# Patient Record
Sex: Female | Born: 1950 | Race: White | Hispanic: No | Marital: Married | Smoking: Never smoker
Health system: Southern US, Community
[De-identification: ages and names within clinical notes are randomized; demographics above are authoritative.]

## PROBLEM LIST (undated history)

## (undated) DIAGNOSIS — B009 Herpesviral infection, unspecified: Secondary | ICD-10-CM

## (undated) DIAGNOSIS — K219 Gastro-esophageal reflux disease without esophagitis: Secondary | ICD-10-CM

## (undated) DIAGNOSIS — M1990 Unspecified osteoarthritis, unspecified site: Secondary | ICD-10-CM

## (undated) DIAGNOSIS — E785 Hyperlipidemia, unspecified: Secondary | ICD-10-CM

## (undated) DIAGNOSIS — H8109 Meniere's disease, unspecified ear: Secondary | ICD-10-CM

## (undated) HISTORY — DX: Gastro-esophageal reflux disease without esophagitis: K21.9

## (undated) HISTORY — DX: Herpesviral infection, unspecified: B00.9

## (undated) HISTORY — DX: Hyperlipidemia, unspecified: E78.5

## (undated) HISTORY — PX: TONSILLECTOMY: SUR1361

---

## 2013-08-01 ENCOUNTER — Ambulatory Visit (INDEPENDENT_AMBULATORY_CARE_PROVIDER_SITE_OTHER): Payer: BC Managed Care – PPO | Admitting: Internal Medicine

## 2013-08-01 ENCOUNTER — Encounter: Payer: Self-pay | Admitting: Internal Medicine

## 2013-08-01 VITALS — BP 114/60 | HR 83 | Temp 98.3°F | Ht 58.5 in | Wt 148.8 lb

## 2013-08-01 DIAGNOSIS — F411 Generalized anxiety disorder: Secondary | ICD-10-CM | POA: Insufficient documentation

## 2013-08-01 DIAGNOSIS — H8109 Meniere's disease, unspecified ear: Secondary | ICD-10-CM

## 2013-08-01 DIAGNOSIS — I10 Essential (primary) hypertension: Secondary | ICD-10-CM | POA: Insufficient documentation

## 2013-08-01 DIAGNOSIS — E785 Hyperlipidemia, unspecified: Secondary | ICD-10-CM

## 2013-08-01 DIAGNOSIS — E669 Obesity, unspecified: Secondary | ICD-10-CM

## 2013-08-01 DIAGNOSIS — K219 Gastro-esophageal reflux disease without esophagitis: Secondary | ICD-10-CM | POA: Insufficient documentation

## 2013-08-01 MED ORDER — ROSUVASTATIN CALCIUM 20 MG PO TABS: 20.0000 mg | ORAL_TABLET | Freq: Every day | ORAL | Status: DC

## 2013-08-01 MED ORDER — HYDROCHLOROTHIAZIDE 25 MG PO TABS
25.0000 mg | ORAL_TABLET | Freq: Every day | ORAL | Status: DC
Start: 2013-08-01 — End: 2014-02-02

## 2013-08-01 NOTE — Assessment & Plan Note (Signed)
Encouraged her to work on diet and exercise 

## 2013-08-01 NOTE — Assessment & Plan Note (Signed)
Controlled on HCTZ and valium She request refill of HCTZ today

## 2013-08-01 NOTE — Assessment & Plan Note (Signed)
No issues on crestor She request refill today

## 2013-08-01 NOTE — Patient Instructions (Addendum)
Fat and Cholesterol Control Diet  Fat and cholesterol levels in your blood and organs are influenced by your diet. High levels of fat and cholesterol may lead to diseases of the heart, small and large blood vessels, gallbladder, liver, and pancreas.  CONTROLLING FAT AND CHOLESTEROL WITH DIET  Although exercise and lifestyle factors are important, your diet is key. That is because certain foods are known to raise cholesterol and others to lower it. The goal is to balance foods for their effect on cholesterol and more importantly, to replace saturated and trans fat with other types of fat, such as monounsaturated fat, polyunsaturated fat, and omega-3 fatty acids.  On average, a person should consume no more than 15 to 17 g of saturated fat daily. Saturated and trans fats are considered "bad" fats, and they will raise LDL cholesterol. Saturated fats are primarily found in animal products such as meats, butter, and cream. However, that does not mean you need to give up all your favorite foods. Today, there are good tasting, low-fat, low-cholesterol substitutes for most of the things you like to eat. Choose low-fat or nonfat alternatives. Choose round or loin cuts of red meat. These types of cuts are lowest in fat and cholesterol. Chicken (without the skin), fish, veal, and ground turkey breast are great choices. Eliminate fatty meats, such as hot dogs and salami. Even shellfish have little or no saturated fat. Have a 3 oz (85 g) portion when you eat lean meat, poultry, or fish.  Trans fats are also called "partially hydrogenated oils." They are oils that have been scientifically manipulated so that they are solid at room temperature resulting in a longer shelf life and improved taste and texture of foods in which they are added. Trans fats are found in stick margarine, some tub margarines, cookies, crackers, and baked goods.   When baking and cooking, oils are a great substitute for butter. The monounsaturated oils are  especially beneficial since it is believed they lower LDL and raise HDL. The oils you should avoid entirely are saturated tropical oils, such as coconut and palm.   Remember to eat a lot from food groups that are naturally free of saturated and trans fat, including fish, fruit, vegetables, beans, grains (barley, rice, couscous, bulgur wheat), and pasta (without cream sauces).   IDENTIFYING FOODS THAT LOWER FAT AND CHOLESTEROL   Soluble fiber may lower your cholesterol. This type of fiber is found in fruits such as apples, vegetables such as broccoli, potatoes, and carrots, legumes such as beans, peas, and lentils, and grains such as barley. Foods fortified with plant sterols (phytosterol) may also lower cholesterol. You should eat at least 2 g per day of these foods for a cholesterol lowering effect.   Read package labels to identify low-saturated fats, trans fat free, and low-fat foods at the supermarket. Select cheeses that have only 2 to 3 g saturated fat per ounce. Use a heart-healthy tub margarine that is free of trans fats or partially hydrogenated oil. When buying baked goods (cookies, crackers), avoid partially hydrogenated oils. Breads and muffins should be made from whole grains (whole-wheat or whole oat flour, instead of "flour" or "enriched flour"). Buy non-creamy canned soups with reduced salt and no added fats.   FOOD PREPARATION TECHNIQUES   Never deep-fry. If you must fry, either stir-fry, which uses very little fat, or use non-stick cooking sprays. When possible, broil, bake, or roast meats, and steam vegetables. Instead of putting butter or margarine on vegetables, use lemon   and herbs, applesauce, and cinnamon (for squash and sweet potatoes). Use nonfat yogurt, salsa, and low-fat dressings for salads.   LOW-SATURATED FAT / LOW-FAT FOOD SUBSTITUTES  Meats / Saturated Fat (g)  · Avoid: Steak, marbled (3 oz/85 g) / 11 g  · Choose: Steak, lean (3 oz/85 g) / 4 g  · Avoid: Hamburger (3 oz/85 g) / 7  g  · Choose: Hamburger, lean (3 oz/85 g) / 5 g  · Avoid: Ham (3 oz/85 g) / 6 g  · Choose: Ham, lean cut (3 oz/85 g) / 2.4 g  · Avoid: Chicken, with skin, dark meat (3 oz/85 g) / 4 g  · Choose: Chicken, skin removed, dark meat (3 oz/85 g) / 2 g  · Avoid: Chicken, with skin, light meat (3 oz/85 g) / 2.5 g  · Choose: Chicken, skin removed, light meat (3 oz/85 g) / 1 g  Dairy / Saturated Fat (g)  · Avoid: Whole milk (1 cup) / 5 g  · Choose: Low-fat milk, 2% (1 cup) / 3 g  · Choose: Low-fat milk, 1% (1 cup) / 1.5 g  · Choose: Skim milk (1 cup) / 0.3 g  · Avoid: Hard cheese (1 oz/28 g) / 6 g  · Choose: Skim milk cheese (1 oz/28 g) / 2 to 3 g  · Avoid: Cottage cheese, 4% fat (1 cup) / 6.5 g  · Choose: Low-fat cottage cheese, 1% fat (1 cup) / 1.5 g  · Avoid: Ice cream (1 cup) / 9 g  · Choose: Sherbet (1 cup) / 2.5 g  · Choose: Nonfat frozen yogurt (1 cup) / 0.3 g  · Choose: Frozen fruit bar / trace  · Avoid: Whipped cream (1 tbs) / 3.5 g  · Choose: Nondairy whipped topping (1 tbs) / 1 g  Condiments / Saturated Fat (g)  · Avoid: Mayonnaise (1 tbs) / 2 g  · Choose: Low-fat mayonnaise (1 tbs) / 1 g  · Avoid: Butter (1 tbs) / 7 g  · Choose: Extra light margarine (1 tbs) / 1 g  · Avoid: Coconut oil (1 tbs) / 11.8 g  · Choose: Olive oil (1 tbs) / 1.8 g  · Choose: Corn oil (1 tbs) / 1.7 g  · Choose: Safflower oil (1 tbs) / 1.2 g  · Choose: Sunflower oil (1 tbs) / 1.4 g  · Choose: Soybean oil (1 tbs) / 2.4 g  · Choose: Canola oil (1 tbs) / 1 g  Document Released: 02/20/2005 Document Revised: 06/17/2012 Document Reviewed: 08/11/2010  ExitCare® Patient Information ©2014 [employer].

## 2013-08-01 NOTE — Assessment & Plan Note (Signed)
Well controlled on prilosec Continue for now

## 2013-08-01 NOTE — Progress Notes (Signed)
HPI  Pt presents to the clinic today to establish care. She recently moved from [place].  Flu: 12/2012 Tetanus: more than 10 years ago Pap Smear: 12/2012-normal Mammogram: 01/2013 Colon Screening: 2011 Eye Doctor: yearly Dentist: biannually  Meniere's disease: Tends to get dizzy after long car rides. On HCTZ. Takes supplemental valium if needed.  HLD: No issues on crestor and zetia   GERD: Well controlled on prilosec. She did stop taking it for a few days and her reflux did get worse. It has been better since she restarted it.    Past Medical History  Diagnosis Date  . GERD (gastroesophageal reflux disease)   . Hyperlipidemia   . Menetrier disease     Current Outpatient Prescriptions  Medication Sig Dispense Refill  . Biotin 5000 MCG CAPS Take 1 capsule by mouth daily.      . Cholecalciferol (VITAMIN D) 2000 UNITS CAPS Take 1 capsule by mouth daily.      . diazepam (VALIUM) 2 MG tablet Take 2 mg by mouth as needed for anxiety.      Marland Kitchen ezetimibe (ZETIA) 10 MG tablet Take 10 mg by mouth daily.      . hydrochlorothiazide (HYDRODIURIL) 25 MG tablet Take 25 mg by mouth daily.      Marland Kitchen omeprazole (PRILOSEC) 20 MG capsule Take 20 mg by mouth daily.      . rosuvastatin (CRESTOR) 20 MG tablet Take 20 mg by mouth daily. 1/2 tablet daily       No current facility-administered medications for this visit.    No Known Allergies  Family History  Problem Relation Age of Onset  . Arthritis Mother   . Hyperlipidemia Mother   . Hyperlipidemia Father     History   Social History  . Marital Status: Married    Spouse Name: N/A    Number of Children: N/A  . Years of Education: N/A   Occupational History  . Not on file.   Social History Main Topics  . Smoking status: Never Smoker   . Smokeless tobacco: Not on file  . Alcohol Use: Yes     Comment: moderate  . Drug Use: Not on file  . Sexual Activity: Not on file   Other Topics Concern  . Not on file   Social History  Narrative  . No narrative on file    ROS:  Constitutional: Denies fever, malaise, fatigue, headache or abrupt weight changes.  HEENT: Denies eye pain, eye redness, ear pain, ringing in the ears, wax buildup, runny nose, nasal congestion, bloody nose, or sore throat. Respiratory: Denies difficulty breathing, shortness of breath, cough or sputum production.   Cardiovascular: Denies chest pain, chest tightness, palpitations or swelling in the hands or feet.  Gastrointestinal: Denies abdominal pain, bloating, constipation, diarrhea or blood in the stool.  Neurological: Denies dizziness, difficulty with memory, difficulty with speech or problems with balance and coordination.   No other specific complaints in a complete review of systems (except as listed in HPI above).  PE:  BP 114/60  Pulse 83  Temp(Src) 98.3 F (36.8 C) (Oral)  Ht 4' 10.5" (1.486 m)  Wt 148 lb 12 oz (67.473 kg)  BMI 30.56 kg/m2  SpO2 98% Wt Readings from Last 3 Encounters:  08/01/13 148 lb 12 oz (67.473 kg)    General: Appears her stated age, well developed, well nourished in NAD. Cardiovascular: Normal rate and rhythm. S1,S2 noted.  No murmur, rubs or gallops noted. No JVD or BLE edema. No  carotid bruits noted. Pulmonary/Chest: Normal effort and positive vesicular breath sounds. No respiratory distress. No wheezes, rales or ronchi noted.  Abdomen: Soft and nontender. Normal bowel sounds, no bruits noted. No distention or masses noted. Liver, spleen and kidneys non palpable.  Assessment and Plan:

## 2013-08-01 NOTE — Progress Notes (Signed)
Pre visit review using our clinic review tool, if applicable. No additional management support is needed unless otherwise documented below in the visit note. 

## 2013-11-06 ENCOUNTER — Other Ambulatory Visit: Payer: Self-pay

## 2013-11-06 DIAGNOSIS — E785 Hyperlipidemia, unspecified: Secondary | ICD-10-CM

## 2013-11-06 DIAGNOSIS — Z79899 Other long term (current) drug therapy: Secondary | ICD-10-CM

## 2013-11-06 NOTE — Telephone Encounter (Signed)
i do not see where you have filled this before--but pt was was on Crestor has not had a refill in 2 months--please advise

## 2013-11-07 NOTE — Telephone Encounter (Signed)
Left message on voicemail.

## 2013-11-07 NOTE — Telephone Encounter (Signed)
Call pt and ask her if she is taking her crestor as well. If so, refill x 30 days then she will need to come back in 4-6 weeks for lipid panel. Ok to order future lipid and CMEt

## 2013-11-12 MED ORDER — EZETIMIBE 10 MG PO TABS: 10.0000 mg | ORAL_TABLET | Freq: Every day | ORAL | Status: DC

## 2013-11-14 NOTE — Telephone Encounter (Signed)
Spoke to pt and she stated she is in fact still taking crestor and will call to schedule a lab appt at her convenience--will future order labs

## 2013-11-14 NOTE — Addendum Note (Signed)
Addended by: Lurlean Nanny on: 11/14/2013 02:01 PM   Modules accepted: Orders

## 2014-01-02 ENCOUNTER — Other Ambulatory Visit (INDEPENDENT_AMBULATORY_CARE_PROVIDER_SITE_OTHER): Payer: BC Managed Care – PPO

## 2014-01-02 ENCOUNTER — Institutional Professional Consult (permissible substitution) (INDEPENDENT_AMBULATORY_CARE_PROVIDER_SITE_OTHER): Payer: BC Managed Care – PPO | Admitting: Internal Medicine

## 2014-01-02 DIAGNOSIS — E785 Hyperlipidemia, unspecified: Secondary | ICD-10-CM

## 2014-01-02 DIAGNOSIS — Z79899 Other long term (current) drug therapy: Secondary | ICD-10-CM

## 2014-01-02 LAB — COMPREHENSIVE METABOLIC PANEL
ALT: 112 U/L — ABNORMAL HIGH (ref 0–35)
AST: 76 U/L — AB (ref 0–37)
Albumin: 3.8 g/dL (ref 3.5–5.2)
Alkaline Phosphatase: 43 U/L (ref 39–117)
BUN: 23 mg/dL (ref 6–23)
CALCIUM: 9.6 mg/dL (ref 8.4–10.5)
CHLORIDE: 103 meq/L (ref 96–112)
CO2: 25 mEq/L (ref 19–32)
Creatinine, Ser: 0.9 mg/dL (ref 0.4–1.2)
GFR: 70.82 mL/min (ref >60.00)
Glucose, Bld: 96 mg/dL (ref 70–99)
Potassium: 3.5 mEq/L (ref 3.5–5.1)
Sodium: 136 mEq/L (ref 135–145)
TOTAL PROTEIN: 7.4 g/dL (ref 6.0–8.3)
Total Bilirubin: 0.7 mg/dL (ref 0.2–1.2)

## 2014-01-02 LAB — LIPID PANEL
CHOLESTEROL: 155 mg/dL (ref 0–200)
HDL: 65.7 mg/dL (ref >39.00)
NonHDL: 89.3
Total CHOL/HDL Ratio: 2
Triglycerides: 251 mg/dL — ABNORMAL HIGH (ref 0.0–149.0)
VLDL: 50.2 mg/dL — ABNORMAL HIGH (ref 0.0–40.0)

## 2014-01-02 LAB — LDL CHOLESTEROL, DIRECT: Direct LDL: 65 mg/dL

## 2014-01-06 ENCOUNTER — Other Ambulatory Visit: Payer: Self-pay | Admitting: Internal Medicine

## 2014-01-06 ENCOUNTER — Telehealth: Payer: Self-pay

## 2014-01-06 DIAGNOSIS — Z23 Encounter for immunization: Secondary | ICD-10-CM

## 2014-01-06 MED ORDER — ZOSTER VACCINE LIVE 19400 UNT/0.65ML ~~LOC~~ SOLR: 0.6500 mL | Freq: Once | SUBCUTANEOUS | Status: DC

## 2014-01-06 NOTE — Telephone Encounter (Signed)
Ok to [employer] for me to sign RB

## 2014-01-06 NOTE — Addendum Note (Signed)
Addended by: Lurlean Nanny on: 01/06/2014 03:48 PM   Modules accepted: Orders, Medications

## 2014-01-06 NOTE — Telephone Encounter (Signed)
Pt request prescription for shingles vaccine sent to [facility];pt has checked with her ins co about coverage for shingles vaccine.

## 2014-01-07 NOTE — Telephone Encounter (Signed)
[facility]

## 2014-02-02 ENCOUNTER — Ambulatory Visit (INDEPENDENT_AMBULATORY_CARE_PROVIDER_SITE_OTHER): Payer: BC Managed Care – PPO | Admitting: Internal Medicine

## 2014-02-02 ENCOUNTER — Encounter: Payer: Self-pay | Admitting: Internal Medicine

## 2014-02-02 VITALS — BP 138/78 | HR 87 | Temp 98.2°F | Wt 150.0 lb

## 2014-02-02 DIAGNOSIS — H8109 Meniere's disease, unspecified ear: Secondary | ICD-10-CM

## 2014-02-02 DIAGNOSIS — J309 Allergic rhinitis, unspecified: Secondary | ICD-10-CM

## 2014-02-02 MED ORDER — HYDROCHLOROTHIAZIDE 25 MG PO TABS: 25.0000 mg | ORAL_TABLET | Freq: Every day | ORAL | Status: DC

## 2014-02-02 MED ORDER — FLUTICASONE-SALMETEROL 100-50 MCG/DOSE IN AEPB: 1.0000 | INHALATION_SPRAY | Freq: Two times a day (BID) | RESPIRATORY_TRACT | Status: DC

## 2014-02-02 MED ORDER — MOMETASONE FUROATE 50 MCG/ACT NA SUSP: 2.0000 | Freq: Every day | NASAL | Status: DC

## 2014-02-02 NOTE — Patient Instructions (Signed)

## 2014-02-02 NOTE — Progress Notes (Signed)
Pre visit review using our clinic review tool, if applicable. No additional management support is needed unless otherwise documented below in the visit note. 

## 2014-02-02 NOTE — Progress Notes (Signed)
HPI  Pt presents to to the clinic today with c/o cough, nasal congestion and post nasal drainage. She reports this started 2 weeks ago. The cough is productive of clear mucous. The cough is worse in the morning. She denies fever, chills or body aches. She has tried Benadryl, Neti Pot and Advair inhaler.  Additionally, she needs a refill of her HCTZ. She takes this for Menieres disease. She has not had any recent episodes of dizziness.  Review of Systems    Past Medical History  Diagnosis Date  . GERD (gastroesophageal reflux disease)   . Hyperlipidemia   . Menetrier disease     Family History  Problem Relation Age of Onset  . Arthritis Mother   . Hyperlipidemia Mother   . Hyperlipidemia Father     History   Social History  . Marital Status: Married    Spouse Name: N/A    Number of Children: N/A  . Years of Education: N/A   Occupational History  . Not on file.   Social History Main Topics  . Smoking status: Never Smoker   . Smokeless tobacco: Not on file  . Alcohol Use: Yes     Comment: moderate  . Drug Use: Not on file  . Sexual Activity: Not on file   Other Topics Concern  . Not on file   Social History Narrative    No Known Allergies   Constitutional:  Denies headache, fatigue, fever or abrupt weight changes.  HEENT:  Positive facial pain, nasal congestion and sore throat. Denies eye redness, ear pain, ringing in the ears, wax buildup, runny nose or bloody nose. Respiratory: Positive cough. Denies difficulty breathing or shortness of breath.  Cardiovascular: Denies chest pain, chest tightness, palpitations or swelling in the hands or feet.   No other specific complaints in a complete review of systems (except as listed in HPI above).  Objective:  BP 138/78 mmHg  Pulse 87  Temp(Src) 98.2 F (36.8 C) (Oral)  Wt 150 lb (68.04 kg)  SpO2 97%   General: Appears her stated age, well developed, well nourished in NAD. HEENT: Head: normal shape and size,  maxillary sinus tenderness noted; Ears: Tm's pink but intact, normal light reflex; Nose: mucosa pink and moist, septum midline; Throat/Mouth: + PND. Teeth present, mucosa erythematous and moist, no exudate noted, no lesions or ulcerations noted.  Neck: No adenopathy noted. Cardiovascular: Normal rate and rhythm. S1,S2 noted.  No murmur, rubs or gallops noted. Pulmonary/Chest: Normal effort and positive vesicular breath sounds. No respiratory distress. No wheezes, rales or ronchi noted.      Assessment & Plan:   Allergic Rhintis  eRx for Nasonex Refilled Advair Pick up some Claritin OTC She declines RX for pred taper today Watch for fever, colored drainage or worsening facial pain  Menieres disease:  Refilled HCTZ  RTC as needed or if symptoms persist.

## 2014-02-11 ENCOUNTER — Other Ambulatory Visit: Payer: Self-pay

## 2014-02-11 MED ORDER — EZETIMIBE 10 MG PO TABS: 10.0000 mg | ORAL_TABLET | Freq: Every day | ORAL | Status: DC

## 2014-02-26 ENCOUNTER — Other Ambulatory Visit: Payer: Self-pay | Admitting: Internal Medicine

## 2014-02-26 NOTE — Telephone Encounter (Signed)
Nevermind. She was given a 90 day supply with 1 refill. Will send refill in.

## 2014-02-26 NOTE — Telephone Encounter (Signed)
Yes she should be taking it, can we call her to see why she hasn't needed a refill until now?

## 2014-02-26 NOTE — Telephone Encounter (Signed)
Last filled 08/01/13 with 1 refill--please advise if pt should still be taking medication

## 2014-03-31 ENCOUNTER — Ambulatory Visit (INDEPENDENT_AMBULATORY_CARE_PROVIDER_SITE_OTHER): Payer: BC Managed Care – PPO | Admitting: Internal Medicine

## 2014-03-31 ENCOUNTER — Encounter: Payer: Self-pay | Admitting: Internal Medicine

## 2014-03-31 VITALS — BP 118/70 | HR 88 | Temp 98.4°F | Wt 146.0 lb

## 2014-03-31 DIAGNOSIS — J069 Acute upper respiratory infection, unspecified: Secondary | ICD-10-CM

## 2014-03-31 MED ORDER — AZITHROMYCIN 250 MG PO TABS: ORAL_TABLET | ORAL | Status: DC

## 2014-03-31 NOTE — Progress Notes (Signed)
HPI  Ms. Duecker is a 64 y.o. female with c/o non-productive cough x 5 days and right ear fullness x 2 days. She's been coughing so hard that she gets dizzy.Cough has worsened over time - more frequent and exhausting. She feels run down. She has tried OTC Robitussin, Claritin, Benadryl and Nasonex w/ no relief. She's had multiple sick contacts through large conferences at work. Denies sore throat, fever, chest pain, difficulty breathing, vomiting or abdominal pain.   Review of Systems   Past Medical History  Diagnosis Date  . GERD (gastroesophageal reflux disease)   . Hyperlipidemia   . Menetrier disease     Family History  Problem Relation Age of Onset  . Arthritis Mother   . Hyperlipidemia Mother   . Hyperlipidemia Father     History   Social History  . Marital Status: Married    Spouse Name: N/A    Number of Children: N/A  . Years of Education: N/A   Occupational History  . Not on file.   Social History Main Topics  . Smoking status: Never Smoker   . Smokeless tobacco: Not on file  . Alcohol Use: Yes     Comment: moderate  . Drug Use: Not on file  . Sexual Activity: Not on file   Other Topics Concern  . Not on file   Social History Narrative    No Known Allergies   Constitutional: Positive fatigue. Denies headache, fever, nausea, vomiting or abrupt weight changes.  HEENT:  Positive ear fullness. Denies sore throat, eye redness, eye pain, pressure behind the eyes, facial pain, nasal congestion, ear pain, ringing in the ears, wax buildup, runny nose or bloody nose. Respiratory: Positive cough. Denies difficulty breathing or shortness of breath.  Cardiovascular: Denies chest pain, chest tightness, palpitations or swelling in the hands or feet.   No other specific complaints in a complete review of systems (except as listed in HPI above).  Objective:   BP 118/70 mmHg  Pulse 88  Temp(Src) 98.4 F (36.9 C) (Oral)  Wt 146 lb (66.225 kg)  SpO2 98% Wt Readings  from Last 3 Encounters:  03/31/14 146 lb (66.225 kg)  02/02/14 150 lb (68.04 kg)  08/01/13 148 lb 12 oz (67.473 kg)    General: Ms. Shimkus is coughing continuously and appears tired, but in NAD. HEENT: Head: normal shape and size; Eyes: sclera white, no icterus, conjunctiva pink, PERRLA and EOMs intact; Ears: Tm's pink but intact, normal light reflex; Nose: mucosa pink and moist, septum midline; Throat/Mouth: + PND. Teeth present, mucosa erythematous and moist, no exudate noted, no lesions or ulcerations noted.  Neck: No adenopathy noted.  Cardiovascular: Normal rate and rhythm. S1,S2 noted.  No murmur, rubs or gallops noted.  Pulmonary/Chest: Normal effort and positive vesicular breath sounds. No respiratory distress. No wheezes, rales or ronchi noted.      Assessment & Plan:   Upper Respiratory Infection:  Get some rest and drink plenty of water Ibuprofen as needed eRx for Azithromax x 5 days Delsym OTC for cough  RTC as needed or if symptoms persist.

## 2014-03-31 NOTE — Patient Instructions (Signed)
Upper Respiratory Infection, Adult An upper respiratory infection (URI) is also sometimes known as the common cold. The upper respiratory tract includes the nose, sinuses, throat, trachea, and bronchi. Bronchi are the airways leading to the lungs. Most people improve within 1 week, but symptoms can last up to 2 weeks. A residual cough may last even longer.  CAUSES Many different viruses can infect the tissues lining the upper respiratory tract. The tissues become irritated and inflamed and often become very moist. Mucus production is also common. A cold is contagious. You can easily spread the virus to others by oral contact. This includes kissing, sharing a glass, coughing, or sneezing. Touching your mouth or nose and then touching a surface, which is then touched by another person, can also spread the virus. SYMPTOMS  Symptoms typically develop 1 to 3 days after you come in contact with a cold virus. Symptoms vary from person to person. They may include:  Runny nose.  Sneezing.  Nasal congestion.  Sinus irritation.  Sore throat.  Loss of voice (laryngitis).  Cough.  Fatigue.  Muscle aches.  Loss of appetite.  Headache.  Low-grade fever. DIAGNOSIS  You might diagnose your own cold based on familiar symptoms, since most people get a cold 2 to 3 times a year. Your caregiver can confirm this based on your exam. Most importantly, your caregiver can check that your symptoms are not due to another disease such as strep throat, sinusitis, pneumonia, asthma, or epiglottitis. Blood tests, throat tests, and X-rays are not necessary to diagnose a common cold, but they may sometimes be helpful in excluding other more serious diseases. Your caregiver will decide if any further tests are required. RISKS AND COMPLICATIONS  You may be at risk for a more severe case of the common cold if you smoke cigarettes, have chronic heart disease (such as heart failure) or lung disease (such as asthma), or if  you have a weakened immune system. The very young and very old are also at risk for more serious infections. Bacterial sinusitis, middle ear infections, and bacterial pneumonia can complicate the common cold. The common cold can worsen asthma and chronic obstructive pulmonary disease (COPD). Sometimes, these complications can require emergency medical care and may be life-threatening. PREVENTION  The best way to protect against getting a cold is to practice good hygiene. Avoid oral or hand contact with people with cold symptoms. Wash your hands often if contact occurs. There is no clear evidence that vitamin C, vitamin E, echinacea, or exercise reduces the chance of developing a cold. However, it is always recommended to get plenty of rest and practice good nutrition. TREATMENT  Treatment is directed at relieving symptoms. There is no cure. Antibiotics are not effective, because the infection is caused by a virus, not by bacteria. Treatment may include:  Increased fluid intake. Sports drinks offer valuable electrolytes, sugars, and fluids.  Breathing heated mist or steam (vaporizer or shower).  Eating chicken soup or other clear broths, and maintaining good nutrition.  Getting plenty of rest.  Using gargles or lozenges for comfort.  Controlling fevers with ibuprofen or acetaminophen as directed by your caregiver.  Increasing usage of your inhaler if you have asthma. Zinc gel and zinc lozenges, taken in the first 24 hours of the common cold, can shorten the duration and lessen the severity of symptoms. Pain medicines may help with fever, muscle aches, and throat pain. A variety of non-prescription medicines are available to treat congestion and runny nose. Your caregiver   can make recommendations and may suggest nasal or lung inhalers for other symptoms.  HOME CARE INSTRUCTIONS   Only take over-the-counter or prescription medicines for pain, discomfort, or fever as directed by your  caregiver.  Use a warm mist humidifier or inhale steam from a shower to increase air moisture. This may keep secretions moist and make it easier to breathe.  Drink enough water and fluids to keep your urine clear or pale yellow.  Rest as needed.  Return to work when your temperature has returned to normal or as your caregiver advises. You may need to stay home longer to avoid infecting others. You can also use a face mask and careful hand washing to prevent spread of the virus. SEEK MEDICAL CARE IF:   After the first few days, you feel you are getting worse rather than better.  You need your caregiver's advice about medicines to control symptoms.  You develop chills, worsening shortness of breath, or brown or red sputum. These may be signs of pneumonia.  You develop yellow or brown nasal discharge or pain in the face, especially when you bend forward. These may be signs of sinusitis.  You develop a fever, swollen neck glands, pain with swallowing, or white areas in the back of your throat. These may be signs of strep throat. SEEK IMMEDIATE MEDICAL CARE IF:   You have a fever.  You develop severe or persistent headache, ear pain, sinus pain, or chest pain.  You develop wheezing, a prolonged cough, cough up blood, or have a change in your usual mucus (if you have chronic lung disease).  You develop sore muscles or a stiff neck. Document Released: 08/16/2000 Document Revised: 05/15/2011 Document Reviewed: 05/28/2013 ExitCare Patient Information 2015 [employer]. This information is not intended to replace advice given to you by your health care provider. Make sure you discuss any questions you have with your health care provider.  

## 2014-03-31 NOTE — Progress Notes (Signed)
Pre visit review using our clinic review tool, if applicable. No additional management support is needed unless otherwise documented below in the visit note. 

## 2014-06-02 ENCOUNTER — Other Ambulatory Visit: Payer: Self-pay | Admitting: Internal Medicine

## 2014-07-25 ENCOUNTER — Other Ambulatory Visit: Payer: Self-pay | Admitting: Internal Medicine

## 2014-07-29 ENCOUNTER — Other Ambulatory Visit: Payer: Self-pay

## 2014-07-29 MED ORDER — DIAZEPAM 2 MG PO TABS: 2.0000 mg | ORAL_TABLET | ORAL | Status: DC | PRN

## 2014-07-29 NOTE — Telephone Encounter (Signed)
Pt left v/m; pt flying to [place] on 08/01/14; pt has meniere's disease and sometimes when flying long periods of time pt gets sick and request refill of diazepam. Pt request cb[facility].pt established care 08/01/13 and last sick visit was 03/31/14.Please advise.

## 2014-07-29 NOTE — Telephone Encounter (Signed)
Ok to phone in valium

## 2014-07-30 NOTE — Telephone Encounter (Signed)
Rx called in to pharmacy. 

## 2014-08-17 ENCOUNTER — Other Ambulatory Visit: Payer: Self-pay | Admitting: Internal Medicine

## 2014-08-27 ENCOUNTER — Other Ambulatory Visit: Payer: Self-pay | Admitting: Internal Medicine

## 2014-08-28 ENCOUNTER — Other Ambulatory Visit: Payer: Self-pay | Admitting: Internal Medicine

## 2014-08-30 ENCOUNTER — Other Ambulatory Visit: Payer: Self-pay | Admitting: Internal Medicine

## 2014-09-24 ENCOUNTER — Telehealth: Payer: Self-pay

## 2014-09-24 NOTE — Telephone Encounter (Signed)
Left a message for patient to return my call about having a Mammogram set up. Will await call back.  

## 2014-10-05 ENCOUNTER — Telehealth: Payer: Self-pay | Admitting: Internal Medicine

## 2014-10-05 NOTE — Telephone Encounter (Signed)
Patient returned Monica's call about getting a mammogram.

## 2014-10-05 NOTE — Telephone Encounter (Signed)
Spoke to patient regarding overdue mammogram.  Patient states her last one was done over two years ago in [place] prior to moving to [place].  Patient is requesting contact information to breast center so that she can call and schedule.  Number given for [facility] in [place].  Patient will call and schedule.

## 2014-10-22 ENCOUNTER — Other Ambulatory Visit: Payer: Self-pay | Admitting: Internal Medicine

## 2014-11-03 ENCOUNTER — Encounter: Payer: BC Managed Care – PPO | Admitting: Obstetrics & Gynecology

## 2014-11-10 ENCOUNTER — Ambulatory Visit (INDEPENDENT_AMBULATORY_CARE_PROVIDER_SITE_OTHER): Payer: BC Managed Care – PPO | Admitting: Obstetrics & Gynecology

## 2014-11-10 ENCOUNTER — Encounter: Payer: Self-pay | Admitting: Obstetrics & Gynecology

## 2014-11-10 ENCOUNTER — Other Ambulatory Visit: Payer: Self-pay | Admitting: Internal Medicine

## 2014-11-10 VITALS — BP 129/78 | HR 61 | Resp 18 | Ht 59.0 in | Wt 136.0 lb

## 2014-11-10 DIAGNOSIS — Z23 Encounter for immunization: Secondary | ICD-10-CM

## 2014-11-10 DIAGNOSIS — Z1231 Encounter for screening mammogram for malignant neoplasm of breast: Secondary | ICD-10-CM

## 2014-11-10 DIAGNOSIS — Z01419 Encounter for gynecological examination (general) (routine) without abnormal findings: Secondary | ICD-10-CM | POA: Diagnosis not present

## 2014-11-10 DIAGNOSIS — Z124 Encounter for screening for malignant neoplasm of cervix: Secondary | ICD-10-CM | POA: Diagnosis not present

## 2014-11-10 DIAGNOSIS — Z113 Encounter for screening for infections with a predominantly sexual mode of transmission: Secondary | ICD-10-CM

## 2014-11-10 DIAGNOSIS — Z1151 Encounter for screening for human papillomavirus (HPV): Secondary | ICD-10-CM

## 2014-11-10 DIAGNOSIS — Z0000 Encounter for general adult medical examination without abnormal findings: Secondary | ICD-10-CM

## 2014-11-10 NOTE — Progress Notes (Signed)
Subjective:    Vanessa Walton is a 64 y.o. MW P2 (daughter 25 yo and 70 yo son, 3 grands) female who presents for an annual exam. The patient has no complaints today. The patient is sexually active. GYN screening history: last pap: was normal. The patient wears seatbelts: yes. The patient participates in regular exercise: yes. Has the patient ever been transfused or tattooed?: no. The patient reports that there is not domestic violence in her life.   Menstrual History: OB History    No data available      Menarche age: 67  No LMP recorded. Patient is postmenopausal.    The following portions of the patient's history were reviewed and updated as appropriate: allergies, current medications, past family history, past medical history, past social history, past surgical history and problem list.  Review of Systems A comprehensive review of systems was negative.  Married for 31 years, uses lube with sex, no dyspareunia. 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, jewellery) Mammogram scheduled, colonscopy UTD   Objective:    BP 129/78 mmHg  Pulse 61  Resp 18  Ht 4\' 11"  (1.499 m)  Wt 136 lb (61.689 kg)  BMI 27.45 kg/m2  General Appearance:    Alert, cooperative, no distress, appears stated age  Head:    Normocephalic, without obvious abnormality, atraumatic  Eyes:    PERRL, conjunctiva/corneas clear, EOM's intact, fundi    benign, both eyes  Ears:    Normal TM's and external ear canals, both ears  Nose:   Nares normal, septum midline, mucosa normal, no drainage    or sinus tenderness  Throat:   Lips, mucosa, and tongue normal; teeth and gums normal  Neck:   Supple, symmetrical, trachea midline, no adenopathy;    thyroid:  no enlargement/tenderness/nodules; no carotid   bruit or JVD  Back:     Symmetric, no curvature, ROM normal, no CVA tenderness  Lungs:     Clear to auscultation bilaterally, respirations unlabored   Chest Wall:    No tenderness or deformity   Heart:    Regular rate and rhythm, S1 and S2 normal, no murmur, rub   or gallop  Breast Exam:    No tenderness, masses, or nipple abnormality  Abdomen:     Soft, non-tender, bowel sounds active all four quadrants,    no masses, no organomegaly  Genitalia:    Normal female without lesion, discharge or tenderness, marked atrophy, no masses on bimanual exam, NSSA, NT, mobile     Extremities:   Extremities normal, atraumatic, no cyanosis or edema  Pulses:   2+ and symmetric all extremities  Skin:   Skin color, texture, turgor normal, no rashes or lesions  Lymph nodes:   Cervical, supraclavicular, and axillary nodes normal  Neurologic:   CNII-XII intact, normal strength, sensation and reflexes    throughout  .    Assessment:    Healthy female exam.    Plan:     Breast self exam technique reviewed and patient encouraged to perform self-exam monthly.

## 2014-11-11 LAB — CYTOLOGY - PAP

## 2014-11-30 ENCOUNTER — Other Ambulatory Visit: Payer: Self-pay | Admitting: Internal Medicine

## 2014-12-09 ENCOUNTER — Other Ambulatory Visit: Payer: Self-pay | Admitting: Internal Medicine

## 2014-12-14 ENCOUNTER — Ambulatory Visit
Admission: RE | Admit: 2014-12-14 | Discharge: 2014-12-14 | Disposition: A | Discharge status: Home / Self Care | Payer: BC Managed Care – PPO | Source: Ambulatory Visit | Attending: Internal Medicine | Admitting: Internal Medicine

## 2014-12-14 DIAGNOSIS — Z1231 Encounter for screening mammogram for malignant neoplasm of breast: Secondary | ICD-10-CM | POA: Insufficient documentation

## 2014-12-21 ENCOUNTER — Ambulatory Visit (INDEPENDENT_AMBULATORY_CARE_PROVIDER_SITE_OTHER): Payer: BC Managed Care – PPO | Admitting: Internal Medicine

## 2014-12-21 ENCOUNTER — Encounter: Payer: Self-pay | Admitting: Internal Medicine

## 2014-12-21 VITALS — BP 132/84 | HR 72 | Temp 98.2°F | Wt 136.0 lb

## 2014-12-21 DIAGNOSIS — E785 Hyperlipidemia, unspecified: Secondary | ICD-10-CM | POA: Diagnosis not present

## 2014-12-21 DIAGNOSIS — J0100 Acute maxillary sinusitis, unspecified: Secondary | ICD-10-CM | POA: Diagnosis not present

## 2014-12-21 DIAGNOSIS — M79643 Pain in unspecified hand: Secondary | ICD-10-CM | POA: Diagnosis not present

## 2014-12-21 LAB — COMPREHENSIVE METABOLIC PANEL WITH GFR
ALT: 44 U/L — ABNORMAL HIGH (ref 0–35)
AST: 36 U/L (ref 0–37)
Albumin: 4.4 g/dL (ref 3.5–5.2)
Alkaline Phosphatase: 39 U/L (ref 39–117)
BUN: 12 mg/dL (ref 6–23)
CO2: 30 meq/L (ref 19–32)
Calcium: 9.7 mg/dL (ref 8.4–10.5)
Chloride: 97 meq/L (ref 96–112)
Creatinine, Ser: 0.67 mg/dL (ref 0.40–1.20)
GFR: 94.18 mL/min
Glucose, Bld: 102 mg/dL — ABNORMAL HIGH (ref 70–99)
Potassium: 3.6 meq/L (ref 3.5–5.1)
Sodium: 136 meq/L (ref 135–145)
Total Bilirubin: 0.5 mg/dL (ref 0.2–1.2)
Total Protein: 7.5 g/dL (ref 6.0–8.3)

## 2014-12-21 LAB — LIPID PANEL
CHOLESTEROL: 150 mg/dL (ref 0–200)
HDL: 64.2 mg/dL (ref >39.00)
LDL Cholesterol: 68 mg/dL (ref 0–99)
NONHDL: 85.93
Total CHOL/HDL Ratio: 2
Triglycerides: 89 mg/dL (ref 0.0–149.0)
VLDL: 17.8 mg/dL (ref 0.0–40.0)

## 2014-12-21 LAB — SEDIMENTATION RATE: SED RATE: 54 mm/h — AB (ref 0–22)

## 2014-12-21 MED ORDER — AMOXICILLIN-POT CLAVULANATE 875-125 MG PO TABS: 1.0000 | ORAL_TABLET | Freq: Two times a day (BID) | ORAL | Status: DC

## 2014-12-21 NOTE — Progress Notes (Signed)
Pre visit review using our clinic review tool, if applicable. No additional management support is needed unless otherwise documented below in the visit note. 

## 2014-12-21 NOTE — Progress Notes (Signed)
Subjective:    Patient ID: Vanessa Walton, female    DOB: 1950/10/20, 64 y.o.   MRN: [id number]  HPI  Pt presents to the clinic today with multiple complaints:  She has had a cough x 5 days. The cough is productive of brown, blood tinged mucous. She has had some associated runny nose, ear fullness and post nasal drip. She denies fever, chills or body aches. She has tried an OTC antihistamine and decongestant with minimal relief. She has had sick contacts. She is also flying to go out of town tomorrow and wants to make sure that her symptoms will not get any worse.  She also would like a referral to rheumatology. She has been having joint pain in her hands. She describes the pain as stiffness and aching. She reports her mother and her sister have RA and she has noticed some of the same symptoms.  She is also due to follow up on HLD. She has been taking her Crestor as prescribed. She denies myalgias. She has been trying to consume a low fat diet.  Review of Systems  Past Medical History  Diagnosis Date  . GERD (gastroesophageal reflux disease)   . Hyperlipidemia   . Menetrier disease     Current Outpatient Prescriptions  Medication Sig Dispense Refill  . diazepam (VALIUM) 2 MG tablet Take 1 tablet (2 mg total) by mouth as needed for anxiety. 20 tablet 0  . hydrochlorothiazide (HYDRODIURIL) 25 MG tablet TAKE 1 TABLET (25 MG TOTAL) BY MOUTH DAILY. 30 tablet 0  . Omega-3 Fatty Acids (FISH OIL DOUBLE STRENGTH) 1200 MG CAPS Take by mouth 2 (two) times daily.    . rosuvastatin (CRESTOR) 20 MG tablet Take 1 tablet (20 mg total) by mouth daily. MUST SCHEDULE OFFICE VISIT FOR FURTHER REFILLS [phone] 45 tablet 0  . ZETIA 10 MG tablet TAKE 1 TABLET (10 MG TOTAL) BY MOUTH DAILY. 30 tablet 0  . amoxicillin-clavulanate (AUGMENTIN) 875-125 MG tablet Take 1 tablet by mouth 2 (two) times daily. 20 tablet 0  . Fluticasone-Salmeterol (ADVAIR) 100-50 MCG/DOSE AEPB Inhale 1 puff into the lungs 2 (two)  times daily. (Patient not taking: Reported on 12/21/2014) 1 each 3  . mometasone (NASONEX) 50 MCG/ACT nasal spray Place 2 sprays into the nose daily. (Patient not taking: Reported on 12/21/2014) 17 g 12   No current facility-administered medications for this visit.    No Known Allergies  Family History  Problem Relation Age of Onset  . Arthritis Mother   . Hyperlipidemia Mother   . Hyperlipidemia Father     Social History   Social History  . Marital Status: Married    Spouse Name: N/A  . Number of Children: N/A  . Years of Education: N/A   Occupational History  . Not on file.   Social History Main Topics  . Smoking status: Never Smoker   . Smokeless tobacco: Not on file  . Alcohol Use: Yes     Comment: moderate  . Drug Use: Not on file  . Sexual Activity: Yes    Birth Control/ Protection: None   Other Topics Concern  . Not on file   Social History Narrative     Constitutional: Pt reports headache. Denies fever, malaise, fatigue, or abrupt weight changes.  HEENT: Pt reports runny nose, nasal congestion and ear fullness. Denies eye pain, eye redness, ear pain, ringing in the ears, wax buildup, bloody nose, or sore throat. Respiratory: Pt reports cough. Denies difficulty breathing, shortness of breath,  cough or sputum production.   Cardiovascular: Denies chest pain, chest tightness, palpitations or swelling in the hands or feet.  Musculoskeletal: Pt reports joint pains. Denies decrease in range of motion, difficulty with gait, muscle pain.  Skin: Denies redness, rashes, lesions or ulcercations.  Neurological: Denies dizziness, difficulty with memory, difficulty with speech or problems with balance and coordination.    No other specific complaints in a complete review of systems (except as listed in HPI above).     Objective:   Physical Exam   BP 132/84 mmHg  Pulse 72  Temp(Src) 98.2 F (36.8 C) (Oral)  Wt 136 lb (61.689 kg)  SpO2 98% Wt Readings from Last  3 Encounters:  12/21/14 136 lb (61.689 kg)  11/10/14 136 lb (61.689 kg)  03/31/14 146 lb (66.225 kg)    General: Appears her stated age, well developed, well nourished in NAD. Skin: Warm, dry and intact. No rashes, lesions or ulcerations noted. HEENT: Head: normal shape and size, no sinus tenderness noted; Eyes: sclera white, no icterus, conjunctiva pink; Ears: Tm's gray and intact, normal light reflex; Nose: mucosa boggy and moist, septum midline; Throat/Mouth: Teeth present, mucosa pink and moist, + PND, no exudate, lesions or ulcerations noted.  Neck:  No lymphadenopathy noted. Cardiovascular: Normal rate and rhythm. S1,S2 noted.  No murmur, rubs or gallops noted.  Pulmonary/Chest: Normal effort and positive vesicular breath sounds. No respiratory distress. No wheezes, rales or ronchi noted.  Musculoskeletal: Normal flexion and extension of the fingers. Mild joint enlargement noted. No swelling..  Neurological: Alert and oriented.    BMET    Component Value Date/Time   NA 136 01/02/2014 1355   K 3.5 01/02/2014 1355   CL 103 01/02/2014 1355   CO2 25 01/02/2014 1355   GLUCOSE 96 01/02/2014 1355   BUN 23 01/02/2014 1355   CREATININE 0.9 01/02/2014 1355   CALCIUM 9.6 01/02/2014 1355    Lipid Panel     Component Value Date/Time   CHOL 155 01/02/2014 1355   TRIG 251.0* 01/02/2014 1355   HDL 65.70 01/02/2014 1355   CHOLHDL 2 01/02/2014 1355   VLDL 50.2* 01/02/2014 1355    CBC No results found for: WBC, RBC, HGB, HCT, PLT, MCV, MCH, MCHC, RDW, LYMPHSABS, MONOABS, EOSABS, BASOSABS  Hgb A1C No results found for: HGBA1C      Assessment & Plan:   Acute maxillary sinusitis:  eRx for Augmentin BID x 10 days Delsym as needed for cough   HLD:  Will check CMET and Lipid Profile today Encouraged her to consume a low fat diet  Joint pains in hands:  Will check ESR, ANA, RF Ibuprofen as needed for pain and inflammation  RTC as needed or if symptoms persist or worsen

## 2014-12-21 NOTE — Patient Instructions (Signed)

## 2014-12-22 LAB — RHEUMATOID FACTOR

## 2014-12-22 LAB — ANA: ANA: NEGATIVE

## 2014-12-28 ENCOUNTER — Telehealth: Payer: Self-pay | Admitting: Internal Medicine

## 2014-12-28 NOTE — Telephone Encounter (Signed)
PT returned melanie's call.  Best number to call is [phone]

## 2014-12-29 ENCOUNTER — Other Ambulatory Visit: Payer: Self-pay | Admitting: Internal Medicine

## 2014-12-29 NOTE — Telephone Encounter (Signed)
Does pt need to continue to Zetia?---please advise

## 2014-12-29 NOTE — Telephone Encounter (Signed)
Yes, continue Zetia, refill sent electronically

## 2015-01-13 ENCOUNTER — Other Ambulatory Visit: Payer: Self-pay | Admitting: Internal Medicine

## 2015-01-25 ENCOUNTER — Other Ambulatory Visit: Payer: Self-pay | Admitting: Internal Medicine

## 2015-01-26 NOTE — Telephone Encounter (Signed)
Pt should continue

## 2015-01-26 NOTE — Telephone Encounter (Signed)
Can you verify she is taking daily. No why she gets # 45?

## 2015-01-26 NOTE — Telephone Encounter (Signed)
Please advise if pt should continue

## 2015-01-27 NOTE — Telephone Encounter (Signed)
Left message on voicemail.

## 2015-02-13 ENCOUNTER — Other Ambulatory Visit: Payer: Self-pay | Admitting: Internal Medicine

## 2015-04-01 ENCOUNTER — Other Ambulatory Visit: Payer: Self-pay | Admitting: Internal Medicine

## 2015-07-13 ENCOUNTER — Other Ambulatory Visit: Payer: Self-pay | Admitting: Internal Medicine

## 2015-09-08 ENCOUNTER — Other Ambulatory Visit: Payer: Self-pay

## 2015-09-08 MED ORDER — EZETIMIBE 10 MG PO TABS: ORAL_TABLET | ORAL | Status: DC

## 2015-09-08 NOTE — Telephone Encounter (Signed)
Rx sent electronically.  

## 2015-09-16 ENCOUNTER — Ambulatory Visit (INDEPENDENT_AMBULATORY_CARE_PROVIDER_SITE_OTHER): Payer: BC Managed Care – PPO | Admitting: Internal Medicine

## 2015-09-16 ENCOUNTER — Encounter: Payer: Self-pay | Admitting: Internal Medicine

## 2015-09-16 VITALS — BP 126/78 | HR 78 | Temp 99.2°F | Wt 137.0 lb

## 2015-09-16 DIAGNOSIS — H8103 Meniere's disease, bilateral: Secondary | ICD-10-CM

## 2015-09-16 DIAGNOSIS — H6983 Other specified disorders of Eustachian tube, bilateral: Secondary | ICD-10-CM | POA: Diagnosis not present

## 2015-09-16 DIAGNOSIS — H9193 Unspecified hearing loss, bilateral: Secondary | ICD-10-CM

## 2015-09-16 NOTE — Progress Notes (Signed)
Subjective:    Patient ID: Vanessa Walton, female    DOB: [birth year], 65 y.o.   MRN: [id number]  HPI  Pt presents to the clinic today with c/o bilateral hearing loss. This started 1 month ago. The right ear is worse than the left. She also reports pressure in both of her ears. She denies ear pain or drainage. Sometimes she is able to yawn, and able to get her ears open up. She denies any head trauma or injury to the ears. She has tried Afrin without any relief. She does not take the Nasonex listed on her medication list. She has a history of Meniere's and BPPV disease but she reports this is different.  Review of Systems      Past Medical History  Diagnosis Date  . GERD (gastroesophageal reflux disease)   . Hyperlipidemia   . Menetrier disease     Current Outpatient Prescriptions  Medication Sig Dispense Refill  . diazepam (VALIUM) 2 MG tablet Take 1 tablet (2 mg total) by mouth as needed for anxiety. 20 tablet 0  . ezetimibe (ZETIA) 10 MG tablet TAKE 1 TABLET (10 MG TOTAL) BY MOUTH DAILY. 90 tablet 1  . hydrochlorothiazide (HYDRODIURIL) 25 MG tablet Take 1 tablet (25 mg total) by mouth daily. MUST SCHEDULE ANNUAL EXAM FOR MORE REFILLS 90 tablet 0  . Omega-3 Fatty Acids (FISH OIL DOUBLE STRENGTH) 1200 MG CAPS Take by mouth 2 (two) times daily.    . rosuvastatin (CRESTOR) 20 MG tablet Take 1 tablet (20 mg total) by mouth daily. 30 tablet 5  . Fluticasone-Salmeterol (ADVAIR) 100-50 MCG/DOSE AEPB Inhale 1 puff into the lungs 2 (two) times daily. (Patient not taking: Reported on 12/21/2014) 1 each 3   No current facility-administered medications for this visit.    No Known Allergies  Family History  Problem Relation Age of Onset  . Arthritis Mother   . Hyperlipidemia Mother   . Hyperlipidemia Father     Social History   Social History  . Marital Status: Married    Spouse Name: N/A  . Number of Children: N/A  . Years of Education: N/A   Occupational History  . Not on file.    Social History Main Topics  . Smoking status: Never Smoker   . Smokeless tobacco: Not on file  . Alcohol Use: Yes     Comment: moderate  . Drug Use: Not on file  . Sexual Activity: Yes    Birth Control/ Protection: None   Other Topics Concern  . Not on file   Social History Narrative     Constitutional: Denies fever, malaise, fatigue, headache or abrupt weight changes.  HEENT: Pt reports hearing loss. Denies eye pain, eye redness, ear pain, ringing in the ears, wax buildup, runny nose, nasal congestion, bloody nose, or sore throat. Respiratory: Denies difficulty breathing, shortness of breath, cough or sputum production.   Cardiovascular: Denies chest pain, chest tightness, palpitations or swelling in the hands or feet.  Neurological: Denies dizziness, difficulty with memory, difficulty with speech or problems with balance and coordination.    No other specific complaints in a complete review of systems (except as listed in HPI above).  Objective:   Physical Exam  BP 126/78 mmHg  Pulse 78  Temp(Src) 99.2 F (37.3 C) (Oral)  Wt 137 lb (62.143 kg)  SpO2 98% Wt Readings from Last 3 Encounters:  09/16/15 137 lb (62.143 kg)  12/21/14 136 lb (61.689 kg)  11/10/14 136 lb (61.689 kg)  General: Appears her stated age, well developed, well nourished in NAD. HEENT: Head: normal shape and size, no sinus tenderness noted; Ears: Tm's gray and intact, normal light reflex; Nose: mucosa boggy and moist, septum midline; Throat/Mouth: Teeth present, mucosa erythematous and moist, + PND, no exudate, lesions or ulcerations noted.  Cardiovascular: Normal rate and rhythm. S1,S2 noted.  No murmur, rubs or gallops noted.  Pulmonary/Chest: Normal effort and positive vesicular breath sounds. No respiratory distress. No wheezes, rales or ronchi noted.  Neurological: Alert and oriented. Normal Weber and Rinne bilaterally.   BMET    Component Value Date/Time   NA 136 12/21/2014 1155   K  3.6 12/21/2014 1155   CL 97 12/21/2014 1155   CO2 30 12/21/2014 1155   GLUCOSE 102* 12/21/2014 1155   BUN 12 12/21/2014 1155   CREATININE 0.67 12/21/2014 1155   CALCIUM 9.7 12/21/2014 1155    Lipid Panel     Component Value Date/Time   CHOL 150 12/21/2014 1155   TRIG 89.0 12/21/2014 1155   HDL 64.20 12/21/2014 1155   CHOLHDL 2 12/21/2014 1155   VLDL 17.8 12/21/2014 1155   LDLCALC 68 12/21/2014 1155    CBC No results found for: WBC, RBC, HGB, HCT, PLT, MCV, MCH, MCHC, RDW, LYMPHSABS, MONOABS, EOSABS, BASOSABS  Hgb A1C No results found for: HGBA1C       Assessment & Plan:   Decreased hearing, 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

## 2015-09-16 NOTE — Patient Instructions (Signed)

## 2015-10-06 ENCOUNTER — Telehealth: Payer: Self-pay | Admitting: Internal Medicine

## 2015-10-06 NOTE — Telephone Encounter (Signed)
Patient returned Melanie's call about a referral.

## 2015-10-07 NOTE — Telephone Encounter (Signed)
8/3-Returned pt call and LVM to call back -arc

## 2015-10-14 ENCOUNTER — Other Ambulatory Visit: Payer: Self-pay | Admitting: Internal Medicine

## 2016-01-01 ENCOUNTER — Other Ambulatory Visit: Payer: Self-pay | Admitting: Internal Medicine

## 2016-01-18 ENCOUNTER — Other Ambulatory Visit: Payer: Self-pay | Admitting: Internal Medicine

## 2016-02-04 ENCOUNTER — Other Ambulatory Visit: Payer: Self-pay

## 2016-02-04 MED ORDER — ROSUVASTATIN CALCIUM 20 MG PO TABS: 20.0000 mg | ORAL_TABLET | Freq: Every day | ORAL | 0 refills | Status: DC

## 2016-02-10 ENCOUNTER — Telehealth: Payer: Self-pay

## 2016-02-10 NOTE — Telephone Encounter (Signed)
Abby with Dr Vaught's office at [facility] left v/m; Dr Pryor Ochoa saw pt earlier today and Dr Pryor Ochoa wants to do a trial med of Dyazide 10 mg once a day to replace her current HCTZ 25 mg. Dr Milas Hock office wants clearance to try this to possibly help Mnire disease if OKed by Avie Echevaria NP. Abby request cb.

## 2016-02-11 NOTE — Telephone Encounter (Signed)
Yes, this is fine. She has a normal creatinine.

## 2016-02-11 NOTE — Telephone Encounter (Signed)
Per Lollie Marrow, they are aware

## 2016-03-10 ENCOUNTER — Telehealth: Payer: Self-pay

## 2016-03-10 DIAGNOSIS — Z5181 Encounter for therapeutic drug level monitoring: Secondary | ICD-10-CM

## 2016-03-10 NOTE — Telephone Encounter (Signed)
You saw  Her 09/2015--please advise--also view 02/10/16 telephone note

## 2016-03-10 NOTE — Telephone Encounter (Signed)
She needs to make an appt for a physical. I have not seen her in over a year

## 2016-03-10 NOTE — Telephone Encounter (Signed)
Pt states she's currently taking triamterene prescribed by Dr.Craighton Vaught (ENT). He asked to do labs to monitor pt while taking this med, she would also like to to have a lipid panel checked.

## 2016-03-11 NOTE — Telephone Encounter (Signed)
She can have BMET for medication monitoring. If she wants lipid, she needs to make an appt for a physical

## 2016-03-13 NOTE — Telephone Encounter (Signed)
I have ordered the lab--what should the Dx be? Please advise

## 2016-03-13 NOTE — Addendum Note (Signed)
Addended by: Lurlean Nanny on: 03/13/2016 04:57 PM   Modules accepted: Orders

## 2016-03-14 NOTE — Addendum Note (Signed)
Addended by: Lurlean Nanny on: 03/14/2016 08:14 AM   Modules accepted: Orders

## 2016-03-31 ENCOUNTER — Encounter: Payer: Self-pay | Admitting: Family Medicine

## 2016-03-31 ENCOUNTER — Ambulatory Visit (INDEPENDENT_AMBULATORY_CARE_PROVIDER_SITE_OTHER): Payer: BC Managed Care – PPO | Admitting: Family Medicine

## 2016-03-31 VITALS — BP 130/72 | HR 90 | Temp 98.4°F | Wt 142.5 lb

## 2016-03-31 DIAGNOSIS — J209 Acute bronchitis, unspecified: Secondary | ICD-10-CM

## 2016-03-31 DIAGNOSIS — J019 Acute sinusitis, unspecified: Secondary | ICD-10-CM

## 2016-03-31 MED ORDER — MOMETASONE FUROATE 50 MCG/ACT NA SUSP: 2.0000 | Freq: Every day | NASAL | 1 refills | Status: DC

## 2016-03-31 MED ORDER — DOXYCYCLINE HYCLATE 100 MG PO TABS: 100.0000 mg | ORAL_TABLET | Freq: Two times a day (BID) | ORAL | 0 refills | Status: DC

## 2016-03-31 MED ORDER — BENZONATATE 100 MG PO CAPS: 100.0000 mg | ORAL_CAPSULE | Freq: Three times a day (TID) | ORAL | 0 refills | Status: DC | PRN

## 2016-03-31 NOTE — Assessment & Plan Note (Signed)
Acute bronchitis /sinusitis with severe cough. Treat with doxy 7d course, nasonex, tessalon perls (pt declined stronger cough syrup), and supportive care with mucinex DM, nyquil, and ibuprofen.  Update if not improving with treatment (would consider prednisone course).

## 2016-03-31 NOTE — Progress Notes (Signed)
BP 130/72   Pulse 90   Temp 98.4 F (36.9 C) (Oral)   Wt 142 lb 8 oz (64.6 kg)   SpO2 97%   BMI 28.78 kg/m    CC: cough Subjective:    Patient ID: Vanessa Walton, female    DOB: 1950/09/08, 67 y.o.   MRN: [id number]  HPI: Vanessa Walton is a 66 y.o. female presenting on 03/31/2016 for Cough (x2 days; recently had tamiflu ppx)   5d h/o malaise, sinus pressure headache, cough started 2 days later. Cough very productive of mucous. PNdrainage. Trouble sleeping from cough. + body aches. Chest > head congestion.  No fevers/chills, ear or tooth pain, ST  Taking mucinex DM, loratadine, nyquil.   Husband recently sick as well.  Non smoker Did receive flu shot this year. No h/o asthma, COPD.  Exposed to flu 03/11/2016. Stayed with father in the hospital earlier this month - father passed away. She did take tamiflu ppx dosing at the beginning of this month.   Meniere's disease - followed by Dr Carmin Richmond ENT.  Relevant past medical, surgical, family and social history reviewed and updated as indicated. Interim medical history since our last visit reviewed. Allergies and medications reviewed and updated. Current Outpatient Prescriptions on File Prior to Visit  Medication Sig  . ezetimibe (ZETIA) 10 MG tablet TAKE 1 TABLET (10 MG TOTAL) BY MOUTH DAILY.  Marland Kitchen Omega-3 Fatty Acids (FISH OIL DOUBLE STRENGTH) 1200 MG CAPS Take by mouth 2 (two) times daily.  . rosuvastatin (CRESTOR) 20 MG tablet Take 1 tablet (20 mg total) by mouth daily. MUST SCHEDULE ANNUAL EXAM FOR FURTHER REFILLS  . diazepam (VALIUM) 2 MG tablet Take 1 tablet (2 mg total) by mouth as needed for anxiety. (Patient not taking: Reported on 03/31/2016)   No current facility-administered medications on file prior to visit.     Review of Systems Per HPI unless specifically indicated in ROS section     Objective:    BP 130/72   Pulse 90   Temp 98.4 F (36.9 C) (Oral)   Wt 142 lb 8 oz (64.6 kg)   SpO2 97%   BMI 28.78 kg/m     Wt Readings from Last 3 Encounters:  03/31/16 142 lb 8 oz (64.6 kg)  09/16/15 137 lb (62.1 kg)  12/21/14 136 lb (61.7 kg)    Physical Exam  Constitutional: She appears well-developed and well-nourished. No distress.  HENT:  Head: Normocephalic and atraumatic.  Right Ear: Hearing, tympanic membrane, external ear and ear canal normal.  Left Ear: Hearing, tympanic membrane, external ear and ear canal normal.  Nose: Mucosal edema (nasal mucosal erythema) present. No rhinorrhea. Right sinus exhibits maxillary sinus tenderness and frontal sinus tenderness. Left sinus exhibits maxillary sinus tenderness and frontal sinus tenderness.  Mouth/Throat: Uvula is midline, oropharynx is clear and moist and mucous membranes are normal. No oropharyngeal exudate, posterior oropharyngeal edema, posterior oropharyngeal erythema or tonsillar abscesses.  Nasal congestion R>L  Eyes: Conjunctivae and EOM are normal. Pupils are equal, round, and reactive to light. No scleral icterus.  Neck: Normal range of motion. Neck supple.  Cardiovascular: Normal rate, regular rhythm, normal heart sounds and intact distal pulses.   No murmur heard. Pulmonary/Chest: Effort normal. No respiratory distress. She has no decreased breath sounds. She has no wheezes. She has rhonchi (faint R>L). She has no rales.  Harsh deep cough present with coughing fits, however lungs overall clear  Lymphadenopathy:    She has no cervical adenopathy.  Skin:  Skin is warm and dry. No rash noted.  Nursing note and vitals reviewed.     Assessment & Plan:   Problem List Items Addressed This Visit    Acute sinusitis - Primary    Acute bronchitis /sinusitis with severe cough. Treat with doxy 7d course, nasonex, tessalon perls (pt declined stronger cough syrup), and supportive care with mucinex DM, nyquil, and ibuprofen.  Update if not improving with treatment (would consider prednisone course).       Relevant Medications   mometasone (NASONEX)  50 MCG/ACT nasal spray   doxycycline (VIBRA-TABS) 100 MG tablet   benzonatate (TESSALON) 100 MG capsule    Other Visit Diagnoses    Acute bronchitis, unspecified organism           Follow up plan: Return if symptoms worsen or fail to improve.  Ria Bush, MD

## 2016-03-31 NOTE — Patient Instructions (Addendum)
You have a sinus infection and bronchitis. Take medicine as prescribed: doxycycline antibiotic for 7 days.  Tessalon perls for cough. Continue nyquil at night.  Push fluids and plenty of rest. Nasal saline irrigation or neti pot to help drain sinuses. May use mucinex DM with plenty of fluid to help mobilize mucous.  Nasonex and or ibuprofen for inflammation in sinuses and bronchioles.  Please let us know if fever >101.5, trouble opening/closing mouth, difficulty swallowing, or worsening instead of improving as expected.

## 2016-03-31 NOTE — Progress Notes (Signed)
Pre visit review using our clinic review tool, if applicable. No additional management support is needed unless otherwise documented below in the visit note. 

## 2016-04-03 ENCOUNTER — Ambulatory Visit: Payer: Self-pay | Admitting: Internal Medicine

## 2016-04-11 ENCOUNTER — Encounter: Payer: Self-pay | Admitting: Internal Medicine

## 2016-04-11 ENCOUNTER — Ambulatory Visit (INDEPENDENT_AMBULATORY_CARE_PROVIDER_SITE_OTHER): Payer: BC Managed Care – PPO | Admitting: Internal Medicine

## 2016-04-11 VITALS — BP 122/74 | HR 77 | Temp 98.3°F | Ht 58.5 in | Wt 142.5 lb

## 2016-04-11 DIAGNOSIS — Z114 Encounter for screening for human immunodeficiency virus [HIV]: Secondary | ICD-10-CM

## 2016-04-11 DIAGNOSIS — Z1159 Encounter for screening for other viral diseases: Secondary | ICD-10-CM | POA: Diagnosis not present

## 2016-04-11 DIAGNOSIS — Z0000 Encounter for general adult medical examination without abnormal findings: Secondary | ICD-10-CM

## 2016-04-11 DIAGNOSIS — K219 Gastro-esophageal reflux disease without esophagitis: Secondary | ICD-10-CM

## 2016-04-11 DIAGNOSIS — H8109 Meniere's disease, unspecified ear: Secondary | ICD-10-CM

## 2016-04-11 DIAGNOSIS — Z23 Encounter for immunization: Secondary | ICD-10-CM

## 2016-04-11 DIAGNOSIS — Z13820 Encounter for screening for osteoporosis: Secondary | ICD-10-CM

## 2016-04-11 DIAGNOSIS — E7800 Pure hypercholesterolemia, unspecified: Secondary | ICD-10-CM

## 2016-04-11 NOTE — Addendum Note (Signed)
Addended by: Lurlean Nanny on: 04/11/2016 04:20 PM   Modules accepted: Orders

## 2016-04-11 NOTE — Assessment & Plan Note (Signed)
Controlled on Zantac Will monitor

## 2016-04-11 NOTE — Assessment & Plan Note (Signed)
CMET and Lipid Profile today Encouraged her to consume a low fat diet Continue Zocor, Zetia and Fish Oil

## 2016-04-11 NOTE — Patient Instructions (Signed)

## 2016-04-11 NOTE — Progress Notes (Signed)
Subjective:    Patient ID: Vanessa Walton, female    DOB: June 20, 1950, 66 y.o.   MRN: [id number]  HPI  Pt presents to the clinic today for her annual exam. She is also due for follow up of chronic conditions.  Meniere's Disease: Chronic. She takes Valium as needed. She reports she takes this once every 6 months. She is following with ENT for this.  HLD: Her last LDL was 68, 12/2014. She is taking Crestor, Fish Oil and Zetia as prescribed. She has been consuming a low fat diet.  GERD: She is not sure what triggers this. She takes Zantac daily. She denies breakthrough symptoms.  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 She takes Nasonex and Singulair with good relief.  Flu: 03/2016 Tetanus: unsure Pneumovax: never Prevnar: never Zostovax: 04/2015 Pap Smear: 11/2014 Mammogram: 12/2014, [facility] Bone Density: 15 years ago Colon Screening: about 10 years ago Vision Screening: annually at [facility] Dentist: biannually  Diet: She does eat meat. She does eat fruits and veggies daily. She tries to avoid fried foods. She drinks mostly water or Dt. Coke. Exercise: She walks a trail 3-4 x week  Review of Systems  Past Medical History:  Diagnosis Date  . GERD (gastroesophageal reflux disease)   . Hyperlipidemia   . Menetrier disease     Current Outpatient Prescriptions  Medication Sig Dispense Refill  . diazepam (VALIUM) 2 MG tablet Take 1 tablet (2 mg total) by mouth as needed for anxiety. 20 tablet 0  . ezetimibe (ZETIA) 10 MG tablet TAKE 1 TABLET (10 MG TOTAL) BY MOUTH DAILY. 90 tablet 1  . mometasone (NASONEX) 50 MCG/ACT nasal spray Place 2 sprays into the nose daily. 17 g 1  . Omega-3 Fatty Acids (FISH OIL DOUBLE STRENGTH) 1200 MG CAPS Take by mouth 2 (two) times daily.    . rosuvastatin (CRESTOR) 20 MG tablet Take 1 tablet (20 mg total) by mouth daily. MUST SCHEDULE ANNUAL EXAM FOR FURTHER REFILLS 30 tablet 0  . benzonatate (TESSALON) 100 MG capsule Take 1 capsule (100 mg  total) by mouth 3 (three) times daily as needed for cough. (Patient not taking: Reported on 04/11/2016) 30 capsule 0  . montelukast (SINGULAIR) 10 MG tablet     . ranitidine (ZANTAC) 150 MG tablet      No current facility-administered medications for this visit.     No Known Allergies  Family History  Problem Relation Age of Onset  . Arthritis Mother   . Hyperlipidemia Mother   . Hyperlipidemia Father     Social History   Social History  . Marital status: Married    Spouse name: N/A  . Number of children: N/A  . Years of education: N/A   Occupational History  . Not on file.   Social History Main Topics  . Smoking status: Never Smoker  . Smokeless tobacco: Never Used  . Alcohol use Yes     Comment: moderate  . Drug use: No  . Sexual activity: Yes    Birth control/ protection: None   Other Topics Concern  . Not on file   Social History Narrative  . No narrative on file     Constitutional: Denies fever, malaise, fatigue, headache or abrupt weight changes.  HEENT: Pt reports hearing loss. Denies eye pain, eye redness, ear pain, ringing in the ears, wax buildup, runny nose, nasal congestion, bloody nose, or sore throat. Respiratory: Denies difficulty breathing, shortness of breath, cough or sputum production.   Cardiovascular: Denies chest  pain, chest tightness, palpitations or swelling in the hands or feet.  Gastrointestinal: Denies abdominal pain, bloating, constipation, diarrhea or blood in the stool.  GU: Denies urgency, frequency, pain with urination, burning sensation, blood in urine, odor or discharge. Musculoskeletal: Denies decrease in range of motion, difficulty with gait, muscle pain or joint pain and swelling.  Skin: Denies redness, rashes, lesions or ulcercations.  Neurological: Denies dizziness, difficulty with memory, difficulty with speech or problems with balance and coordination.  Psych: Denies anxiety, depression, SI/HI.  No other specific complaints  in a complete review of systems (except as listed in HPI above).     Objective:   Physical Exam   BP 122/74   Pulse 77   Temp 98.3 F (36.8 C) (Oral)   Ht 4' 10.5" (1.486 m)   Wt 142 lb 8 oz (64.6 kg)   SpO2 98%   BMI 29.28 kg/m  Wt Readings from Last 3 Encounters:  04/11/16 142 lb 8 oz (64.6 kg)  03/31/16 142 lb 8 oz (64.6 kg)  09/16/15 137 lb (62.1 kg)    General: Appears her stated age, well developed, well nourished in NAD. Skin: Warm, dry and intact.  HEENT: Head: normal shape and size; Eyes: sclera white, no icterus, conjunctiva pink, PERRLA and EOMs intact; Ears: Tm's gray and intact, normal light reflex; Throat/Mouth: Teeth present, mucosa pink and moist, no exudate, lesions or ulcerations noted.  Neck:  Neck supple, trachea midline. No masses, lumps or thyromegaly present.  Cardiovascular: Normal rate and rhythm. S1,S2 noted.  No murmur, rubs or gallops noted. No JVD or BLE edema. No carotid bruits noted. Pulmonary/Chest: Normal effort and positive vesicular breath sounds. No respiratory distress. No wheezes, rales or ronchi noted.  Abdomen: Soft and nontender. Normal bowel sounds. No distention or masses noted. Liver, spleen and kidneys non palpable. Musculoskeletal: Strength 5/5 BUE/BLE. No difficulty with gait.  Neurological: Alert and oriented. Cranial nerves II-XII grossly intact. Coordination normal.  Psychiatric: Mood and affect normal. Behavior is normal. Judgment and thought content normal.     BMET    Component Value Date/Time   NA 136 12/21/2014 1155   K 3.6 12/21/2014 1155   CL 97 12/21/2014 1155   CO2 30 12/21/2014 1155   GLUCOSE 102 (H) 12/21/2014 1155   BUN 12 12/21/2014 1155   CREATININE 0.67 12/21/2014 1155   CALCIUM 9.7 12/21/2014 1155    Lipid Panel     Component Value Date/Time   CHOL 150 12/21/2014 1155   TRIG 89.0 12/21/2014 1155   HDL 64.20 12/21/2014 1155   CHOLHDL 2 12/21/2014 1155   VLDL 17.8 12/21/2014 1155   LDLCALC 68  12/21/2014 1155    CBC No results found for: WBC, RBC, HGB, HCT, PLT, MCV, MCH, MCHC, RDW, LYMPHSABS, MONOABS, EOSABS, BASOSABS  Hgb A1C No results found for: HGBA1C     Assessment & Plan:   Preventative Health Maintenance:  Flu shot and zostavax UTD She declines tetanus booster today Will give Prevnar today, Pneumovax in 1 year Pap smear due 2021 Mammogram overdue, she will call [facility] to schedule- number provided Bone density ordered, she will call [facility] to schedule She declines colonoscopy but is agreeable to Cologuard- ordered Encouraged her to consume a balance diet and exercise regimen Advised her to see an eye doctor and dentist annually Will check CBC, CMET, Lipid, Vit D, HIV and Hep C today  RTC in 1 year, sooner if needed

## 2016-04-11 NOTE — Assessment & Plan Note (Signed)
She is following with ENT Continue Valium prn

## 2016-04-12 LAB — COMPREHENSIVE METABOLIC PANEL
ALBUMIN: 4.8 g/dL (ref 3.5–5.2)
ALT: 48 U/L — ABNORMAL HIGH (ref 0–35)
AST: 41 U/L — AB (ref 0–37)
Alkaline Phosphatase: 36 U/L — ABNORMAL LOW (ref 39–117)
BUN: 21 mg/dL (ref 6–23)
CHLORIDE: 101 meq/L (ref 96–112)
CO2: 27 meq/L (ref 19–32)
CREATININE: 1.02 mg/dL (ref 0.40–1.20)
Calcium: 9.7 mg/dL (ref 8.4–10.5)
GFR: 57.75 mL/min — ABNORMAL LOW (ref >60.00)
GLUCOSE: 98 mg/dL (ref 70–99)
Potassium: 3.8 mEq/L (ref 3.5–5.1)
SODIUM: 137 meq/L (ref 135–145)
Total Bilirubin: 0.4 mg/dL (ref 0.2–1.2)
Total Protein: 7.4 g/dL (ref 6.0–8.3)

## 2016-04-12 LAB — CBC
HCT: 38.8 % (ref 36.0–46.0)
Hemoglobin: 13.3 g/dL (ref 12.0–15.0)
MCHC: 34.2 g/dL (ref 30.0–36.0)
MCV: 94.6 fl (ref 78.0–100.0)
Platelets: 199 10*3/uL (ref 150.0–400.0)
RBC: 4.1 Mil/uL (ref 3.87–5.11)
RDW: 12.8 % (ref 11.5–15.5)
WBC: 8.6 10*3/uL (ref 4.0–10.5)

## 2016-04-12 LAB — LIPID PANEL
CHOL/HDL RATIO: 2
CHOLESTEROL: 187 mg/dL (ref 0–200)
HDL: 83 mg/dL (ref >39.00)
NONHDL: 104.35
Triglycerides: 208 mg/dL — ABNORMAL HIGH (ref 0.0–149.0)
VLDL: 41.6 mg/dL — ABNORMAL HIGH (ref 0.0–40.0)

## 2016-04-12 LAB — HIV ANTIBODY (ROUTINE TESTING W REFLEX): HIV: NONREACTIVE

## 2016-04-12 LAB — LDL CHOLESTEROL, DIRECT: Direct LDL: 77 mg/dL

## 2016-04-12 LAB — HEPATITIS C ANTIBODY: HCV AB: REACTIVE — AB

## 2016-04-12 LAB — VITAMIN D 25 HYDROXY (VIT D DEFICIENCY, FRACTURES): VITD: 43.23 ng/mL (ref 30.00–100.00)

## 2016-04-13 ENCOUNTER — Telehealth: Payer: Self-pay

## 2016-04-13 NOTE — Telephone Encounter (Signed)
Cologuard order has been faxed. 

## 2016-04-14 LAB — HEPATITIS C RNA QUANTITATIVE
HCV Quantitative Log: <1.18 Log IU/mL
HCV Quantitative: <15 IU/mL

## 2016-04-18 ENCOUNTER — Other Ambulatory Visit: Payer: Self-pay | Admitting: Internal Medicine

## 2016-04-18 DIAGNOSIS — Z1231 Encounter for screening mammogram for malignant neoplasm of breast: Secondary | ICD-10-CM

## 2016-05-09 ENCOUNTER — Encounter: Payer: Self-pay | Admitting: Internal Medicine

## 2016-05-30 ENCOUNTER — Ambulatory Visit
Admission: RE | Admit: 2016-05-30 | Discharge: 2016-05-30 | Disposition: A | Discharge status: Home / Self Care | Payer: BC Managed Care – PPO | Source: Ambulatory Visit | Attending: Internal Medicine | Admitting: Internal Medicine

## 2016-05-30 DIAGNOSIS — Z13820 Encounter for screening for osteoporosis: Secondary | ICD-10-CM | POA: Diagnosis present

## 2016-05-30 DIAGNOSIS — Z780 Asymptomatic menopausal state: Secondary | ICD-10-CM | POA: Insufficient documentation

## 2016-05-30 DIAGNOSIS — Z1231 Encounter for screening mammogram for malignant neoplasm of breast: Secondary | ICD-10-CM

## 2016-05-30 DIAGNOSIS — N6489 Other specified disorders of breast: Secondary | ICD-10-CM | POA: Insufficient documentation

## 2016-06-19 ENCOUNTER — Other Ambulatory Visit: Payer: Self-pay | Admitting: Family Medicine

## 2016-08-26 ENCOUNTER — Other Ambulatory Visit: Payer: Self-pay | Admitting: Internal Medicine

## 2016-10-16 ENCOUNTER — Other Ambulatory Visit: Payer: Self-pay | Admitting: Internal Medicine

## 2016-10-17 NOTE — Telephone Encounter (Signed)
Last filled 07/2014... Last CPE 04/2016... Please advise

## 2016-10-17 NOTE — Telephone Encounter (Signed)
Can you call and verify, is she requesting this for her Meniere's. Is it flaring up?

## 2016-10-19 NOTE — Telephone Encounter (Signed)
Left detailed msg on VM per HIPAA  

## 2016-11-13 DIAGNOSIS — H6901 Patulous Eustachian tube, right ear: Secondary | ICD-10-CM | POA: Insufficient documentation

## 2016-11-13 DIAGNOSIS — H903 Sensorineural hearing loss, bilateral: Secondary | ICD-10-CM | POA: Insufficient documentation

## 2016-11-17 ENCOUNTER — Other Ambulatory Visit: Payer: Self-pay

## 2016-11-17 MED ORDER — EZETIMIBE 10 MG PO TABS: ORAL_TABLET | ORAL | 1 refills | Status: DC

## 2016-12-07 ENCOUNTER — Ambulatory Visit (INDEPENDENT_AMBULATORY_CARE_PROVIDER_SITE_OTHER): Payer: BC Managed Care – PPO | Admitting: Internal Medicine

## 2016-12-07 ENCOUNTER — Encounter: Payer: Self-pay | Admitting: Internal Medicine

## 2016-12-07 VITALS — BP 122/70 | HR 91 | Temp 98.4°F | Wt 143.0 lb

## 2016-12-07 DIAGNOSIS — J329 Chronic sinusitis, unspecified: Secondary | ICD-10-CM

## 2016-12-07 DIAGNOSIS — B9789 Other viral agents as the cause of diseases classified elsewhere: Secondary | ICD-10-CM

## 2016-12-07 DIAGNOSIS — Z23 Encounter for immunization: Secondary | ICD-10-CM | POA: Diagnosis not present

## 2016-12-07 MED ORDER — PREDNISONE 10 MG PO TABS: ORAL_TABLET | ORAL | 0 refills | Status: DC

## 2016-12-07 NOTE — Progress Notes (Signed)
HPI  Pt presents to the clinic today with c/o facial pain and pressure, nasal congestion, ear fullness and cough. This started 1 week ago. She is blowing clear mucous out of her nose. She denies ear pain. The cough is productive of clear mucous. She denies fever, chills or body aches. She has been taking Singulair as prescribed. She stopped Nasonex according to ENT, she is seeing them for ETD. She was started on Chlorthalidone.  Review of Systems     Past Medical History:  Diagnosis Date  . GERD (gastroesophageal reflux disease)   . Hyperlipidemia   . Menetrier disease     Family History  Problem Relation Age of Onset  . Arthritis Mother   . Hyperlipidemia Mother   . Hyperlipidemia Father     Social History   Social History  . Marital status: Married    Spouse name: N/A  . Number of children: N/A  . Years of education: N/A   Occupational History  . Not on file.   Social History Main Topics  . Smoking status: Never Smoker  . Smokeless tobacco: Never Used  . Alcohol use Yes     Comment: moderate  . Drug use: No  . Sexual activity: Yes    Birth control/ protection: None   Other Topics Concern  . Not on file   Social History Narrative  . No narrative on file    No Known Allergies   Constitutional: Positive headache. Denies fatigue, fever or abrupt weight changes.  HEENT:  Positive nasal congestion. Denies eye redness, ear pain, ringing in the ears, wax buildup, runny nose or sore throat. Respiratory: Positive cough. Denies difficulty breathing or shortness of breath.  Cardiovascular: Denies chest pain, chest tightness, palpitations or swelling in the hands or feet.   No other specific complaints in a complete review of systems (except as listed in HPI above).  Objective:   BP 122/70   Pulse 91   Temp 98.4 F (36.9 C) (Oral)   Wt 143 lb (64.9 kg)   SpO2 97%   BMI 29.38 kg/m   General: Appears her stated age,  in NAD. HEENT: Head: normal shape and size,  no sinus tenderness noted; Nose: mucosa pink and moist, septum midline; Throat/Mouth: + PND. Teeth present, mucosa pink and moist, no exudate noted, no lesions or ulcerations noted.  Neck:  No adenopathy noted.  Pulmonary/Chest: Normal effort and positive vesicular breath sounds. No respiratory distress. No wheezes, rales or ronchi noted.       Assessment & Plan:   Viral Sinusitis  Can use a Neti Pot which can be purchased from your local drug store. Continue Singulair eRx for Pred Taper x 6 days  RTC as needed or if symptoms persist. Webb Silversmith, NP

## 2016-12-07 NOTE — Addendum Note (Signed)
Addended by: Lurlean Nanny on: 12/07/2016 12:48 PM   Modules accepted: Orders

## 2016-12-07 NOTE — Patient Instructions (Signed)

## 2017-02-13 ENCOUNTER — Other Ambulatory Visit: Payer: Self-pay | Admitting: Internal Medicine

## 2017-03-14 ENCOUNTER — Other Ambulatory Visit: Payer: Self-pay

## 2017-03-14 MED ORDER — ROSUVASTATIN CALCIUM 20 MG PO TABS: 20.0000 mg | ORAL_TABLET | Freq: Every day | ORAL | 0 refills | Status: DC

## 2017-03-14 MED ORDER — EZETIMIBE 10 MG PO TABS: ORAL_TABLET | ORAL | 0 refills | Status: DC

## 2017-03-14 NOTE — Telephone Encounter (Signed)
CPE reminder letter mailed 

## 2017-03-14 NOTE — Addendum Note (Signed)
Addended by: Lurlean Nanny on: 03/14/2017 12:55 PM   Modules accepted: Orders

## 2017-05-21 ENCOUNTER — Encounter: Payer: BC Managed Care – PPO | Admitting: Internal Medicine

## 2017-05-24 ENCOUNTER — Encounter: Payer: Self-pay | Admitting: Internal Medicine

## 2017-05-24 ENCOUNTER — Ambulatory Visit (INDEPENDENT_AMBULATORY_CARE_PROVIDER_SITE_OTHER): Payer: BC Managed Care – PPO | Admitting: Internal Medicine

## 2017-05-24 VITALS — BP 134/82 | HR 73 | Temp 98.2°F | Ht 59.0 in | Wt 143.0 lb

## 2017-05-24 DIAGNOSIS — Z0000 Encounter for general adult medical examination without abnormal findings: Secondary | ICD-10-CM | POA: Diagnosis not present

## 2017-05-24 DIAGNOSIS — E7800 Pure hypercholesterolemia, unspecified: Secondary | ICD-10-CM

## 2017-05-24 DIAGNOSIS — H6901 Patulous Eustachian tube, right ear: Secondary | ICD-10-CM

## 2017-05-24 DIAGNOSIS — I10 Essential (primary) hypertension: Secondary | ICD-10-CM

## 2017-05-24 DIAGNOSIS — K219 Gastro-esophageal reflux disease without esophagitis: Secondary | ICD-10-CM | POA: Diagnosis not present

## 2017-05-24 LAB — CBC
HCT: 37.5 % (ref 36.0–46.0)
Hemoglobin: 12.8 g/dL (ref 12.0–15.0)
MCHC: 34.2 g/dL (ref 30.0–36.0)
MCV: 95 fl (ref 78.0–100.0)
PLATELETS: 209 10*3/uL (ref 150.0–400.0)
RBC: 3.95 Mil/uL (ref 3.87–5.11)
RDW: 13.2 % (ref 11.5–15.5)
WBC: 5.4 10*3/uL (ref 4.0–10.5)

## 2017-05-24 LAB — VITAMIN D 25 HYDROXY (VIT D DEFICIENCY, FRACTURES): VITD: 38.88 ng/mL (ref 30.00–100.00)

## 2017-05-24 MED ORDER — DIAZEPAM 2 MG PO TABS: 2.0000 mg | ORAL_TABLET | ORAL | 0 refills | Status: DC | PRN

## 2017-05-24 NOTE — Progress Notes (Signed)
Subjective:    Patient ID: Vanessa Walton, female    DOB: 1950-03-10, 67 y.o.   MRN: [id number]  HPI  Pt presents to the clinic today for her annual exam. She is also due to follow up chronic conditions.  HTN: Her BP today is 134/82. She is no longer taking her Triamterene HCT. She would like to stay off medication if at all possible.   GERD: Controlled on Ranitidine. She denies breakthrough symptoms.   HLD: Her last LDL was 77, 04/2016. She denies myalgias on Rosuvastatin. 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 They stopped her HCTZ as they felt like that was exacerbating her symptoms. She has had significant improvement since that time. She does take Valium as needed for travel to prevent anxiety due to dizziness. She would like a refill of this today.  Flu: 12/2016 Tetanus: unsure Pneumovax: never Prevnar: 04/2016 Zostovax: 2017 Mammogram: 05/2016 Pap Smear: 11/2014 Bone Density: 05/2016 Colon Screening: never Vision Screening: annually Dentist: biannually  Diet: She does eat meat. She consumes fruits and veggies daily. She tries to avoid fried foods. She drinks mostly water. Exercise: Walking  Review of Systems      Past Medical History:  Diagnosis Date  . GERD (gastroesophageal reflux disease)   . Hyperlipidemia   . Menetrier disease     Current Outpatient Medications  Medication Sig Dispense Refill  . ezetimibe (ZETIA) 10 MG tablet TAKE 1 TABLET (10 MG TOTAL) BY MOUTH DAILY. MUST SCHEDULE ANNUAL PHYSICAL 90 tablet 0  . Omega-3 Fatty Acids (FISH OIL DOUBLE STRENGTH) 1200 MG CAPS Take by mouth 2 (two) times daily.    . ranitidine (ZANTAC) 150 MG tablet     . rosuvastatin (CRESTOR) 20 MG tablet Take 1 tablet (20 mg total) by mouth daily. MUST SCHEDULE ANNUAL PHYSICAL 90 tablet 0  . diazepam (VALIUM) 2 MG tablet Take 1 tablet (2 mg total) by mouth as needed for anxiety. (Patient not taking: Reported on 05/24/2017) 20 tablet 0  .  triamterene-hydrochlorothiazide (DYAZIDE) 37.5-25 MG capsule Take 1 capsule by mouth daily. Prescribed by ENT  6   No current facility-administered medications for this visit.     No Known Allergies  Family History  Problem Relation Age of Onset  . Arthritis Mother   . Hyperlipidemia Mother   . Hyperlipidemia Father     Social History   Socioeconomic History  . Marital status: Married    Spouse name: Not on file  . Number of children: Not on file  . Years of education: Not on file  . Highest education level: Not on file  Occupational History  . Not on file  Social Needs  . Financial resource strain: Not on file  . Food insecurity:    Worry: Not on file    Inability: Not on file  . Transportation needs:    Medical: Not on file    Non-medical: Not on file  Tobacco Use  . Smoking status: Never Smoker  . Smokeless tobacco: Never Used  Substance and Sexual Activity  . Alcohol use: Yes    Comment: moderate  . Drug use: No  . Sexual activity: Yes    Birth control/protection: None  Lifestyle  . Physical activity:    Days per week: Not on file    Minutes per session: Not on file  . Stress: Not on file  Relationships  . Social connections:    Talks on phone: Not on  file    Gets together: Not on file    Attends religious service: Not on file    Active member of club or organization: Not on file    Attends meetings of clubs or organizations: Not on file    Relationship status: Not on file  . Intimate partner violence:    Fear of current or ex partner: Not on file    Emotionally abused: Not on file    Physically abused: Not on file    Forced sexual activity: Not on file  Other Topics Concern  . Not on file  Social History Narrative  . Not on file     Constitutional: Denies fever, malaise, fatigue, headache or abrupt weight changes.  HEENT: Denies eye pain, eye redness, ear pain, ringing in the ears, wax buildup, runny nose, nasal congestion, bloody nose, or sore  throat. Respiratory: Denies difficulty breathing, shortness of breath, cough or sputum production.   Cardiovascular: Denies chest pain, chest tightness, palpitations or swelling in the hands or feet.  Gastrointestinal: Pt reports intermittent reflux. Denies abdominal pain, bloating, constipation, diarrhea or blood in the stool.  GU: Denies urgency, frequency, pain with urination, burning sensation, blood in urine, odor or discharge. Musculoskeletal: Denies decrease in range of motion, difficulty with gait, muscle pain or joint pain and swelling.  Skin: Denies redness, rashes, lesions or ulcercations.  Neurological: Pt reports intermittent dizziness. Denies difficulty with memory, difficulty with speech or problems with balance and coordination.  Psych: Denies anxiety, depression, SI/HI.  No other specific complaints in a complete review of systems (except as listed in HPI above).  Objective:   Physical Exam  BP 134/82   Pulse 73   Temp 98.2 F (36.8 C) (Oral)   Ht 4\' 11"  (1.499 m)   Wt 143 lb (64.9 kg)   SpO2 97%   BMI 28.88 kg/m   Wt Readings from Last 3 Encounters:  05/24/17 143 lb (64.9 kg)  12/07/16 143 lb (64.9 kg)  04/11/16 142 lb 8 oz (64.6 kg)    General: Appears her stated age, well developed, well nourished in NAD. Skin: Warm, dry and intact.  HEENT: Head: normal shape and size; Eyes: sclera white, no icterus, conjunctiva pink, PERRLA and EOMs intact; Throat/Mouth: Teeth present, mucosa pink and moist, no exudate, lesions or ulcerations noted.  Neck:  Neck supple, trachea midline. No masses, lumps or thyromegaly present.  Cardiovascular: Normal rate and rhythm. S1,S2 noted.  No murmur, rubs or gallops noted. No JVD or BLE edema. No carotid bruits noted. Pulmonary/Chest: Normal effort and positive vesicular breath sounds. No respiratory distress. No wheezes, rales or ronchi noted.  Abdomen: Soft and nontender. Normal bowel sounds. No distention or masses noted. Liver,  spleen and kidneys non palpable. Musculoskeletal: Strength 5/5 BUE/BLE. No difficulty with gait.  Neurological: Alert and oriented. Cranial nerves II-XII grossly intact. Coordination normal.  Psychiatric: Mood and affect normal. Behavior is normal. Judgment and thought content normal.    BMET    Component Value Date/Time   NA 137 04/11/2016 1608   K 3.8 04/11/2016 1608   CL 101 04/11/2016 1608   CO2 27 04/11/2016 1608   GLUCOSE 98 04/11/2016 1608   BUN 21 04/11/2016 1608   CREATININE 1.02 04/11/2016 1608   CALCIUM 9.7 04/11/2016 1608    Lipid Panel     Component Value Date/Time   CHOL 187 04/11/2016 1608   TRIG 208.0 (H) 04/11/2016 1608   HDL 83.00 04/11/2016 1608   CHOLHDL 2 04/11/2016  1608   VLDL 41.6 (H) 04/11/2016 1608   LDLCALC 68 12/21/2014 1155    CBC    Component Value Date/Time   WBC 8.6 04/11/2016 1608   RBC 4.10 04/11/2016 1608   HGB 13.3 04/11/2016 1608   HCT 38.8 04/11/2016 1608   PLT 199.0 04/11/2016 1608   MCV 94.6 04/11/2016 1608   MCHC 34.2 04/11/2016 1608   RDW 12.8 04/11/2016 1608    Hgb A1C No results found for: HGBA1C          Assessment & Plan:   Preventative Health Maintenance:  Flu, prevnar, zostovax UTD Tdap and pneumovax given today Mammogram due, she will call to schedule Pap smear, bone density UTD She will call her insurance to see if Cologuard is covered now but declines colonoscopy Encouraged her to consume a balanced diet and exercise regimen Advised her to see an eye doctor and dentist annually Will check CBC, CMET, Lipid and Vit D today  RTC in 1 year, sooner if needed Webb Silversmith, NP

## 2017-05-24 NOTE — Patient Instructions (Signed)
Health Maintenance for Postmenopausal Women Menopause is a normal process in which your reproductive ability comes to an end. This process happens gradually over a span of months to years, usually between the ages of 22 and 9. Menopause is complete when you have missed 12 consecutive menstrual periods. It is important to talk with your health care provider about some of the most common conditions that affect postmenopausal women, such as heart disease, cancer, and bone loss (osteoporosis). Adopting a healthy lifestyle and getting preventive care can help to promote your health and wellness. Those actions can also lower your chances of developing some of these common conditions. What should I know about menopause? During menopause, you may experience a number of symptoms, such as:  Moderate-to-severe hot flashes.  Night sweats.  Decrease in sex drive.  Mood swings.  Headaches.  Tiredness.  Irritability.  Memory problems.  Insomnia.  Choosing to treat or not to treat menopausal changes is an individual decision that you make with your health care provider. What should I know about hormone replacement therapy and supplements? Hormone therapy products are effective for treating symptoms that are associated with menopause, such as hot flashes and night sweats. Hormone replacement carries certain risks, especially as you become older. If you are thinking about using estrogen or estrogen with progestin treatments, discuss the benefits and risks with your health care provider. What should I know about heart disease and stroke? Heart disease, heart attack, and stroke become more likely as you age. This may be due, in part, to the hormonal changes that your body experiences during menopause. These can affect how your body processes dietary fats, triglycerides, and cholesterol. Heart attack and stroke are both medical emergencies. There are many things that you can do to help prevent heart disease  and stroke:  Have your blood pressure checked at least every 1-2 years. High blood pressure causes heart disease and increases the risk of stroke.  If you are 53-22 years old, ask your health care provider if you should take aspirin to prevent a heart attack or a stroke.  Do not use any tobacco products, including cigarettes, chewing tobacco, or electronic cigarettes. If you need help quitting, ask your health care provider.  It is important to eat a healthy diet and maintain a healthy weight. ? Be sure to include plenty of vegetables, fruits, low-fat dairy products, and lean protein. ? Avoid eating foods that are high in solid fats, added sugars, or salt (sodium).  Get regular exercise. This is one of the most important things that you can do for your health. ? Try to exercise for at least 150 minutes each week. The type of exercise that you do should increase your heart rate and make you sweat. This is known as moderate-intensity exercise. ? Try to do strengthening exercises at least twice each week. Do these in addition to the moderate-intensity exercise.  Know your numbers.Ask your health care provider to check your cholesterol and your blood glucose. Continue to have your blood tested as directed by your health care provider.  What should I know about cancer screening? There are several types of cancer. Take the following steps to reduce your risk and to catch any cancer development as early as possible. Breast Cancer  Practice breast self-awareness. ? This means understanding how your breasts normally appear and feel. ? It also means doing regular breast self-exams. Let your health care provider know about any changes, no matter how small.  If you are 40  or older, have a clinician do a breast exam (clinical breast exam or CBE) every year. Depending on your age, family history, and medical history, it may be recommended that you also have a yearly breast X-ray (mammogram).  If you  have a family history of breast cancer, talk with your health care provider about genetic screening.  If you are at high risk for breast cancer, talk with your health care provider about having an MRI and a mammogram every year.  Breast cancer (BRCA) gene test is recommended for women who have family members with BRCA-related cancers. Results of the assessment will determine the need for genetic counseling and BRCA1 and for BRCA2 testing. BRCA-related cancers include these types: ? Breast. This occurs in males or females. ? Ovarian. ? Tubal. This may also be called fallopian tube cancer. ? Cancer of the abdominal or pelvic lining (peritoneal cancer). ? Prostate. ? Pancreatic.  Cervical, Uterine, and Ovarian Cancer Your health care provider may recommend that you be screened regularly for cancer of the pelvic organs. These include your ovaries, uterus, and vagina. This screening involves a pelvic exam, which includes checking for microscopic changes to the surface of your cervix (Pap test).  For women ages 21-65, health care providers may recommend a pelvic exam and a Pap test every three years. For women ages 79-65, they may recommend the Pap test and pelvic exam, combined with testing for human papilloma virus (HPV), every five years. Some types of HPV increase your risk of cervical cancer. Testing for HPV may also be done on women of any age who have unclear Pap test results.  Other health care providers may not recommend any screening for nonpregnant women who are considered low risk for pelvic cancer and have no symptoms. Ask your health care provider if a screening pelvic exam is right for you.  If you have had past treatment for cervical cancer or a condition that could lead to cancer, you need Pap tests and screening for cancer for at least 20 years after your treatment. If Pap tests have been discontinued for you, your risk factors (such as having a new sexual partner) need to be  reassessed to determine if you should start having screenings again. Some women have medical problems that increase the chance of getting cervical cancer. In these cases, your health care provider may recommend that you have screening and Pap tests more often.  If you have a family history of uterine cancer or ovarian cancer, talk with your health care provider about genetic screening.  If you have vaginal bleeding after reaching menopause, tell your health care provider.  There are currently no reliable tests available to screen for ovarian cancer.  Lung Cancer Lung cancer screening is recommended for adults 69-62 years old who are at high risk for lung cancer because of a history of smoking. A yearly low-dose CT scan of the lungs is recommended if you:  Currently smoke.  Have a history of at least 30 pack-years of smoking and you currently smoke or have quit within the past 15 years. A pack-year is smoking an average of one pack of cigarettes per day for one year.  Yearly screening should:  Continue until it has been 15 years since you quit.  Stop if you develop a health problem that would prevent you from having lung cancer treatment.  Colorectal Cancer  This type of cancer can be detected and can often be prevented.  Routine colorectal cancer screening usually begins at  age 42 and continues through age 45.  If you have risk factors for colon cancer, your health care provider may recommend that you be screened at an earlier age.  If you have a family history of colorectal cancer, talk with your health care provider about genetic screening.  Your health care provider may also recommend using home test kits to check for hidden blood in your stool.  A small camera at the end of a tube can be used to examine your colon directly (sigmoidoscopy or colonoscopy). This is done to check for the earliest forms of colorectal cancer.  Direct examination of the colon should be repeated every  5-10 years until age 71. However, if early forms of precancerous polyps or small growths are found or if you have a family history or genetic risk for colorectal cancer, you may need to be screened more often.  Skin Cancer  Check your skin from head to toe regularly.  Monitor any moles. Be sure to tell your health care provider: ? About any new moles or changes in moles, especially if there is a change in a mole's shape or color. ? If you have a mole that is larger than the size of a pencil eraser.  If any of your family members has a history of skin cancer, especially at a young age, talk with your health care provider about genetic screening.  Always use sunscreen. Apply sunscreen liberally and repeatedly throughout the day.  Whenever you are outside, protect yourself by wearing long sleeves, pants, a wide-brimmed hat, and sunglasses.  What should I know about osteoporosis? Osteoporosis is a condition in which bone destruction happens more quickly than new bone creation. After menopause, you may be at an increased risk for osteoporosis. To help prevent osteoporosis or the bone fractures that can happen because of osteoporosis, the following is recommended:  If you are 46-71 years old, get at least 1,000 mg of calcium and at least 600 mg of vitamin D per day.  If you are older than age 55 but younger than age 65, get at least 1,200 mg of calcium and at least 600 mg of vitamin D per day.  If you are older than age 54, get at least 1,200 mg of calcium and at least 800 mg of vitamin D per day.  Smoking and excessive alcohol intake increase the risk of osteoporosis. Eat foods that are rich in calcium and vitamin D, and do weight-bearing exercises several times each week as directed by your health care provider. What should I know about how menopause affects my mental health? Depression may occur at any age, but it is more common as you become older. Common symptoms of depression  include:  Low or sad mood.  Changes in sleep patterns.  Changes in appetite or eating patterns.  Feeling an overall lack of motivation or enjoyment of activities that you previously enjoyed.  Frequent crying spells.  Talk with your health care provider if you think that you are experiencing depression. What should I know about immunizations? It is important that you get and maintain your immunizations. These include:  Tetanus, diphtheria, and pertussis (Tdap) booster vaccine.  Influenza every year before the flu season begins.  Pneumonia vaccine.  Shingles vaccine.  Your health care provider may also recommend other immunizations. This information is not intended to replace advice given to you by your health care provider. Make sure you discuss any questions you have with your health care provider. Document Released: 04/14/2005  Document Revised: 09/10/2015 Document Reviewed: 11/24/2014 Elsevier Interactive Patient Education  2018 [employer]  

## 2017-05-25 LAB — COMPREHENSIVE METABOLIC PANEL
ALBUMIN: 4.8 g/dL (ref 3.5–5.2)
ALT: 39 U/L — AB (ref 0–35)
AST: 34 U/L (ref 0–37)
Alkaline Phosphatase: 34 U/L — ABNORMAL LOW (ref 39–117)
BILIRUBIN TOTAL: 0.5 mg/dL (ref 0.2–1.2)
BUN: 19 mg/dL (ref 6–23)
CALCIUM: 9.8 mg/dL (ref 8.4–10.5)
CO2: 22 mEq/L (ref 19–32)
CREATININE: 1.03 mg/dL (ref 0.40–1.20)
Chloride: 104 mEq/L (ref 96–112)
GFR: 56.9 mL/min — ABNORMAL LOW (ref >60.00)
Glucose, Bld: 93 mg/dL (ref 70–99)
Potassium: 4.3 mEq/L (ref 3.5–5.1)
Sodium: 138 mEq/L (ref 135–145)
TOTAL PROTEIN: 7.5 g/dL (ref 6.0–8.3)

## 2017-05-25 LAB — LIPID PANEL
CHOLESTEROL: 156 mg/dL (ref 0–200)
HDL: 74.9 mg/dL (ref >39.00)
LDL Cholesterol: 56 mg/dL (ref 0–99)
NonHDL: 81.53
TRIGLYCERIDES: 129 mg/dL (ref 0.0–149.0)
Total CHOL/HDL Ratio: 2
VLDL: 25.8 mg/dL (ref 0.0–40.0)

## 2017-05-26 ENCOUNTER — Encounter: Payer: Self-pay | Admitting: Internal Medicine

## 2017-05-26 DIAGNOSIS — I10 Essential (primary) hypertension: Secondary | ICD-10-CM | POA: Insufficient documentation

## 2017-05-26 NOTE — Assessment & Plan Note (Signed)
Continue to follow with ENT Valium refilled today

## 2017-05-26 NOTE — Assessment & Plan Note (Signed)
CMET and Lipid profile today Encouraged her to consume a low fat diet Continue Rosuvastatin for now, will adjust if needed based on labs 

## 2017-05-26 NOTE — Assessment & Plan Note (Signed)
CBC and CMET today Discussed avoiding foods that trigger reflux Continue Rantidine

## 2017-05-26 NOTE — Assessment & Plan Note (Signed)
Borderline off meds Discussed DASH diet and exercise for weight loss Will monitor, if > 140/90 will treat, not with diuretics

## 2017-06-07 ENCOUNTER — Other Ambulatory Visit: Payer: Self-pay | Admitting: Internal Medicine

## 2017-07-27 ENCOUNTER — Encounter: Payer: Self-pay | Admitting: Internal Medicine

## 2017-07-27 DIAGNOSIS — Z1211 Encounter for screening for malignant neoplasm of colon: Secondary | ICD-10-CM

## 2017-07-31 ENCOUNTER — Other Ambulatory Visit: Payer: Self-pay | Admitting: Internal Medicine

## 2017-07-31 DIAGNOSIS — Z1231 Encounter for screening mammogram for malignant neoplasm of breast: Secondary | ICD-10-CM

## 2017-08-03 ENCOUNTER — Other Ambulatory Visit: Payer: Self-pay

## 2017-08-03 DIAGNOSIS — Z1211 Encounter for screening for malignant neoplasm of colon: Secondary | ICD-10-CM

## 2017-08-17 ENCOUNTER — Ambulatory Visit
Admission: RE | Admit: 2017-08-17 | Discharge: 2017-08-17 | Disposition: A | Discharge status: Home / Self Care | Payer: BC Managed Care – PPO | Source: Ambulatory Visit | Attending: Internal Medicine | Admitting: Internal Medicine

## 2017-08-17 DIAGNOSIS — Z1231 Encounter for screening mammogram for malignant neoplasm of breast: Secondary | ICD-10-CM | POA: Diagnosis present

## 2017-08-20 ENCOUNTER — Encounter: Payer: Self-pay | Admitting: *Deleted

## 2017-08-21 NOTE — Discharge Instructions (Signed)
General Anesthesia, Adult, Care After °These instructions provide you with information about caring for yourself after your procedure. Your health care provider may also give you more specific instructions. Your treatment has been planned according to current medical practices, but problems sometimes occur. Call your health care provider if you have any problems or questions after your procedure. °What can I expect after the procedure? °After the procedure, it is common to have: °· Vomiting. °· A sore throat. °· Mental slowness. ° °It is common to feel: °· Nauseous. °· Cold or shivery. °· Sleepy. °· Tired. °· Sore or achy, even in parts of your body where you did not have surgery. ° °Follow these instructions at home: °For at least 24 hours after the procedure: °· Do not: °? Participate in activities where you could fall or become injured. °? Drive. °? Use heavy machinery. °? Drink alcohol. °? Take sleeping pills or medicines that cause drowsiness. °? Make important decisions or sign legal documents. °? Take care of children on your own. °· Rest. °Eating and drinking °· If you vomit, drink water, juice, or soup when you can drink without vomiting. °· Drink enough fluid to keep your urine clear or pale yellow. °· Make sure you have little or no nausea before eating solid foods. °· Follow the diet recommended by your health care provider. °General instructions °· Have a responsible adult stay with you until you are awake and alert. °· Return to your normal activities as told by your health care provider. Ask your health care provider what activities are safe for you. °· Take over-the-counter and prescription medicines only as told by your health care provider. °· If you smoke, do not smoke without supervision. °· Keep all follow-up visits as told by your health care provider. This is important. °Contact a health care provider if: °· You continue to have nausea or vomiting at home, and medicines are not helpful. °· You  cannot drink fluids or start eating again. °· You cannot urinate after 8-12 hours. °· You develop a skin rash. °· You have fever. °· You have increasing redness at the site of your procedure. °Get help right away if: °· You have difficulty breathing. °· You have chest pain. °· You have unexpected bleeding. °· You feel that you are having a life-threatening or urgent problem. °This information is not intended to replace advice given to you by your health care provider. Make sure you discuss any questions you have with your health care provider. °Document Released: 05/29/2000 Document Revised: 07/26/2015 Document Reviewed: 02/04/2015 °Elsevier Interactive Patient Education © 2018 [employer] ° °

## 2017-08-22 ENCOUNTER — Encounter
Admission: RE | Disposition: A | Discharge status: Home / Self Care | Payer: Self-pay | Source: Ambulatory Visit | Attending: Gastroenterology

## 2017-08-22 ENCOUNTER — Ambulatory Visit: Payer: BC Managed Care – PPO | Admitting: Anesthesiology

## 2017-08-22 ENCOUNTER — Ambulatory Visit
Admission: RE | Admit: 2017-08-22 | Discharge: 2017-08-22 | Disposition: A | Discharge status: Home / Self Care | Payer: BC Managed Care – PPO | Source: Ambulatory Visit | Attending: Gastroenterology | Admitting: Gastroenterology

## 2017-08-22 DIAGNOSIS — Z1211 Encounter for screening for malignant neoplasm of colon: Secondary | ICD-10-CM | POA: Insufficient documentation

## 2017-08-22 DIAGNOSIS — E785 Hyperlipidemia, unspecified: Secondary | ICD-10-CM | POA: Diagnosis not present

## 2017-08-22 DIAGNOSIS — D123 Benign neoplasm of transverse colon: Secondary | ICD-10-CM | POA: Diagnosis not present

## 2017-08-22 DIAGNOSIS — K219 Gastro-esophageal reflux disease without esophagitis: Secondary | ICD-10-CM | POA: Insufficient documentation

## 2017-08-22 DIAGNOSIS — Z79899 Other long term (current) drug therapy: Secondary | ICD-10-CM | POA: Diagnosis not present

## 2017-08-22 DIAGNOSIS — D122 Benign neoplasm of ascending colon: Secondary | ICD-10-CM | POA: Insufficient documentation

## 2017-08-22 HISTORY — PX: COLONOSCOPY WITH PROPOFOL: SHX5780

## 2017-08-22 HISTORY — DX: Meniere's disease, unspecified ear: H81.09

## 2017-08-22 HISTORY — PX: POLYPECTOMY: SHX5525

## 2017-08-22 HISTORY — DX: Unspecified osteoarthritis, unspecified site: M19.90

## 2017-08-22 SURGERY — COLONOSCOPY WITH PROPOFOL
Anesthesia: General | Site: Rectum | Wound class: Contaminated

## 2017-08-22 MED ORDER — LACTATED RINGERS IV SOLN
INTRAVENOUS | Status: DC
  Administered 2017-08-22 (×3): via INTRAVENOUS

## 2017-08-22 MED ORDER — PROPOFOL 10 MG/ML IV BOLUS
INTRAVENOUS | Status: DC | PRN
  Administered 2017-08-22 (×13): 20 mg via INTRAVENOUS
  Administered 2017-08-22: 60 mg via INTRAVENOUS
  Administered 2017-08-22 (×5): 20 mg via INTRAVENOUS
  Administered 2017-08-22 (×2): 40 mg via INTRAVENOUS
  Administered 2017-08-22 (×5): 20 mg via INTRAVENOUS

## 2017-08-22 MED ORDER — LIDOCAINE HCL (CARDIAC) PF 100 MG/5ML IV SOSY
PREFILLED_SYRINGE | INTRAVENOUS | Status: DC | PRN
  Administered 2017-08-22: 40 mg via INTRAVENOUS

## 2017-08-22 MED ORDER — STERILE WATER FOR IRRIGATION IR SOLN
Status: DC | PRN
  Administered 2017-08-22: .5 mL

## 2017-08-22 MED ORDER — ACETAMINOPHEN 325 MG PO TABS: 325.0000 mg | ORAL_TABLET | ORAL | Status: DC | PRN

## 2017-08-22 MED ORDER — ACETAMINOPHEN 160 MG/5ML PO SOLN: 325.0000 mg | ORAL | Status: DC | PRN

## 2017-08-22 SURGICAL SUPPLY — 17 items
CANISTER SUCT 1200ML W/VALVE (MISCELLANEOUS) ×3 IMPLANT
ELECT REM PT RETURN 9FT ADLT (ELECTROSURGICAL) ×3
ELECTRODE REM PT RTRN 9FT ADLT (ELECTROSURGICAL) ×2 IMPLANT
ELEVIEW  INJECTABLE COMP 10 (MISCELLANEOUS) ×1
GOWN CVR UNV OPN BCK APRN NK (MISCELLANEOUS) ×4 IMPLANT
GOWN ISOL THUMB LOOP REG UNIV (MISCELLANEOUS) ×2
INJECTABLE ELEVIEW COMP 10 (MISCELLANEOUS) ×2 IMPLANT
INJECTOR VARIJECT VIN23 (MISCELLANEOUS) ×2 IMPLANT
KIT ENDO PROCEDURE OLY (KITS) ×3 IMPLANT
MARKER SPOT ENDO TATTOO 5ML (MISCELLANEOUS) ×2 IMPLANT
RETRIEVER NET ROTH 2.5X230 LF (MISCELLANEOUS) ×3 IMPLANT
SNARE SHORT THROW 13M SML OVAL (MISCELLANEOUS) ×3 IMPLANT
SNARE SPIRAL (MISCELLANEOUS) ×6 IMPLANT
SPOT EX ENDOSCOPIC TATTOO (MISCELLANEOUS) ×1
TRAP ETRAP POLY (MISCELLANEOUS) ×3 IMPLANT
VARIJECT INJECTOR VIN23 (MISCELLANEOUS) ×3
WATER STERILE IRR 250ML POUR (IV SOLUTION) ×3 IMPLANT

## 2017-08-22 NOTE — Anesthesia Preprocedure Evaluation (Signed)
Anesthesia Evaluation  Patient identified by MRN, date of birth, ID band Patient awake    Reviewed: Allergy & Precautions, H&P , NPO status , Patient's Chart, lab work & pertinent test results, reviewed documented beta blocker date and time   Airway Mallampati: II  TM Distance: >3 FB Neck ROM: full    Dental no notable dental hx.    Pulmonary neg pulmonary ROS,    Pulmonary exam normal breath sounds clear to auscultation       Cardiovascular Exercise Tolerance: Good hypertension, Normal cardiovascular exam Rhythm:regular Rate:Normal     Neuro/Psych negative neurological ROS  negative psych ROS   GI/Hepatic Neg liver ROS, GERD  ,  Endo/Other  negative endocrine ROS  Renal/GU negative Renal ROS  negative genitourinary   Musculoskeletal   Abdominal   Peds  Hematology negative hematology ROS (+)   Anesthesia Other Findings   Reproductive/Obstetrics negative OB ROS                             Anesthesia Physical Anesthesia Plan  ASA: II  Anesthesia Plan: General   Post-op Pain Management:    Induction:   PONV Risk Score and Plan: 3 and Ondansetron  Airway Management Planned:   Additional Equipment:   Intra-op Plan:   Post-operative Plan:   Informed Consent: I have reviewed the patients History and Physical, chart, labs and discussed the procedure including the risks, benefits and alternatives for the proposed anesthesia with the patient or authorized representative who has indicated his/her understanding and acceptance.   Dental Advisory Given  Plan Discussed with: CRNA and Anesthesiologist  Anesthesia Plan Comments:         Anesthesia Quick Evaluation

## 2017-08-22 NOTE — Anesthesia Procedure Notes (Signed)
Procedure Name: MAC Date/Time: 08/22/2017 9:20 AM Performed by: Lind Guest, CRNA Pre-anesthesia Checklist: Patient identified, Emergency Drugs available, Suction available, Timeout performed and Patient being monitored Patient Re-evaluated:Patient Re-evaluated prior to induction Oxygen Delivery Method: Nasal cannula

## 2017-08-22 NOTE — Anesthesia Postprocedure Evaluation (Signed)
Anesthesia Post Note  Patient: Vanessa Walton  Procedure(s) Performed: COLONOSCOPY WITH PROPOFOL (N/A Rectum) POLYPECTOMY (Rectum)  Patient location during evaluation: PACU Anesthesia Type: General Level of consciousness: awake and alert Pain management: pain level controlled Vital Signs Assessment: post-procedure vital signs reviewed and stable Respiratory status: spontaneous breathing, nonlabored ventilation, respiratory function stable and patient connected to nasal cannula oxygen Cardiovascular status: blood pressure returned to baseline and stable Postop Assessment: no apparent nausea or vomiting Anesthetic complications: no    Trecia Rogers

## 2017-08-22 NOTE — Op Note (Signed)
[facility] Gastroenterology Patient Name: Vanessa Walton Procedure Date: 08/22/2017 9:24 AM MRN: [id number] Account #: [id number] Date of Birth: [birth year] Admit Type: Outpatient Age: 67 Room: [facility] OR ROOM 01 Gender: Female Note Status: Finalized Procedure:            Colonoscopy Indications:          Screening for colorectal malignant neoplasm, Last                        colonoscopy 10 years ago Providers:            Lin Landsman MD, MD Referring MD:         Jearld Fenton (Referring MD) Medicines:            Monitored Anesthesia Care Complications:        No immediate complications. Estimated blood loss: None. Procedure:            Pre-Anesthesia Assessment:                       - Prior to the procedure, a History and Physical was                        performed, and patient medications and allergies were                        reviewed. The patient is competent. The risks and                        benefits of the procedure and the sedation options and                        risks were discussed with the patient. All questions                        were answered and informed consent was obtained.                        Patient identification and proposed procedure were                        verified by the physician, the nurse, the                        anesthesiologist, the anesthetist and the technician in                        the pre-procedure area in the procedure room in the                        endoscopy suite. Mental Status Examination: alert and                        oriented. Airway Examination: normal oropharyngeal                        airway and neck mobility. Respiratory Examination:                        clear to auscultation. CV Examination: normal.  Prophylactic Antibiotics: The patient does not require                        prophylactic antibiotics. Prior Anticoagulants: The   patient has taken no previous anticoagulant or                        antiplatelet agents. ASA Grade Assessment: II - A                        patient with mild systemic disease. After reviewing the                        risks and benefits, the patient was deemed in                        satisfactory condition to undergo the procedure. The                        anesthesia plan was to use monitored anesthesia care                        (MAC). Immediately prior to administration of                        medications, the patient was re-assessed for adequacy                        to receive sedatives. The heart rate, respiratory rate,                        oxygen saturations, blood pressure, adequacy of                        pulmonary ventilation, and response to care were                        monitored throughout the procedure. The physical status                        of the patient was re-assessed after the procedure.                       After obtaining informed consent, the colonoscope was                        passed under direct vision. Throughout the procedure,                        the patient's blood pressure, pulse, and oxygen                        saturations were monitored continuously. The was                        introduced through the anus and advanced to the the                        cecum, identified by appendiceal orifice and ileocecal  valve. The colonoscopy was performed without                        difficulty. The patient tolerated the procedure well.                        The quality of the bowel preparation was evaluated                        using the BBPS [facility] Bowel Preparation Scale) with                        scores of: Right Colon = 3, Transverse Colon = 3 and                        Left Colon = 3 (entire mucosa seen well with no                        residual staining, small fragments of stool or opaque                         liquid). The total BBPS score equals 9. Findings:      The perianal and digital rectal examinations were normal. Pertinent       negatives include normal sphincter tone and no palpable rectal lesions.      A 12 mm polyp was found in the ascending colon. The polyp was flat.       Preparations were made for mucosal resection. Eleview was injected to       raise the lesion. Snare mucosal resection was performed. Resection and       retrieval were complete. Area was tattooed with an injection of Spot       (carbon black).      A 15 mm polyp was found in the transverse colon. The polyp was flat.       Preparations were made for mucosal resection. Eleview was injected to       raise the lesion. Snare mucosal resection was performed. Resection and       retrieval were complete. Area was tattooed with an injection of Spot       (carbon black).      The retroflexed view of the distal rectum and anal verge was normal and       showed no anal or rectal abnormalities. Impression:           - One 12 mm polyp in the ascending colon, removed with                        mucosal resection. Resected and retrieved. Tattooed.                       - One 15 mm polyp in the transverse colon, removed with                        mucosal resection. Resected and retrieved. Tattooed.                       - The distal rectum and anal verge are normal on  retroflexion view.                       - Mucosal resection was performed. Resection and                        retrieval were complete.                       - Mucosal resection was performed. Resection and                        retrieval were complete. Recommendation:       - Discharge patient to home (with escort).                       - Resume previous diet today.                       - Continue present medications.                       - Await pathology results.                       - Repeat colonoscopy in 3 years or  sooner for                        surveillance based on pathology results. Procedure Code(s):    --- Professional ---                       [phone], Colonoscopy, flexible; with endoscopic mucosal                        resection Diagnosis Code(s):    --- Professional ---                       Z12.11, Encounter for screening for malignant neoplasm                        of colon                       D12.2, Benign neoplasm of ascending colon                       D12.3, Benign neoplasm of transverse colon (hepatic                        flexure or splenic flexure) CPT copyright 2017 American Medical Association. All rights reserved. The codes documented in this report are preliminary and upon coder review may  be revised to meet current compliance requirements. Dr. Ulyess Mort Lin Landsman MD, MD 08/22/2017 10:18:47 AM This report has been signed electronically. Number of Addenda: 0 Note Initiated On: 08/22/2017 9:24 AM Scope Withdrawal Time: 0 hours 42 minutes 1 second  Total Procedure Duration: 0 hours 46 minutes 35 seconds       [facility]

## 2017-08-22 NOTE — H&P (Signed)
Vanessa Darby, MD [address]  [place]  [place], [place] [address]  Main: [phone]  Fax: [phone] Pager: [phone]  Primary Care Physician:  Jearld Fenton, NP Primary Gastroenterologist:  Dr. Cephas Walton  Pre-Procedure History & Physical: HPI:  Vanessa Walton is a 67 y.o. female is here for an colonoscopy.   Past Medical History:  Diagnosis Date  . Arthritis    right index finger  . GERD (gastroesophageal reflux disease)   . Hyperlipidemia   . Meniere's disease     Past Surgical History:  Procedure Laterality Date  . CESAREAN SECTION    . TONSILLECTOMY      Prior to Admission medications   Medication Sig Start Date End Date Taking? Authorizing Provider  B Complex Vitamins (B COMPLEX PO) Take by mouth daily.   Yes [provider]  ezetimibe (ZETIA) 10 MG tablet TAKE 1 TABLET (10 MG TOTAL) BY MOUTH DAILY 06/07/17  Yes Baity, Coralie Keens, NP  GLUCOSAMINE CHONDROITIN COMPLX PO Take by mouth daily.   Yes [provider]  Omega-3 Fatty Acids (FISH OIL DOUBLE STRENGTH) 1200 MG CAPS Take by mouth 2 (two) times daily.   Yes [provider]  ranitidine (ZANTAC) 150 MG tablet  03/09/16  Yes [provider]  rosuvastatin (CRESTOR) 20 MG tablet Take 1 tablet (20 mg total) by mouth daily. 06/07/17  Yes Baity, Coralie Keens, NP  diazepam (VALIUM) 2 MG tablet Take 1 tablet (2 mg total) by mouth as needed for anxiety. 05/24/17   Jearld Fenton, NP    Allergies as of 08/03/2017  . (No Known Allergies)    Family History  Problem Relation Age of Onset  . Arthritis Mother   . Hyperlipidemia Mother   . Hyperlipidemia Father   . Breast cancer Neg Hx     Social History   Socioeconomic History  . Marital status: Married    Spouse name: Not on file  . Number of children: Not on file  . Years of education: Not on file  . Highest education level: Not on file  Occupational History  . Not on file  Social Needs  . Financial resource  strain: Not on file  . Food insecurity:    Worry: Not on file    Inability: Not on file  . Transportation needs:    Medical: Not on file    Non-medical: Not on file  Tobacco Use  . Smoking status: Never Smoker  . Smokeless tobacco: Never Used  . Tobacco comment: smoked socially as teenager  Substance and Sexual Activity  . Alcohol use: Yes    Alcohol/week: 3.6 oz    Types: 6 Glasses of wine per week    Comment: moderate  . Drug use: No  . Sexual activity: Yes    Birth control/protection: None  Lifestyle  . Physical activity:    Days per week: Not on file    Minutes per session: Not on file  . Stress: Not on file  Relationships  . Social connections:    Talks on phone: Not on file    Gets together: Not on file    Attends religious service: Not on file    Active member of club or organization: Not on file    Attends meetings of clubs or organizations: Not on file    Relationship status: Not on file  . Intimate partner violence:    Fear of current or ex partner: Not on file    Emotionally  abused: Not on file    Physically abused: Not on file    Forced sexual activity: Not on file  Other Topics Concern  . Not on file  Social History Narrative  . Not on file    Review of Systems: See HPI, otherwise negative ROS  Physical Exam: BP 110/70   Pulse 80   Temp (!) 97.5 F (36.4 C) (Temporal)   Resp 16   Ht 4\' 11"  (1.499 m)   Wt 141 lb (64 kg)   SpO2 98%   BMI 28.48 kg/m  General:   Alert,  pleasant and cooperative in NAD Head:  Normocephalic and atraumatic. Neck:  Supple; no masses or thyromegaly. Lungs:  Clear throughout to auscultation.    Heart:  Regular rate and rhythm. Abdomen:  Soft, nontender and nondistended. Normal bowel sounds, without guarding, and without rebound.   Neurologic:  Alert and  oriented x4;  grossly normal neurologically.  Impression/Plan: [name] is here for an colonoscopy to be performed for colon cancer screening  Risks,  benefits, limitations, and alternatives regarding  colonoscopy have been reviewed with the patient.  Questions have been answered.  All parties agreeable.   Sherri Sear, MD  08/22/2017, 7:59 AM

## 2017-08-22 NOTE — Transfer of Care (Signed)
Immediate Anesthesia Transfer of Care Note  Patient: Ethelean Colla  Procedure(s) Performed: COLONOSCOPY WITH PROPOFOL (N/A Rectum) POLYPECTOMY (Rectum)  Patient Location: PACU  Anesthesia Type: General  Level of Consciousness: awake, alert  and patient cooperative  Airway and Oxygen Therapy: Patient Spontanous Breathing and Patient connected to supplemental oxygen  Post-op Assessment: Post-op Vital signs reviewed, Patient's Cardiovascular Status Stable, Respiratory Function Stable, Patent Airway and No signs of Nausea or vomiting  Post-op Vital Signs: Reviewed and stable  Complications: No apparent anesthesia complications

## 2017-08-23 ENCOUNTER — Encounter: Payer: Self-pay | Admitting: Gastroenterology

## 2018-03-27 ENCOUNTER — Other Ambulatory Visit: Payer: Self-pay | Admitting: Internal Medicine

## 2018-05-05 ENCOUNTER — Other Ambulatory Visit: Payer: Self-pay | Admitting: Internal Medicine

## 2018-06-25 ENCOUNTER — Telehealth: Payer: Self-pay | Admitting: Internal Medicine

## 2018-09-19 ENCOUNTER — Other Ambulatory Visit: Payer: Self-pay | Admitting: Internal Medicine

## 2018-09-19 NOTE — Telephone Encounter (Signed)
Please advise, Pt states that she is taking Crestor. Thanks

## 2018-10-31 ENCOUNTER — Ambulatory Visit (INDEPENDENT_AMBULATORY_CARE_PROVIDER_SITE_OTHER): Payer: Medicare Other

## 2018-10-31 DIAGNOSIS — Z23 Encounter for immunization: Secondary | ICD-10-CM | POA: Diagnosis not present

## 2018-11-28 ENCOUNTER — Encounter: Payer: BC Managed Care – PPO | Admitting: Internal Medicine

## 2018-12-03 ENCOUNTER — Other Ambulatory Visit: Payer: Self-pay | Admitting: Internal Medicine

## 2018-12-03 DIAGNOSIS — Z1231 Encounter for screening mammogram for malignant neoplasm of breast: Secondary | ICD-10-CM

## 2018-12-17 ENCOUNTER — Encounter: Payer: Medicare Other | Admitting: Internal Medicine

## 2019-01-08 ENCOUNTER — Ambulatory Visit
Admission: RE | Admit: 2019-01-08 | Discharge: 2019-01-08 | Disposition: A | Discharge status: Home / Self Care | Payer: Medicare Other | Source: Ambulatory Visit | Attending: Internal Medicine | Admitting: Internal Medicine

## 2019-01-08 DIAGNOSIS — Z1231 Encounter for screening mammogram for malignant neoplasm of breast: Secondary | ICD-10-CM | POA: Diagnosis present

## 2019-01-09 ENCOUNTER — Ambulatory Visit (INDEPENDENT_AMBULATORY_CARE_PROVIDER_SITE_OTHER)
Admission: RE | Admit: 2019-01-09 | Discharge: 2019-01-09 | Disposition: A | Discharge status: Home / Self Care | Payer: Medicare Other | Source: Ambulatory Visit

## 2019-01-09 ENCOUNTER — Other Ambulatory Visit: Payer: Self-pay

## 2019-01-09 ENCOUNTER — Encounter: Payer: Self-pay | Admitting: Internal Medicine

## 2019-01-09 DIAGNOSIS — Z20822 Contact with and (suspected) exposure to covid-19: Secondary | ICD-10-CM

## 2019-01-09 DIAGNOSIS — R05 Cough: Secondary | ICD-10-CM | POA: Diagnosis not present

## 2019-01-09 DIAGNOSIS — R059 Cough, unspecified: Secondary | ICD-10-CM

## 2019-01-09 MED ORDER — PREDNISONE 50 MG PO TABS: 50.0000 mg | ORAL_TABLET | Freq: Every day | ORAL | 0 refills | Status: DC

## 2019-01-09 MED ORDER — DOXYCYCLINE HYCLATE 100 MG PO CAPS
100.0000 mg | ORAL_CAPSULE | Freq: Two times a day (BID) | ORAL | 0 refills | Status: DC
Start: 2019-01-09 — End: 2019-02-13

## 2019-01-09 NOTE — ED Provider Notes (Signed)
Virtual Visit via Video Note:  Vanessa Walton  initiated request for Telemedicine visit with [facility] Urgent Care team. I connected with Vanessa Walton  on 01/09/2019 at 11:55 AM  for a synchronized telemedicine visit using a video enabled HIPPA compliant telemedicine application. I verified that I am speaking with Vanessa Walton  using two identifiers. Amy Jodell Cipro, PA-C  was physically located in a [facility] Urgent care site and Vanessa Walton was located at a different location.   The limitations of evaluation and management by telemedicine as well as the availability of in-person appointments were discussed. Patient was informed that she  may incur a bill ( including co-pay) for this virtual visit encounter. Vanessa Walton  expressed understanding and gave verbal consent to proceed with virtual visit.     History of Present Illness:Vanessa Walton  is a 68 y.o. female presents with 5-7 day history of URI symptoms. Has had sinus headache, cough, rhinorrhea, nasal congestion. Denies fever, chills, body aches. Denies abdominal pain, nausea, vomiting, diarrhea. Denies loss of taste/smell. Short of breath with exertion. Denies chest pain, leg swelling. Former smoker.  No obvious sick/Covid contact.  Although patient lives with daughter, who is a Marine scientist on Covid floor. Daughter is asymptomatic.    Past Medical History:  Diagnosis Date  . Arthritis    right index finger  . GERD (gastroesophageal reflux disease)   . Hyperlipidemia   . Meniere's disease     No Known Allergies      Observations/Objective: General: Well appearing, nontoxic, no acute distress. Sitting comfortably. Head: Normocephalic, atraumatic Eye: No conjunctival injection, eyelid swelling. EOMI ENT: Mucus membranes moist, no lip cracking. No obvious nasal drainage. Pulm: Speaking in full sentences without difficulty. Normal effort. No respiratory distress, accessory muscle use. Bronchitic cough throughout exam. Neuro: Normal mental  status. Alert and oriented x 3.  Assessment and Plan: Discussed with patient, cannot rule out Covid, will send for testing.  Given former smoker with bronchitic cough, we will start prednisone as directed.  If symptoms not significantly improved with prednisone in 1 to 2 days, to fill doxycycline to cover for bronchitis.  Patient to remain in quarantine until testing results return.  Return precautions given.  Patient expresses understanding and agrees to plan.  Follow Up Instructions:    I discussed the assessment and treatment plan with the patient. The patient was provided an opportunity to ask questions and all were answered. The patient agreed with the plan and demonstrated an understanding of the instructions.   The patient was advised to call back or seek an in-person evaluation if the symptoms worsen or if the condition fails to improve as anticipated.  I provided 15 minutes of non-face-to-face time during this encounter.    Ok Edwards, PA-C  01/09/2019 11:55 AM         Ok Edwards, PA-C 01/09/19 1155

## 2019-01-09 NOTE — Discharge Instructions (Addendum)
As discussed, cannot rule out COVID. Currently, no alarming signs. Testing ordered. Please drive to [id number] 8am-3pm today or tomorrow for testing. I would like you to quarantine until testing results. Start prednisone a directed. If coughing isn't improving in 2-3 days, can fill doxycycline to cover for bacterial infection to the lungs.You can take over the counter flonase/nasacort to help with nasal congestion/drainage. If experiencing shortness of breath, trouble breathing, go to the emergency department for further evaluation needed.

## 2019-01-11 LAB — NOVEL CORONAVIRUS, NAA: SARS-CoV-2, NAA: NOT DETECTED

## 2019-01-17 ENCOUNTER — Encounter: Payer: Medicare Other | Admitting: Internal Medicine

## 2019-02-13 ENCOUNTER — Ambulatory Visit (INDEPENDENT_AMBULATORY_CARE_PROVIDER_SITE_OTHER): Payer: Medicare Other | Admitting: Internal Medicine

## 2019-02-13 ENCOUNTER — Other Ambulatory Visit: Payer: Self-pay

## 2019-02-13 ENCOUNTER — Encounter: Payer: Self-pay | Admitting: Internal Medicine

## 2019-02-13 VITALS — BP 128/76 | HR 63 | Temp 97.7°F | Ht 58.5 in | Wt 135.0 lb

## 2019-02-13 DIAGNOSIS — K219 Gastro-esophageal reflux disease without esophagitis: Secondary | ICD-10-CM | POA: Diagnosis not present

## 2019-02-13 DIAGNOSIS — Z0000 Encounter for general adult medical examination without abnormal findings: Secondary | ICD-10-CM | POA: Diagnosis not present

## 2019-02-13 DIAGNOSIS — H6901 Patulous Eustachian tube, right ear: Secondary | ICD-10-CM

## 2019-02-13 DIAGNOSIS — H8109 Meniere's disease, unspecified ear: Secondary | ICD-10-CM | POA: Insufficient documentation

## 2019-02-13 DIAGNOSIS — M19049 Primary osteoarthritis, unspecified hand: Secondary | ICD-10-CM | POA: Insufficient documentation

## 2019-02-13 DIAGNOSIS — E782 Mixed hyperlipidemia: Secondary | ICD-10-CM | POA: Diagnosis not present

## 2019-02-13 HISTORY — DX: Meniere's disease, unspecified ear: H81.09

## 2019-02-13 LAB — VITAMIN D 25 HYDROXY (VIT D DEFICIENCY, FRACTURES): VITD: 42.47 ng/mL (ref 30.00–100.00)

## 2019-02-13 LAB — COMPREHENSIVE METABOLIC PANEL
ALT: 38 U/L — ABNORMAL HIGH (ref 0–35)
AST: 35 U/L (ref 0–37)
Albumin: 4.8 g/dL (ref 3.5–5.2)
Alkaline Phosphatase: 37 U/L — ABNORMAL LOW (ref 39–117)
BUN: 24 mg/dL — ABNORMAL HIGH (ref 6–23)
CO2: 23 mEq/L (ref 19–32)
Calcium: 9.9 mg/dL (ref 8.4–10.5)
Chloride: 105 mEq/L (ref 96–112)
Creatinine, Ser: 0.74 mg/dL (ref 0.40–1.20)
GFR: 78.01 mL/min (ref >60.00)
Glucose, Bld: 93 mg/dL (ref 70–99)
Potassium: 4.6 mEq/L (ref 3.5–5.1)
Sodium: 138 mEq/L (ref 135–145)
Total Bilirubin: 0.6 mg/dL (ref 0.2–1.2)
Total Protein: 7.4 g/dL (ref 6.0–8.3)

## 2019-02-13 LAB — CBC
HCT: 37.7 % (ref 36.0–46.0)
Hemoglobin: 12.7 g/dL (ref 12.0–15.0)
MCHC: 33.8 g/dL (ref 30.0–36.0)
MCV: 96.7 fl (ref 78.0–100.0)
Platelets: 194 10*3/uL (ref 150.0–400.0)
RBC: 3.9 Mil/uL (ref 3.87–5.11)
RDW: 13.7 % (ref 11.5–15.5)
WBC: 6 10*3/uL (ref 4.0–10.5)

## 2019-02-13 LAB — LIPID PANEL
Cholesterol: 225 mg/dL — ABNORMAL HIGH (ref 0–200)
HDL: 90.6 mg/dL (ref >39.00)
LDL Cholesterol: 113 mg/dL — ABNORMAL HIGH (ref 0–99)
NonHDL: 134.42
Total CHOL/HDL Ratio: 2
Triglycerides: 105 mg/dL (ref 0.0–149.0)
VLDL: 21 mg/dL (ref 0.0–40.0)

## 2019-02-13 NOTE — Assessment & Plan Note (Signed)
Some hearing loss Not interested in referral to audiology Will monitor

## 2019-02-13 NOTE — Assessment & Plan Note (Signed)
Continue Omeprazole as needed Will monitor 

## 2019-02-13 NOTE — Progress Notes (Signed)
HPI:  Pt presents to the clinic today for her subsequent annual Medicare Wellness Exam.  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 She reports decreased strength and grip bilaterally. She is taking Glucosamine and heat. She occassionally takes Ibuprofen OTC.  GERD: She is not sure what triggers this. She takes Omeprazole as needed with good relief of symptoms. There is no upper GI on file.  HLD: Her last LDL was 56, 05/2017. She is taking Rosuvastatin (1/2 tab daily), but has not been taking the Zetia as prescribed. She does take Fish Oil OTC. She does eat some fried foods.  Meniere's/Patulous ETD: Currently not an issue. She is not taking any medication for this at this time. She has seen ENT in the past for the same.  Past Medical History:  Diagnosis Date  . Arthritis    right index finger  . GERD (gastroesophageal reflux disease)   . Hyperlipidemia   . Meniere's disease     Current Outpatient Medications  Medication Sig Dispense Refill  . B Complex Vitamins (B COMPLEX PO) Take by mouth daily.    Marland Kitchen ezetimibe (ZETIA) 10 MG tablet TAKE 1 TABLET (10 MG TOTAL) BY MOUTH DAILY. MUST SCHEDULE ANNUAL PHYSICAL 90 tablet 0  . GLUCOSAMINE CHONDROITIN COMPLX PO Take by mouth daily.    . Omega-3 Fatty Acids (FISH OIL DOUBLE STRENGTH) 1200 MG CAPS Take by mouth 2 (two) times daily.    . rosuvastatin (CRESTOR) 20 MG tablet Take 1 tablet (20 mg total) by mouth daily. MUST SCHEDULE PHYSICAL EXAM 90 tablet 0   No current facility-administered medications for this visit.    No Known Allergies  Family History  Problem Relation Age of Onset  . Arthritis Mother   . Hyperlipidemia Mother   . Hyperlipidemia Father   . Breast cancer Neg Hx     Social History   Socioeconomic History  . Marital status: Married    Spouse name: Not on file  . Number of children: Not on file  . Years of education: Not on file  . Highest education level: Not on file  Occupational History  . Not on file  Tobacco Use  . Smoking  status: Never Smoker  . Smokeless tobacco: Never Used  . Tobacco comment: smoked socially as teenager  Substance and Sexual Activity  . Alcohol use: Yes    Alcohol/week: 6.0 standard drinks    Types: 6 Glasses of wine per week    Comment: moderate  . Drug use: No  . Sexual activity: Yes    Birth control/protection: None  Other Topics Concern  . Not on file  Social History Narrative  . Not on file   Social Determinants of Health   Financial Resource Strain:   . Difficulty of Paying Living Expenses: Not on file  Food Insecurity:   . Worried About Charity fundraiser in the Last Year: Not on file  . Ran Out of Food in the Last Year: Not on file  Transportation Needs:   . Lack of Transportation (Medical): Not on file  . Lack of Transportation (Non-Medical): Not on file  Physical Activity:   . Days of Exercise per Week: Not on file  . Minutes of Exercise per Session: Not on file  Stress:   . Feeling of Stress : Not on file  Social Connections:   . Frequency of Communication with Friends and Family: Not on file  . Frequency of Social Gatherings with Friends and Family: Not on file  . Attends Religious  Services: Not on file  . Active Member of Clubs or Organizations: Not on file  . Attends Archivist Meetings: Not on file  . Marital Status: Not on file  Intimate Partner Violence:   . Fear of Current or Ex-Partner: Not on file  . Emotionally Abused: Not on file  . Physically Abused: Not on file  . Sexually Abused: Not on file    Hospitiliaztions: None  Health Maintenance:    Flu: 10/2018  Tetanus: 05/2017  Pneumovax: 05/2017  Prevnar: 04/2016  Zostavax: 2017  Shingrix: never  Mammogram: 01/2019  Pap Smear: 11/2014  Bone Density: 05/2016  Colon Screening: 08/2017  Eye Doctor: annually  Dental Exam: biannually   Providers:   PCP: Webb Silversmith, NP  ENT: Dr. Deirdre Pippins    I have personally reviewed and have noted:  1. The patient's medical and social  history 2. Their use of alcohol, tobacco or illicit drugs 3. Their current medications and supplements 4. The patient's functional ability including ADL's, fall risks, home safety risks and hearing or visual impairment. 5. Diet and physical activities 6. Evidence for depression or mood disorder  Subjective:   Review of Systems:   Constitutional: Denies fever, malaise, fatigue, headache or abrupt weight changes.  HEENT: Pt reports bilateral hearing loss. Denies eye pain, eye redness, ear pain, ringing in the ears, wax buildup, runny nose, nasal congestion, bloody nose, or sore throat. Respiratory: Denies difficulty breathing, shortness of breath, cough or sputum production.   Cardiovascular: Denies chest pain, chest tightness, palpitations or swelling in the hands or feet.  Gastrointestinal: Denies abdominal pain, bloating, constipation, diarrhea or blood in the stool.  GU: Denies urgency, frequency, pain with urination, burning sensation, blood in urine, odor or discharge. Musculoskeletal: Pt reports joint pain in bilateral hands. Denies decrease in range of motion, difficulty with gait, muscle pain or joint swelling.  Skin: Denies redness, rashes, lesions or ulcercations.  Neurological: Denies dizziness, difficulty with memory, difficulty with speech or problems with balance and coordination.  Psych: Denies anxiety, depression, SI/HI.  No other specific complaints in a complete review of systems (except as listed in HPI above).  Objective:  PE:   BP 128/76   Pulse 63   Temp 97.7 F (36.5 C) (Temporal)   Ht 4' 10.5" (1.486 m)   Wt 135 lb (61.2 kg)   SpO2 98%   BMI 27.73 kg/m   Wt Readings from Last 3 Encounters:  08/22/17 141 lb (64 kg)  05/24/17 143 lb (64.9 kg)  12/07/16 143 lb (64.9 kg)    General: Appears her stated age, well developed, well nourished in NAD. Skin: Warm, dry and intact. No rashes noted. HEENT: Head: normal shape and size; Eyes: sclera white, no  icterus, conjunctiva pink, PERRLA and EOMs intact; Ears: Tm's gray and intact, normal light reflex;  Neck: Neck supple, trachea midline. No masses, lumps or thyromegaly present.  Cardiovascular: Normal rate and rhythm. S1,S2 noted.  No murmur, rubs or gallops noted. No JVD or BLE edema. No carotid bruits noted. Pulmonary/Chest: Normal effort and positive vesicular breath sounds. No respiratory distress. No wheezes, rales or ronchi noted.  Abdomen: Soft and nontender. Normal bowel sounds. No distention or masses noted. Liver, spleen and kidneys non palpable. Musculoskeletal: Herbens and Bouchard's noted of various fingers of the hand. Strength 5/5 BUE/BLE. Hand grips equal. No difficulty with gait. Neurological: Alert and oriented. Cranial nerves II-XII grossly intact. Coordination normal.  Psychiatric: Mood and affect normal. Behavior is normal. Judgment and  thought content normal.     BMET    Component Value Date/Time   NA 138 05/24/2017 1227   K 4.3 05/24/2017 1227   CL 104 05/24/2017 1227   CO2 22 05/24/2017 1227   GLUCOSE 93 05/24/2017 1227   BUN 19 05/24/2017 1227   CREATININE 1.03 05/24/2017 1227   CALCIUM 9.8 05/24/2017 1227    Lipid Panel     Component Value Date/Time   CHOL 156 05/24/2017 1227   TRIG 129.0 05/24/2017 1227   HDL 74.90 05/24/2017 1227   CHOLHDL 2 05/24/2017 1227   VLDL 25.8 05/24/2017 1227   LDLCALC 56 05/24/2017 1227    CBC    Component Value Date/Time   WBC 5.4 05/24/2017 1227   RBC 3.95 05/24/2017 1227   HGB 12.8 05/24/2017 1227   HCT 37.5 05/24/2017 1227   PLT 209.0 05/24/2017 1227   MCV 95.0 05/24/2017 1227   MCHC 34.2 05/24/2017 1227   RDW 13.2 05/24/2017 1227    Hgb A1C No results found for: HGBA1C    Assessment and Plan:   Medicare Annual Wellness Visit:  Diet: She does eat meats. She consumes fruits and veggies daily. She does eat some fried foods. She drinks mostly water, Dt. Coke, rare coffee Physical activity: Walking 4-5  miles 4 days per week Depression/mood screen: Negative, PHQ 9 score of 0 Hearing: Grossly intact, hx of hearing loss, not ready to see audiology Visual acuity: Grossly normal, performs annual eye exam  ADLs: Capable Fall risk: None Home safety: Good Cognitive evaluation: Intact to orientation, naming, recall and repetition EOL planning: No adv directives, full code/ I agree  Preventative Medicine: Flu, tetanus, pneumovax, prevnar and zostovax UTD. She will consider Shingrix vaccine and she is aware she needs to get this at the pharmacy. Mammogram, bone density and colon screening UTD. Pap smear due in 2021. Encouraged her to consume a balanced diet and exercise regimen. Advised her to see an eye doctor and dentist annually. Will check CBC, CMET, Lipid and Vit D today. Due dates for screening exam given to pt as part of her AVS.   Next appointment: 1 year, Medicare Wellness Exam   Webb Silversmith, NP

## 2019-02-13 NOTE — Assessment & Plan Note (Signed)
Currently not an issue Will monitor 

## 2019-02-13 NOTE — Patient Instructions (Signed)

## 2019-02-13 NOTE — Assessment & Plan Note (Signed)
Continue Glucosamine- Condroitin and heat She could consider Tumeric as well She will consider adding Meloxicam 15 mg PO daily, instead of Ibuprofen OTC

## 2019-02-13 NOTE — Assessment & Plan Note (Signed)
CMET and Lipid profile today Encouraged her to consume a low fat diet Will adjust Rosuvastatin if needed based on labs, will need refill Will see if she needs Zetia, pending labs Continue Fish Oil OTC

## 2019-02-14 ENCOUNTER — Encounter: Payer: Self-pay | Admitting: Internal Medicine

## 2019-02-14 ENCOUNTER — Other Ambulatory Visit: Payer: Self-pay | Admitting: Internal Medicine

## 2019-02-14 MED ORDER — ROSUVASTATIN CALCIUM 20 MG PO TABS: 20.0000 mg | ORAL_TABLET | Freq: Every day | ORAL | 2 refills | Status: DC

## 2019-02-14 MED ORDER — MELOXICAM 15 MG PO TABS: 15.0000 mg | ORAL_TABLET | Freq: Every day | ORAL | 2 refills | Status: DC

## 2019-04-03 ENCOUNTER — Ambulatory Visit: Payer: Medicare Other

## 2019-04-07 ENCOUNTER — Encounter: Payer: Self-pay | Admitting: Internal Medicine

## 2019-04-11 ENCOUNTER — Ambulatory Visit: Payer: Medicare PPO

## 2019-04-20 ENCOUNTER — Ambulatory Visit: Payer: Medicare Other

## 2019-05-27 ENCOUNTER — Encounter: Payer: Self-pay | Admitting: Internal Medicine

## 2019-09-22 ENCOUNTER — Other Ambulatory Visit: Payer: Self-pay

## 2019-09-22 ENCOUNTER — Ambulatory Visit
Admission: EM | Admit: 2019-09-22 | Discharge: 2019-09-22 | Disposition: A | Discharge status: Home / Self Care | Payer: Medicare PPO | Attending: Family Medicine | Admitting: Family Medicine

## 2019-09-22 DIAGNOSIS — R0981 Nasal congestion: Secondary | ICD-10-CM | POA: Insufficient documentation

## 2019-09-22 DIAGNOSIS — J029 Acute pharyngitis, unspecified: Secondary | ICD-10-CM | POA: Insufficient documentation

## 2019-09-22 DIAGNOSIS — H00015 Hordeolum externum left lower eyelid: Secondary | ICD-10-CM | POA: Insufficient documentation

## 2019-09-22 LAB — POCT RAPID STREP A (OFFICE): Rapid Strep A Screen: NEGATIVE

## 2019-09-22 MED ORDER — ERYTHROMYCIN 5 MG/GM OP OINT: TOPICAL_OINTMENT | OPHTHALMIC | 0 refills | Status: DC

## 2019-09-22 NOTE — Discharge Instructions (Addendum)
Have sent erythromycin ointment to your pharmacy, you may use half inch ribbon of this to your left lower lid 5-6 times a day for 5 days or until symptoms improve  May continue warm compresses at home  Continue to keep the eyelashes and eyelids very clean  Follow-up with ophthalmology tomorrow  Strep test was negative.  We will culture your sample and will call in antibiotics if you are positive.  We will also let you know if we need to start antibiotics.  May use ibuprofen or Tylenol over-the-counter for throat pain  May use Mucinex for congestion  Follow-up with primary care as needed  The ER for sudden vision loss, trouble swallowing, trouble breathing, other concerning symptoms

## 2019-09-22 NOTE — ED Triage Notes (Signed)
Pt reports having a sore throat and congestion since Saturday.   Pt also reports having a stye on her L eye that has been there since June 1.

## 2019-09-22 NOTE — ED Provider Notes (Signed)
[place]   [id number] 09/22/19 Arrival Time: 1016  CC: EYE REDNESS  SUBJECTIVE:  Vanessa Walton is a 69 y.o. female who presents with complaint of stye that has been present to the left lower lid since 08/05/2019.  Reports that she has been using warm compresses, artificial tear eyedrops, and it is still not going away.  She reports that when she wakes up in the morning that her eyes are matted with green discharge.  Also reports that she has been experiencing nasal congestion and sore throat for the last 3 days.  Reports that her grandchildren have strep throat.  Has not taken any medication for this.  Has had strep in the past. Denies fever, chills, nausea, vomiting, eye pain, painful eye movements, halos, discharge, itching, vision changes, double vision, FB sensation, periorbital erythema.     Denies contact lens use.    ROS: As per HPI.  All other pertinent ROS negative.     Past Medical History:  Diagnosis Date   Arthritis    right index finger   GERD (gastroesophageal reflux disease)    Hyperlipidemia    Meniere's disease    Past Surgical History:  Procedure Laterality Date   CESAREAN SECTION     COLONOSCOPY WITH PROPOFOL N/A 08/22/2017   Procedure: COLONOSCOPY WITH PROPOFOL;  Surgeon: Lin Landsman, MD;  Location: [place];  Service: Endoscopy;  Laterality: N/A;   POLYPECTOMY  08/22/2017   Procedure: POLYPECTOMY;  Surgeon: Lin Landsman, MD;  Location: [place];  Service: Endoscopy;;   TONSILLECTOMY     No Known Allergies No current facility-administered medications on file prior to encounter.   Current Outpatient Medications on File Prior to Encounter  Medication Sig Dispense Refill   B Complex Vitamins (B COMPLEX PO) Take by mouth daily.     Biotin 10000 MCG TABS Take by mouth.     Cholecalciferol (VITAMIN D3) 50 MCG (2000 UT) TABS Take by mouth.     ezetimibe (ZETIA) 10 MG tablet TAKE 1 TABLET (10 MG TOTAL) BY  MOUTH DAILY. MUST SCHEDULE ANNUAL PHYSICAL (Patient not taking: Reported on 02/13/2019) 90 tablet 0   GLUCOSAMINE CHONDROITIN COMPLX PO Take by mouth daily.     meloxicam (MOBIC) 15 MG tablet Take 1 tablet (15 mg total) by mouth daily. 30 tablet 2   Omega-3 Fatty Acids (FISH OIL DOUBLE STRENGTH) 1200 MG CAPS Take by mouth 2 (two) times daily.     Probiotic Product (PROBIOTIC DAILY PO) Take by mouth.     rosuvastatin (CRESTOR) 20 MG tablet Take 1 tablet (20 mg total) by mouth daily. 90 tablet 2   vitamin E 400 UNIT capsule Take 400 Units by mouth daily.     Social History   Socioeconomic History   Marital status: Married    Spouse name: Not on file   Number of children: Not on file   Years of education: Not on file   Highest education level: Not on file  Occupational History   Not on file  Tobacco Use   Smoking status: Never Smoker   Smokeless tobacco: Never Used   Tobacco comment: smoked socially as teenager  Vaping Use   Vaping Use: Never used  Substance and Sexual Activity   Alcohol use: Yes    Alcohol/week: 6.0 standard drinks    Types: 6 Glasses of wine per week    Comment: moderate   Drug use: No   Sexual activity: Yes    Birth control/protection: None  Other Topics Concern   Not on file  Social History Narrative   Not on file   Social Determinants of Health   Financial Resource Strain:    Difficulty of Paying Living Expenses:   Food Insecurity:    Worried About Charity fundraiser in the Last Year:    Arboriculturist in the Last Year:   Transportation Needs:    Film/video editor (Medical):    Lack of Transportation (Non-Medical):   Physical Activity:    Days of Exercise per Week:    Minutes of Exercise per Session:   Stress:    Feeling of Stress :   Social Connections:    Frequency of Communication with Friends and Family:    Frequency of Social Gatherings with Friends and Family:    Attends Religious Services:     Active Member of Clubs or Organizations:    Attends Music therapist:    Marital Status:   Intimate Partner Violence:    Fear of Current or Ex-Partner:    Emotionally Abused:    Physically Abused:    Sexually Abused:    Family History  Problem Relation Age of Onset   Arthritis Mother    Hyperlipidemia Mother    Hyperlipidemia Father    Breast cancer Neg Hx     OBJECTIVE:   Vitals:   09/22/19 1025 09/22/19 1026  BP: (!) 132/98   Pulse: 72   Resp: 16   Temp: 98.1 F (36.7 C)   TempSrc: Oral   SpO2: 95%   Weight:  134 lb 14.7 oz (61.2 kg)  Height:  4' 10.5" (1.486 m)    General appearance: alert; no distress Eyes: No conjunctival erythema, hordeolum present to left lower outer lid. PERRL; EOMI without discomfort;  no obvious drainage; lid everted without obvious FB Throat: Mildly erythematous, tonsils not swollen, no exudate present Sinuses: Nontender Neck: supple Lungs: clear to auscultation bilaterally Heart: regular rate and rhythm Skin: warm and dry Psychological: alert and cooperative; normal mood and affect   ASSESSMENT & PLAN:  1. Hordeolum externum of left lower eyelid   2. Nasal congestion   3. Sore throat     Meds ordered this encounter  Medications   erythromycin ophthalmic ointment    Sig: Place a 1/2 inch ribbon of ointment into the lower eyelid.    Dispense:  3.5 g    Refill:  0    Order Specific Question:   Supervising Provider    Answer:   Chase Picket [id number]     STYE: Continue warm compresses at home.  Soak a wash cloth in warm (not scalding) water and place it over the eyes. As the wash cloth cools, it should be rewarmed and replaced for a total of 5 to 10 minutes of soaking time. Warm compresses should be applied two to four times a day as long as the patient has symptoms Perform lid washing: Either warm water or very dilute baby shampoo can be placed on a clean wash cloth, gauze pad, or cotton swab. Then  be advised to gently clean along the lashes and lid margin to remove the accumulated material with care to avoid contacting the ocular surface. If shampoo is used, thorough rinsing is recommended. Vigorous washing should be avoided, as it may cause more irritation.  Prescribed erythromycin ointment.  Apply up to 6 times daily for 5-7 days, or until symptomatic improvement Follow up with ophthalmology for further evaluation and management if  symptoms persists  Strep test negative. Culture pending Will inform of positive results and treat accordingly Use ibuprofen or Tylenol as needed for throat pain Use Mucinex over-the-counter for nasal congestion  Return or go to ER if you have any new or worsening symptoms such as fever, chills, redness, swelling, eye pain, painful eye movements, vision changes.  Reviewed expectations re: course of current medical issues. Questions answered. Outlined signs and symptoms indicating need for more acute intervention. Patient verbalized understanding. After Visit Summary given.    Faustino Congress, NP 09/22/19 1101

## 2019-09-24 LAB — CULTURE, GROUP A STREP (THRC)

## 2019-09-26 ENCOUNTER — Encounter: Payer: Self-pay | Admitting: Internal Medicine

## 2019-09-29 ENCOUNTER — Ambulatory Visit: Payer: Medicare PPO | Admitting: Internal Medicine

## 2019-09-29 ENCOUNTER — Other Ambulatory Visit: Payer: Self-pay

## 2019-09-29 ENCOUNTER — Encounter: Payer: Self-pay | Admitting: Internal Medicine

## 2019-09-29 VITALS — BP 128/78 | HR 75 | Temp 97.4°F | Wt 137.0 lb

## 2019-09-29 DIAGNOSIS — M6530 Trigger finger, unspecified finger: Secondary | ICD-10-CM

## 2019-09-29 DIAGNOSIS — H04123 Dry eye syndrome of bilateral lacrimal glands: Secondary | ICD-10-CM

## 2019-09-29 DIAGNOSIS — H00015 Hordeolum externum left lower eyelid: Secondary | ICD-10-CM

## 2019-09-29 NOTE — Progress Notes (Signed)
Subjective:    Patient ID: Vanessa Walton, female    DOB: 07/24/1950, 69 y.o.   MRN: [id number]  HPI  Patient presents the clinic today for urgent care follow-up.  She went to urgent care 7/19, with complaint of swelling to her left upper eyelid (that has been there for 1-2 months), nasal congestion and sore throat. Rapid strep test was negative. She was diagnosed with a stye and placed on erythromycin eye ointment.  She has not seen any improvement in this and think she needs to see an eye doctor. She reports history of dry eyes as well. She is requesting referral today.  She is also concerned about trigger finger of her 3rd and 4th fingers left hand. She reports they lock up from time to time. She is interested in getting an injection to see if it would help.  Review of Systems      Past Medical History:  Diagnosis Date  . Arthritis    right index finger  . GERD (gastroesophageal reflux disease)   . Hyperlipidemia   . Meniere's disease     Current Outpatient Medications  Medication Sig Dispense Refill  . B Complex Vitamins (B COMPLEX PO) Take by mouth daily.    . Biotin 10000 MCG TABS Take by mouth.    . Cholecalciferol (VITAMIN D3) 50 MCG (2000 UT) TABS Take by mouth.    . erythromycin ophthalmic ointment Place a 1/2 inch ribbon of ointment into the lower eyelid. 3.5 g 0  . ezetimibe (ZETIA) 10 MG tablet TAKE 1 TABLET (10 MG TOTAL) BY MOUTH DAILY. MUST SCHEDULE ANNUAL PHYSICAL (Patient not taking: Reported on 02/13/2019) 90 tablet 0  . GLUCOSAMINE CHONDROITIN COMPLX PO Take by mouth daily.    . meloxicam (MOBIC) 15 MG tablet Take 1 tablet (15 mg total) by mouth daily. 30 tablet 2  . Omega-3 Fatty Acids (FISH OIL DOUBLE STRENGTH) 1200 MG CAPS Take by mouth 2 (two) times daily.    . Probiotic Product (PROBIOTIC DAILY PO) Take by mouth.    . rosuvastatin (CRESTOR) 20 MG tablet Take 1 tablet (20 mg total) by mouth daily. 90 tablet 2  . vitamin E 400 UNIT capsule Take 400 Units by  mouth daily.     No current facility-administered medications for this visit.    No Known Allergies  Family History  Problem Relation Age of Onset  . Arthritis Mother   . Hyperlipidemia Mother   . Hyperlipidemia Father   . Breast cancer Neg Hx     Social History   Socioeconomic History  . Marital status: Married    Spouse name: Not on file  . Number of children: Not on file  . Years of education: Not on file  . Highest education level: Not on file  Occupational History  . Not on file  Tobacco Use  . Smoking status: Never Smoker  . Smokeless tobacco: Never Used  . Tobacco comment: smoked socially as teenager  Vaping Use  . Vaping Use: Never used  Substance and Sexual Activity  . Alcohol use: Yes    Alcohol/week: 6.0 standard drinks    Types: 6 Glasses of wine per week    Comment: moderate  . Drug use: No  . Sexual activity: Yes    Birth control/protection: None  Other Topics Concern  . Not on file  Social History Narrative  . Not on file   Social Determinants of Health   Financial Resource Strain:   . Difficulty of Paying  Living Expenses:   Food Insecurity:   . Worried About Charity fundraiser in the Last Year:   . Arboriculturist in the Last Year:   Transportation Needs:   . Film/video editor (Medical):   Marland Kitchen Lack of Transportation (Non-Medical):   Physical Activity:   . Days of Exercise per Week:   . Minutes of Exercise per Session:   Stress:   . Feeling of Stress :   Social Connections:   . Frequency of Communication with Friends and Family:   . Frequency of Social Gatherings with Friends and Family:   . Attends Religious Services:   . Active Member of Clubs or Organizations:   . Attends Archivist Meetings:   Marland Kitchen Marital Status:   Intimate Partner Violence:   . Fear of Current or Ex-Partner:   . Emotionally Abused:   Marland Kitchen Physically Abused:   . Sexually Abused:      Constitutional: Denies fever, malaise, fatigue, headache or abrupt  weight changes.  HEENT: Pt reports stye of left lower eyelid. Denies eye pain, eye redness, ear pain, ringing in the ears, wax buildup, runny nose, nasal congestion, bloody nose, or sore throat. Respiratory: Pt reports cough. Denies difficulty breathing, shortness of breath, or sputum production.   Cardiovascular: Denies chest pain, chest tightness, palpitations or swelling in the hands or feet.  MSK: Pt reports trigger finger of 3rd and 4th fingers left hand. Neurological: Denies dizziness, difficulty with memory, difficulty with speech or problems with balance and coordination.    No other specific complaints in a complete review of systems (except as listed in HPI above).  Objective:   Physical Exam  BP 128/78   Pulse 75   Temp (!) 97.4 F (36.3 C) (Temporal)   Wt 137 lb (62.1 kg)   SpO2 98%   BMI 28.15 kg/m   Wt Readings from Last 3 Encounters:  09/22/19 134 lb 14.7 oz (61.2 kg)  02/13/19 135 lb (61.2 kg)  08/22/17 141 lb (64 kg)    General: Appears her stated age, well developed, well nourished in NAD. HEENT: Head: normal shape and size; Eyes: sclera white, no icterus, conjunctiva pink, PERRLA and EOMs intact, stye noted to left lower lid;  Neck:  No adenopathy noted. Cardiovascular: Normal rate and rhythm. S1,S2 noted.  No murmur, rubs or gallops noted. Pulmonary/Chest: Normal effort and positive vesicular breath sounds. No respiratory distress. No wheezes, rales or ronchi noted.  MSK: She has nodular area, left palm below the 3rd and 4th fingers left hand. Neurological: Alert and oriented. Psychiatric: Mood and affect normal. Behavior is normal. Judgment and thought content normal.    BMET    Component Value Date/Time   NA 138 02/13/2019 1014   K 4.6 02/13/2019 1014   CL 105 02/13/2019 1014   CO2 23 02/13/2019 1014   GLUCOSE 93 02/13/2019 1014   BUN 24 (H) 02/13/2019 1014   CREATININE 0.74 02/13/2019 1014   CALCIUM 9.9 02/13/2019 1014    Lipid Panel       Component Value Date/Time   CHOL 225 (H) 02/13/2019 1014   TRIG 105.0 02/13/2019 1014   HDL 90.60 02/13/2019 1014   CHOLHDL 2 02/13/2019 1014   VLDL 21.0 02/13/2019 1014   LDLCALC 113 (H) 02/13/2019 1014    CBC    Component Value Date/Time   WBC 6.0 02/13/2019 1014   RBC 3.90 02/13/2019 1014   HGB 12.7 02/13/2019 1014   HCT 37.7 02/13/2019 1014  PLT 194.0 02/13/2019 1014   MCV 96.7 02/13/2019 1014   MCHC 33.8 02/13/2019 1014   RDW 13.7 02/13/2019 1014    Hgb A1C No results found for: HGBA1C        Assessment & Plan:   UC follow-up for Stye, Nasal Congestion and Cough:  UC notes, labs reviewed Referral to ophthalmology per pt request Encouraged warm compresses and lid massage  Trigger Finger, Left Hand:  Advised her to make an appt with Dr. Lorelei Pont for further evaluation  Return precautions discussed  Webb Silversmith, NP This visit occurred during the SARS-CoV-2 public health emergency.  Safety protocols were in place, including screening questions prior to the visit, additional usage of staff PPE, and extensive cleaning of exam room while observing appropriate contact time as indicated for disinfecting solutions.

## 2019-09-29 NOTE — Patient Instructions (Signed)

## 2019-12-10 ENCOUNTER — Other Ambulatory Visit: Payer: Self-pay | Admitting: Internal Medicine

## 2019-12-10 DIAGNOSIS — Z1231 Encounter for screening mammogram for malignant neoplasm of breast: Secondary | ICD-10-CM

## 2020-01-09 ENCOUNTER — Other Ambulatory Visit: Payer: Self-pay

## 2020-01-09 ENCOUNTER — Ambulatory Visit
Admission: RE | Admit: 2020-01-09 | Discharge: 2020-01-09 | Disposition: A | Discharge status: Home / Self Care | Payer: Medicare PPO | Source: Ambulatory Visit | Attending: Internal Medicine | Admitting: Internal Medicine

## 2020-01-09 DIAGNOSIS — Z1231 Encounter for screening mammogram for malignant neoplasm of breast: Secondary | ICD-10-CM

## 2020-01-26 ENCOUNTER — Ambulatory Visit: Payer: Medicare PPO | Admitting: Podiatry

## 2020-01-26 ENCOUNTER — Ambulatory Visit (INDEPENDENT_AMBULATORY_CARE_PROVIDER_SITE_OTHER): Payer: Medicare PPO

## 2020-01-26 ENCOUNTER — Other Ambulatory Visit: Payer: Self-pay | Admitting: Podiatry

## 2020-01-26 ENCOUNTER — Other Ambulatory Visit: Payer: Self-pay

## 2020-01-26 ENCOUNTER — Encounter: Payer: Self-pay | Admitting: Podiatry

## 2020-01-26 DIAGNOSIS — M778 Other enthesopathies, not elsewhere classified: Secondary | ICD-10-CM

## 2020-01-26 DIAGNOSIS — M2011 Hallux valgus (acquired), right foot: Secondary | ICD-10-CM

## 2020-01-26 DIAGNOSIS — M205X1 Other deformities of toe(s) (acquired), right foot: Secondary | ICD-10-CM

## 2020-01-26 NOTE — Progress Notes (Signed)
Subjective:  Patient ID: Vanessa Walton, female    DOB: 1951/02/12,  MRN: [id number] HPI Chief Complaint  Patient presents with  . Foot Pain    2nd/3rd toes right - numbness x 6 months, bunion deformity, right foot is larger than left, tried to adjust shoes and wear a bigger size, tried yoga exercises for toes, massaging-no help  . New Patient (Initial Visit)    69 y.o. female presents with the above complaint.   ROS: Denies fever chills nausea vomiting muscle aches pains calf pain back pain chest pain shortness of breath.  Past Medical History:  Diagnosis Date  . Arthritis    right index finger  . GERD (gastroesophageal reflux disease)   . Hyperlipidemia   . Meniere's disease    Past Surgical History:  Procedure Laterality Date  . CESAREAN SECTION    . COLONOSCOPY WITH PROPOFOL N/A 08/22/2017   Procedure: COLONOSCOPY WITH PROPOFOL;  Surgeon: Vanessa Landsman, MD;  Location: [place];  Service: Endoscopy;  Laterality: N/A;  . POLYPECTOMY  08/22/2017   Procedure: POLYPECTOMY;  Surgeon: Vanessa Landsman, MD;  Location: [place];  Service: Endoscopy;;  . TONSILLECTOMY      Current Outpatient Medications:  .  B Complex Vitamins (B COMPLEX PO), Take by mouth daily., Disp: , Rfl:  .  Biotin 10000 MCG TABS, Take by mouth., Disp: , Rfl:  .  Cholecalciferol (VITAMIN D3) 50 MCG (2000 UT) TABS, Take by mouth., Disp: , Rfl:  .  erythromycin ophthalmic ointment, Place a 1/2 inch ribbon of ointment into the lower eyelid., Disp: 3.5 g, Rfl: 0 .  GLUCOSAMINE CHONDROITIN COMPLX PO, Take by mouth daily., Disp: , Rfl:  .  meloxicam (MOBIC) 15 MG tablet, , Disp: , Rfl:  .  Omega-3 Fatty Acids (FISH OIL DOUBLE STRENGTH) 1200 MG CAPS, Take by mouth 2 (two) times daily., Disp: , Rfl:  .  Probiotic Product (PROBIOTIC DAILY PO), Take by mouth., Disp: , Rfl:  .  rosuvastatin (CRESTOR) 20 MG tablet, Take 1 tablet (20 mg total) by mouth daily., Disp: 90 tablet, Rfl: 2 .   vitamin E 400 UNIT capsule, Take 400 Units by mouth daily., Disp: , Rfl:  .  XIIDRA 5 % SOLN, , Disp: , Rfl:   No Known Allergies Review of Systems Objective:  There were no vitals filed for this visit.  General: Well developed, nourished, in no acute distress, alert and oriented x3   Dermatological: Skin is warm, dry and supple bilateral. Nails x 10 are well maintained; remaining integument appears unremarkable at this time. There are no open sores, no preulcerative lesions, no rash or signs of infection present.  Vascular: Dorsalis Pedis artery and Posterior Tibial artery pedal pulses are 2/4 bilateral with immedate capillary fill time. Pedal hair growth present. No varicosities and no lower extremity edema present bilateral.   Neruologic: Grossly intact via light touch bilateral. Vibratory intact via tuning fork bilateral. Protective threshold with Semmes Wienstein monofilament intact to all pedal sites bilateral. Patellar and Achilles deep tendon reflexes 2+ bilateral. No Babinski or clonus noted bilateral.   Musculoskeletal: No gross boney pedal deformities bilateral. No pain, crepitus, or limitation noted with foot and ankle range of motion bilateral. Muscular strength 5/5 in all groups tested bilateral.  She has pain on range of motion of the first metatarsophalangeal joint and limited range of motion to about 15 to 20 degrees.  Hypermobile hallux interphalangeal joint.  She has pain on end range of motion  of the second and third metatarsophalangeal joints.  Increase in the first intermetatarsal angle greater than normal value with an elevated first metatarsal may be responsible for the pain in the second and third.  Gait: Unassisted, Nonantalgic.    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  Assessment & Plan:   Assessment: Capsulitis  hallux limitus first metatarsophalangeal joint.  Plan: Discussed etiology pathology conservative surgical therapies discussed appropriate shoe gear stretching exercise ice therapy and shoe gear modification she will follow up with [place] on as-needed basis.     Vanessa Walton, [place]

## 2020-01-28 ENCOUNTER — Other Ambulatory Visit: Payer: Self-pay | Admitting: Internal Medicine

## 2020-01-28 NOTE — Telephone Encounter (Signed)
This was last filled 02/2019... I will let pt know thst she needs to schedule CPE due after Dec 11th... please advise if okay to refill in the meantime

## 2020-02-18 ENCOUNTER — Encounter: Payer: Self-pay | Admitting: Family Medicine

## 2020-02-18 ENCOUNTER — Other Ambulatory Visit (HOSPITAL_COMMUNITY)
Admission: RE | Admit: 2020-02-18 | Discharge: 2020-02-18 | Disposition: A | Discharge status: Home / Self Care | Payer: Medicare PPO | Source: Ambulatory Visit | Attending: Family Medicine | Admitting: Family Medicine

## 2020-02-18 ENCOUNTER — Ambulatory Visit (INDEPENDENT_AMBULATORY_CARE_PROVIDER_SITE_OTHER): Payer: Medicare PPO | Admitting: Family Medicine

## 2020-02-18 ENCOUNTER — Other Ambulatory Visit: Payer: Self-pay

## 2020-02-18 VITALS — BP 132/81 | HR 68 | Ht 59.0 in | Wt 136.0 lb

## 2020-02-18 DIAGNOSIS — Z1151 Encounter for screening for human papillomavirus (HPV): Secondary | ICD-10-CM | POA: Diagnosis not present

## 2020-02-18 DIAGNOSIS — Z01419 Encounter for gynecological examination (general) (routine) without abnormal findings: Secondary | ICD-10-CM | POA: Diagnosis present

## 2020-02-18 DIAGNOSIS — A6000 Herpesviral infection of urogenital system, unspecified: Secondary | ICD-10-CM

## 2020-02-18 MED ORDER — VALACYCLOVIR HCL 1 G PO TABS: 2000.0000 mg | ORAL_TABLET | Freq: Two times a day (BID) | ORAL | 2 refills | Status: DC

## 2020-02-18 MED ORDER — VALACYCLOVIR HCL 1 G PO TABS: 1000.0000 mg | ORAL_TABLET | Freq: Every day | ORAL | 2 refills | Status: DC

## 2020-02-18 NOTE — Progress Notes (Signed)
   GYNECOLOGY ANNUAL PREVENTATIVE CARE ENCOUNTER NOTE  Subjective:   Vanessa Walton is a 69 y.o. G60P2002 female here for a routine annual gynecologic exam.  Current complaints: none.   Denies abnormal vaginal bleeding, discharge, pelvic pain, problems with intercourse or other gynecologic concerns.    Gynecologic History No LMP recorded. Patient is postmenopausal. Contraception: post menopausal status Last Pap: 2016. Results were: normal Last mammogram: 01/2020. Results were: normal  The following portions of the patient's history were reviewed and updated as appropriate: allergies, current medications, past family history, past medical history, past social history, past surgical history and problem list.  Review of Systems Pertinent items are noted in HPI.   Objective:  BP 132/81   Pulse 68   Ht 4\' 11"  (1.499 m)   Wt 136 lb (61.7 kg)   BMI 27.47 kg/m  CONSTITUTIONAL: Well-developed, well-nourished female in no acute distress.  HENT:  Normocephalic, atraumatic, External right and left ear normal. EYES:  No scleral icterus.  NECK: Normal range of motion, supple, no masses.  Normal thyroid.  SKIN: Skin is warm and dry. No rash noted. Not diaphoretic. No erythema. No pallor. Multiple hyperpigmented lesions throughout torso and body.  NEUROLOGIC: Alert and oriented to person, place, and time. Normal muscle tone. No gross cranial nerve deficit noted. PSYCHIATRIC: Normal mood and affect. Normal behavior.  CARDIOVASCULAR: Normal heart rate noted, regular rhythm. 2+ distal pulses. RESPIRATORY: Effort and breath sounds normal, no problems with respiration noted. BREASTS: Symmetric in size. No masses, skin changes, nipple drainage, or lymphadenopathy. ABDOMEN: Soft,  no distention noted.  No tenderness, rebound or guarding.  PELVIC: Normal appearing external genitalia; normal appearing vaginal mucosa and cervix.  No abnormal discharge noted. Pap smear obtained.  Normal uterine size, no other  palpable masses, no uterine or adnexal tenderness. MUSCULOSKELETAL: Normal range of motion.    Assessment and Plan:  1) Annual gynecologic examination with pap smear:  Will follow up results of pap smear and manage accordingly.   Routine preventative health maintenance measures emphasized.  1. Well woman exam with routine gynecological exam Had mammo in 01/2020 UTD on colonoscopy and other HM Had COVID booster, updated immunizations Reviewed importance of skin care/sun care. Has derm appt upcoming Reviewed having pap today and then likely stopping now that is over 65 Applauded her health maintenance by her and PCP. She is up to date on all routine measures. Discussed and counseled on any vaginal bleeding being abnormal and seeking care.   2. Genital herpes simplex, unspecified site Reviewed hx and patient has few flares and uses partners medications. rx sent for intermittent flare treatment - valACYclovir (VALTREX) 1000 MG tablet; Take 1 tablet (1,000 mg total) by mouth daily. For 5 days. May repeat if needed  Dispense: 30 tablet; Refill: 2  Please refer to After Visit Summary for other counseling recommendations.   Return in about 1 year (around 02/17/2021), or GYN concerns.  [name], MD, MPH, ABFM Attending Physician Center for [facility]

## 2020-02-18 NOTE — Progress Notes (Signed)
Would like RX for valtrex

## 2020-02-20 LAB — CYTOLOGY - PAP
Comment: NEGATIVE
Diagnosis: NEGATIVE
High risk HPV: NEGATIVE

## 2020-02-24 ENCOUNTER — Ambulatory Visit: Payer: Medicare PPO | Admitting: Dermatology

## 2020-02-24 ENCOUNTER — Other Ambulatory Visit: Payer: Self-pay

## 2020-02-24 DIAGNOSIS — L821 Other seborrheic keratosis: Secondary | ICD-10-CM | POA: Diagnosis not present

## 2020-02-24 DIAGNOSIS — C44311 Basal cell carcinoma of skin of nose: Secondary | ICD-10-CM

## 2020-02-24 DIAGNOSIS — L719 Rosacea, unspecified: Secondary | ICD-10-CM | POA: Diagnosis not present

## 2020-02-24 DIAGNOSIS — D492 Neoplasm of unspecified behavior of bone, soft tissue, and skin: Secondary | ICD-10-CM

## 2020-02-24 DIAGNOSIS — C4491 Basal cell carcinoma of skin, unspecified: Secondary | ICD-10-CM

## 2020-02-24 HISTORY — DX: Basal cell carcinoma of skin, unspecified: C44.91

## 2020-02-24 MED ORDER — DOXYCYCLINE HYCLATE 20 MG PO TABS: ORAL_TABLET | ORAL | 5 refills | Status: DC

## 2020-02-24 MED ORDER — IVERMECTIN 1 % EX CREA: TOPICAL_CREAM | CUTANEOUS | 2 refills | Status: DC

## 2020-02-24 NOTE — Patient Instructions (Addendum)
Seborrheic Keratosis  What causes seborrheic keratoses? Seborrheic keratoses are harmless, common skin growths that first appear during adult life.  As time goes by, more growths appear.  Some people may develop a large number of them.  Seborrheic keratoses appear on both covered and uncovered body parts.  They are not caused by sunlight.  The tendency to develop seborrheic keratoses can be inherited.  They vary in color from skin-colored to gray, brown, or even black.  They can be either smooth or have a rough, warty surface.   Seborrheic keratoses are superficial and look as if they were stuck on the skin.  Under the microscope this type of keratosis looks like layers upon layers of skin.  That is why at times the top layer may seem to fall off, but the rest of the growth remains and re-grows.    Treatment Seborrheic keratoses do not need to be treated, but can easily be removed in the office.  Seborrheic keratoses often cause symptoms when they rub on clothing or jewelry.  Lesions can be in the way of shaving.  If they become inflamed, they can cause itching, soreness, or burning.  Removal of a seborrheic keratosis can be accomplished by freezing, burning, or surgery. If any spot bleeds, scabs, or grows rapidly, please return to have it checked, as these can be an indication of a skin cancer.  Cryotherapy Aftercare  . Wash gently with soap and water everyday.   Marland Kitchen Apply Vaseline and Band-Aid daily until healed.  Rosacea  What is rosacea? Rosacea (say: ro-zay-sha) is a common skin disease that usually begins as a trend of flushing or blushing easily.  As rosacea progresses, a persistent redness in the center of the face will develop and may gradually spread beyond the nose and cheeks to the forehead and chin.  In some cases, the ears, chest, and back could be affected.  Rosacea may appear as tiny blood vessels or small red bumps that occur in crops.  Frequently they can contain pus, and are called  "pustules".  If the bumps do not contain pus, they are referred to as "papules".  Rarely, in prolonged, untreated cases of rosacea, the oil glands of the nose and cheeks may become permanently enlarged.  This is called rhinophyma, and is seen more frequently in men.  Signs and Risks In its beginning stages, rosacea tends to come and go, which makes it difficult to recognize.  It can start as intermittent flushing of the face.  Eventually, blood vessels may become permanently visible.  Pustules and papules can appear, but can be mistaken for adult acne.  People of all races, ages, genders and ethnic groups are at risk of developing rosacea.  However, it is more common in women (especially around menopause) and adults with fair skin between the ages of 29 and 6.  Treatment Dermatologists typically recommend a combination of treatments to effectively manage rosacea.  Treatment can improve symptoms and may stop the progression of the rosacea.  Treatment may involve both topical and oral medications.  The tetracycline antibiotics are often used for their anti-inflammatory effect; however, because of the possibility of developing antibiotic resistance, they should not be used long term at full dose.  For dilated blood vessels the options include electrodessication (uses electric current through a small needle), laser treatment, and cosmetics to hide the redness.   With all forms of treatment, improvement is a slow process, and patients may not see any results for the first 3-4  weeks.  It is very important to avoid the sun and other triggers.  Patients must wear sunscreen daily.  Skin Care Instructions: 1. Cleanse the skin with a mild soap such as CeraVe cleanser, Cetaphil cleanser, or Dove soap once or twice daily as needed. 2. Moisturize with Eucerin Redness Relief Daily Perfecting Lotion (has a subtle green tint), CeraVe Moisturizing Cream, or Oil of Olay Daily Moisturizer with sunscreen every morning and/or  night as recommended. 3. Makeup should be "non-comedogenic" (won't clog pores) and be labeled "for sensitive skin". Good choices for cosmetics are: Neutrogena, Almay, and Physician's Formula.  Any product with a green tint tends to offset a red complexion. 4. If your eyes are dry and irritated, use artificial tears 2-3 times per day and cleanse the eyelids daily with baby shampoo.  Have your eyes examined at least every 2 years.  Be sure to tell your eye doctor that you have rosacea. 5. Alcoholic beverages tend to cause flushing of the skin, and may make rosacea worse. 6. Always wear sunscreen, protect your skin from extreme hot and cold temperatures, and avoid spicy foods, hot drinks, and mechanical irritation such as rubbing, scrubbing, or massaging the face.  Avoid harsh skin cleansers, cleansing masks, astringents, and exfoliation. If a particular product burns or makes your face feel tight, then it is likely to flare your rosacea. 7. If you are having difficulty finding a sunscreen that you can tolerate, you may try switching to a chemical-free sunscreen.  These are ones whose active ingredient is zinc oxide or titanium dioxide only.  They should also be fragrance free, non-comedogenic, and labeled for sensitive skin. 8. Rosacea triggers may vary from person to person.  There are a variety of foods that have been reported to trigger rosacea.  Some patients find that keeping a diary of what they were doing when they flared helps them avoid triggers.  Doxycycline should be taken with food to prevent nausea. Do not lay down for 30 minutes after taking. Be cautious with sun exposure and use good sun protection while on this medication. Pregnant women should not take this medication.   Recommend [employer] around eyes.   Wound Care Instructions  1. Cleanse wound gently with soap and water once a day then pat dry with clean gauze. Apply a thing coat of Petrolatum (petroleum jelly, "Vaseline")  over the wound (unless you have an allergy to this). We recommend that you use a new, sterile tube of Vaseline. Do not pick or remove scabs. Do not remove the yellow or white "healing tissue" from the base of the wound.  2. Cover the wound with fresh, clean, nonstick gauze and secure with paper tape. You may use Band-Aids in place of gauze and tape if the would is small enough, but would recommend trimming much of the tape off as there is often too much. Sometimes Band-Aids can irritate the skin.  3. You should call the office for your biopsy report after 1 week if you have not already been contacted.  4. If you experience any problems, such as abnormal amounts of bleeding, swelling, significant bruising, significant pain, or evidence of infection, please call the office immediately.

## 2020-02-24 NOTE — Progress Notes (Signed)
Follow-Up Visit   Subjective  Vanessa Walton is a 69 y.o. female who presents for the following: Skin Problem (Patient has a mole at base of left neck that she would like checked. Patient not sure how long it's been there or if it has changed. She also has a spot at nose that has been there for a while. It has gotten irritated and sometimes bleeds. Patient has been under the care of her eye dr for severely dry eyes, demodex mites and blepharitis and would like the opinion of a dermatologist. Sweating seems to trigger the break outs and she does have photos. Patient would also like a rx for skin lightener to use ).   The following portions of the chart were reviewed this encounter and updated as appropriate:       Review of Systems:  No other skin or systemic complaints except as noted in HPI or Assessment and Plan.  Objective  Well appearing patient in no apparent distress; mood and affect are within normal limits.  A focused examination was performed including face, neck. Relevant physical exam findings are noted in the Assessment and Plan.  Objective  left mid cheek: Stuck-on, waxy, tan-brown papules and macules-- Discussed benign etiology and prognosis.   Images    Objective  Face: Patient photos reviewed showing inflammatory papules at upper eyelids and nose. She is clear today.  She c/o dry, irritated eyes and eyelids  Objective  Left nasal dorsum: 24mm blanching pink macule        Assessment & Plan  Seborrheic keratosis left mid cheek  Vs Lentigo, LN2 x 1 today  Discussed cosmetic procedure (cryotherapy), noncovered.  $60 for 1st lesion and $15 for each additional lesion if done on the same day.  Maximum charge $350.  One touch-up treatment included no charge. Discussed risks of treatment including dyspigmentation, small scar, and/or recurrence.   Destruction of lesion - left mid cheek  Destruction method: cryotherapy   Informed consent: discussed and consent  obtained   Lesion destroyed using liquid nitrogen: Yes   Region frozen until ice ball extended beyond lesion: Yes   Outcome: patient tolerated procedure well with no complications   Post-procedure details: wound care instructions given   Additional details:  Prior to procedure, discussed risks of blister formation, small wound, skin dyspigmentation, or rare scar following cryotherapy.     Rosacea Face  Start doxycycline 20mg  with food 2 po qd  Doxycycline should be taken with food to prevent nausea. Do not lay down for 30 minutes after taking. Be cautious with sun exposure and use good sun protection while on this medication. Pregnant women should not take this medication.   Start Soolantra Cream nightly. If not covered, can send in Skin Medicinals Triple Cream for rosacea.   May also use [place] baby wash at eyes or other opthomologist rec. eyelid wash.   Rosacea is a chronic progressive skin condition usually affecting the face of adults, causing redness and/or acne bumps. It is treatable but not curable. It sometimes affects the eyes (ocular rosacea) as well. It may respond to topical and/or systemic medication and can flare with stress, sun exposure, alcohol, exercise and some foods.  Daily application of broad spectrum spf 30+ sunscreen to face is recommended to reduce flares. Wear UV protective sunglasses to protect eyes  Ordered Medications: doxycycline (PERIOSTAT) 20 MG tablet Ivermectin (SOOLANTRA) 1 % CREA  Neoplasm of skin Left nasal dorsum  Epidermal / dermal shaving  Lesion  diameter (cm):  0.4 Informed consent: discussed and consent obtained   Patient was prepped and draped in usual sterile fashion: Area prepped with alcohol. Anesthesia: the lesion was anesthetized in a standard fashion   Anesthetic:  1% lidocaine w/ epinephrine 1-100,000 buffered w/ 8.4% NaHCO3 Instrument used: flexible razor blade   Hemostasis achieved with: pressure, aluminum chloride  and electrodesiccation   Outcome: patient tolerated procedure well   Post-procedure details: wound care instructions given   Post-procedure details comment:  Ointment and small bandage applied.   Specimen 1 - Surgical pathology Differential Diagnosis: telangiectasia r/o BCC  Check Margins: No 75mm blanching pink macule   Seborrheic Keratoses - Stuck-on, waxy, tan-brown papules and plaques  - Discussed benign etiology and prognosis. - Observe - Call for any changes  Return in about 3 months (around 05/24/2020) for Rosacea, recheck cosmetic SK.  Graciella Belton, RMA, am acting as scribe for [name], MD . Documentation: I have reviewed the above documentation for accuracy and completeness, and I agree with the above.  [name] MD

## 2020-03-01 ENCOUNTER — Encounter: Payer: Self-pay | Admitting: Internal Medicine

## 2020-03-10 ENCOUNTER — Telehealth: Payer: Self-pay

## 2020-03-10 DIAGNOSIS — C44310 Basal cell carcinoma of skin of unspecified parts of face: Secondary | ICD-10-CM

## 2020-03-10 NOTE — Telephone Encounter (Signed)
Advised pt of bx results and sent referral to [facility].  Pt was advised the [facility] would call to make her appointment./sh

## 2020-03-10 NOTE — Telephone Encounter (Signed)
-----   Message from Willeen Niece, MD sent at 03/09/2020 10:56 AM EST ----- Skin , left nasal dorsum BASAL CELL CARCINOMA, NODULAR PATTERN  BCC skin cancer Needs Mohs surgery, [place] or [facility]

## 2020-05-20 ENCOUNTER — Encounter: Payer: Self-pay | Admitting: Internal Medicine

## 2020-05-20 ENCOUNTER — Other Ambulatory Visit: Payer: Self-pay | Admitting: Internal Medicine

## 2020-05-23 ENCOUNTER — Other Ambulatory Visit: Payer: Self-pay | Admitting: Family Medicine

## 2020-05-23 DIAGNOSIS — A6000 Herpesviral infection of urogenital system, unspecified: Secondary | ICD-10-CM

## 2020-05-25 ENCOUNTER — Ambulatory Visit: Payer: Medicare PPO | Admitting: Dermatology

## 2020-05-28 ENCOUNTER — Ambulatory Visit: Payer: Medicare PPO | Admitting: Internal Medicine

## 2020-05-28 ENCOUNTER — Other Ambulatory Visit: Payer: Self-pay

## 2020-05-28 VITALS — BP 136/74 | HR 76 | Temp 98.0°F | Wt 130.0 lb

## 2020-05-28 DIAGNOSIS — H8111 Benign paroxysmal vertigo, right ear: Secondary | ICD-10-CM

## 2020-05-28 DIAGNOSIS — E7800 Pure hypercholesterolemia, unspecified: Secondary | ICD-10-CM | POA: Diagnosis not present

## 2020-05-28 DIAGNOSIS — H903 Sensorineural hearing loss, bilateral: Secondary | ICD-10-CM

## 2020-05-28 DIAGNOSIS — H8101 Meniere's disease, right ear: Secondary | ICD-10-CM | POA: Diagnosis not present

## 2020-05-28 NOTE — Progress Notes (Signed)
Subjective:    Patient ID: Vanessa Walton, female    DOB: 1950-06-15, 70 y.o.   MRN: [id number]  HPI  Pt presents to the clinic today with c/o vertigo. She reports this started 1 week ago. She describes the dizziness as a sensation that the room is spinning mainly when she turns her head to the right.  She reports some associated runny nose, hearing loss on the right, nausea and vomiting.  She denies visual changes, or near syncope. 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 She has tried Claritin, Dramamine and Epley maneuver with some relief of symptoms. She has seen ENT in the past for the same. ` She would also like her cholesterol checked today. She is taking Rosuvastatin as prescribed.   Review of Systems      Past Medical History:  Diagnosis Date  . Arthritis    right index finger  . Basal cell carcinoma 02/24/2020   L nasal dorsum, [facility] 04/26/20  . GERD (gastroesophageal reflux disease)   . Herpes simplex virus (HSV) infection   . Hyperlipidemia   . Meniere's disease     Current Outpatient Medications  Medication Sig Dispense Refill  . B Complex Vitamins (B COMPLEX PO) Take by mouth daily.    . Biotin 10000 MCG TABS Take by mouth.    . Cholecalciferol (VITAMIN D3) 50 MCG (2000 UT) TABS Take by mouth.    . doxycycline (PERIOSTAT) 20 MG tablet Take 2 by mouth with food daily 60 tablet 5  . GLUCOSAMINE CHONDROITIN COMPLX PO Take by mouth daily.    . Ivermectin (SOOLANTRA) 1 % CREA Apply to face nightly. 45 g 2  . meloxicam (MOBIC) 15 MG tablet TAKE 1 TABLET BY MOUTH EVERY DAY 30 tablet 1  . Omega-3 Fatty Acids (FISH OIL DOUBLE STRENGTH) 1200 MG CAPS Take by mouth 2 (two) times daily.    . Probiotic Product (PROBIOTIC DAILY PO) Take by mouth.    . rosuvastatin (CRESTOR) 20 MG tablet Take 1 tablet (20 mg total) by mouth daily. 90 tablet 2  . valACYclovir (VALTREX) 1000 MG tablet Take 1 tablet (1,000 mg total) by mouth  daily. For 5 days. May repeat if needed 30 tablet 2  . vitamin E 400 UNIT capsule Take 400 Units by mouth daily.    Marland Kitchen XIIDRA 5 % SOLN      No current facility-administered medications for this visit.    No Known Allergies  Family History  Problem Relation Age of Onset  . Arthritis Mother   . Hyperlipidemia Mother   . Hyperlipidemia Father   . Breast cancer Neg Hx     Social History   Socioeconomic History  . Marital status: Married    Spouse name: Not on file  . Number of children: Not on file  . Years of education: Not on file  . Highest education level: Not on file  Occupational History  . Not on file  Tobacco Use  . Smoking status: Never Smoker  . Smokeless tobacco: Never Used  . Tobacco comment: smoked socially as teenager  Vaping Use  . Vaping Use: Never used  Substance and Sexual Activity  . Alcohol use: Yes    Alcohol/week: 6.0 standard drinks    Types: 6 Glasses of wine per week    Comment: moderate  . Drug use: No  . Sexual activity: Yes    Birth control/protection: None  Other Topics Concern  .  Not on file  Social History Narrative  . Not on file   Social Determinants of Health   Financial Resource Strain: Not on file  Food Insecurity: Not on file  Transportation Needs: Not on file  Physical Activity: Not on file  Stress: Not on file  Social Connections: Not on file  Intimate Partner Violence: Not on file     Constitutional: Denies fever, malaise, fatigue, headache or abrupt weight changes.  HEENT: Pt reports runny nose, hearing loss of right ear. Denies eye pain, eye redness, ear pain, ringing in the ears, wax buildup, nasal congestion, bloody nose, or sore throat. Respiratory: Denies difficulty breathing, shortness of breath, cough or sputum production.   Cardiovascular: Denies chest pain, chest tightness, palpitations or swelling in the hands or feet.  Gastrointestinal: Pt reports nausea and vomiting has resolved. Denies abdominal pain,  bloating, constipation, diarrhea or blood in the stool.  Neurological: Pt reports dizziness. Denies difficulty with memory, difficulty with speech or problems with balance and coordination.    No other specific complaints in a complete review of systems (except as listed in HPI above).  Objective:   Physical Exam   BP 136/74   Pulse 76   Temp 98 F (36.7 C) (Temporal)   Wt 59 kg   SpO2 97%   BMI 26.26 kg/m   Wt Readings from Last 3 Encounters:  02/18/20 136 lb (61.7 kg)  09/29/19 137 lb (62.1 kg)  09/22/19 134 lb 14.7 oz (61.2 kg)    General: Appears her stated age, well developed, well nourished in NAD. HEENT: Head: normal shape and size; Eyes: sclera white, EOMs intact; Ears: Tm's gray and intact, normal light reflex;  Neck:  Neck supple, trachea midline. No masses, lumps or thyromegaly present.  Cardiovascular: Normal rate and rhythm.  Pulmonary/Chest: Normal effort and positive vesicular breath sounds.  Musculoskeletal: No difficulty with gait.  Neurological: Alert and oriented.    BMET    Component Value Date/Time   NA 138 02/13/2019 1014   K 4.6 02/13/2019 1014   CL 105 02/13/2019 1014   CO2 23 02/13/2019 1014   GLUCOSE 93 02/13/2019 1014   BUN 24 (H) 02/13/2019 1014   CREATININE 0.74 02/13/2019 1014   CALCIUM 9.9 02/13/2019 1014    Lipid Panel     Component Value Date/Time   CHOL 225 (H) 02/13/2019 1014   TRIG 105.0 02/13/2019 1014   HDL 90.60 02/13/2019 1014   CHOLHDL 2 02/13/2019 1014   VLDL 21.0 02/13/2019 1014   LDLCALC 113 (H) 02/13/2019 1014    CBC    Component Value Date/Time   WBC 6.0 02/13/2019 1014   RBC 3.90 02/13/2019 1014   HGB 12.7 02/13/2019 1014   HCT 37.7 02/13/2019 1014   PLT 194.0 02/13/2019 1014   MCV 96.7 02/13/2019 1014   MCHC 33.8 02/13/2019 1014   RDW 13.7 02/13/2019 1014    Hgb A1C No results found for: HGBA1C        Assessment & Plan:    Webb Silversmith, NP This visit occurred during the SARS-CoV-2 public  health emergency.  Safety protocols were in place, including screening questions prior to the visit, additional usage of staff PPE, and extensive cleaning of exam room while observing appropriate contact time as indicated for disinfecting solutions.

## 2020-05-29 LAB — COMPREHENSIVE METABOLIC PANEL
AG Ratio: 1.9 (calc) (ref 1.0–2.5)
ALT: 35 U/L — ABNORMAL HIGH (ref 6–29)
AST: 40 U/L — ABNORMAL HIGH (ref 10–35)
Albumin: 4.5 g/dL (ref 3.6–5.1)
Alkaline phosphatase (APISO): 35 U/L — ABNORMAL LOW (ref 37–153)
BUN: 19 mg/dL (ref 7–25)
CO2: 21 mmol/L (ref 20–32)
Calcium: 9.6 mg/dL (ref 8.6–10.4)
Chloride: 102 mmol/L (ref 98–110)
Creat: 0.75 mg/dL (ref 0.50–0.99)
Globulin: 2.4 g/dL (calc) (ref 1.9–3.7)
Glucose, Bld: 76 mg/dL (ref 65–99)
Potassium: 4.5 mmol/L (ref 3.5–5.3)
Sodium: 138 mmol/L (ref 135–146)
Total Bilirubin: 0.4 mg/dL (ref 0.2–1.2)
Total Protein: 6.9 g/dL (ref 6.1–8.1)

## 2020-05-29 LAB — CBC
HCT: 36.6 % (ref 35.0–45.0)
Hemoglobin: 12.3 g/dL (ref 11.7–15.5)
MCH: 32.1 pg (ref 27.0–33.0)
MCHC: 33.6 g/dL (ref 32.0–36.0)
MCV: 95.6 fL (ref 80.0–100.0)
MPV: 11 fL (ref 7.5–12.5)
Platelets: 193 10*3/uL (ref 140–400)
RBC: 3.83 10*6/uL (ref 3.80–5.10)
RDW: 13 % (ref 11.0–15.0)
WBC: 6.7 10*3/uL (ref 3.8–10.8)

## 2020-05-29 LAB — LIPID PANEL
Cholesterol: 198 mg/dL (ref <200)
HDL: 106 mg/dL (ref >=50)
LDL Cholesterol (Calc): 74 mg/dL (calc)
Non-HDL Cholesterol (Calc): 92 mg/dL (calc) (ref <130)
Total CHOL/HDL Ratio: 1.9 (calc) (ref <5.0)
Triglycerides: 101 mg/dL (ref <150)

## 2020-05-29 LAB — TSH: TSH: 1.5 mIU/L (ref 0.40–4.50)

## 2020-05-30 ENCOUNTER — Encounter: Payer: Self-pay | Admitting: Internal Medicine

## 2020-05-30 DIAGNOSIS — H8111 Benign paroxysmal vertigo, right ear: Secondary | ICD-10-CM | POA: Insufficient documentation

## 2020-05-30 NOTE — Assessment & Plan Note (Signed)
CMET and lipid profile today Encouraged her to consume a low fat diet Continue Rosuvastatin 

## 2020-05-30 NOTE — Assessment & Plan Note (Signed)
Consider referral back to ENT Dr. Pryor Ochoa for further evaluation

## 2020-05-30 NOTE — Assessment & Plan Note (Signed)
Start Flonase OTC Continue Claritin Will check CBC, CMET and TSH today Consider referral back to ENT Dr. Pryor Ochoa for further evaluation

## 2020-05-30 NOTE — Patient Instructions (Signed)
Benign Positional Vertigo Vertigo is the feeling that you or your surroundings are moving when they are not. Benign positional vertigo is the most common form of vertigo. This is usually a harmless condition (benign). This condition is positional. This means that symptoms are triggered by certain movements and positions. This condition can be dangerous if it occurs while you are doing something that could cause harm to you or others. This includes activities such as driving or operating machinery. What are the causes? The inner ear has fluid-filled canals that help your brain sense movement and balance. When the fluid moves, the brain receives messages about your body's position. With benign positional vertigo, crystals in the inner ear break free and disturb the inner ear area. This causes your brain to receive confusing messages about your body's position. What increases the risk? You are more likely to develop this condition if:  You are a woman.  You are 50 years of age or older.  You have recently had a head injury.  You have an inner ear disease. What are the signs or symptoms? Symptoms of this condition usually happen when you move your head or your eyes in different directions. Symptoms may start suddenly, and usually last for less than a minute. They include:  Loss of balance and falling.  Feeling like you are spinning or moving.  Feeling like your surroundings are spinning or moving.  Nausea and vomiting.  Blurred vision.  Dizziness.  Involuntary eye movement (nystagmus). Symptoms can be mild and cause only minor problems, or they can be severe and interfere with daily life. Episodes of benign positional vertigo may return (recur) over time. Symptoms may improve over time. How is this diagnosed? This condition may be diagnosed based on:  Your medical history.  Physical exam of the head, neck, and ears.  Positional tests to check for or stimulate vertigo. You may be  asked to turn your head and change positions, such as going from sitting to lying down. A health care provider will watch for symptoms of vertigo. You may be referred to a health care provider who specializes in ear, nose, and throat problems (ENT, or otolaryngologist) or a provider who specializes in disorders of the nervous system (neurologist). How is this treated? This condition may be treated in a session in which your health care provider moves your head in specific positions to help the displaced crystals in your inner ear move. Treatment for this condition may take several sessions. Surgery may be needed in severe cases, but this is rare. In some cases, benign positional vertigo may resolve on its own in 2-4 weeks.   Follow these instructions at home: Safety  Move slowly. Avoid sudden body or head movements or certain positions, as told by your health care provider.  Avoid driving until your health care provider says it is safe for you to do so.  Avoid operating heavy machinery until your health care provider says it is safe for you to do so.  Avoid doing any tasks that would be dangerous to you or others if vertigo occurs.  If you have trouble walking or keeping your balance, try using a cane for stability. If you feel dizzy or unstable, sit down right away.  Return to your normal activities as told by your health care provider. Ask your health care provider what activities are safe for you. General instructions  Take over-the-counter and prescription medicines only as told by your health care provider.  Drink enough fluid   to keep your urine pale yellow.  Keep all follow-up visits as told by your health care provider. This is important. Contact a health care provider if:  You have a fever.  Your condition gets worse or you develop new symptoms.  Your family or friends notice any behavioral changes.  You have nausea or vomiting that gets worse.  You have numbness or a  prickling and tingling sensation. Get help right away if you:  Have difficulty speaking or moving.  Are always dizzy.  Faint.  Develop severe headaches.  Have weakness in your legs or arms.  Have changes in your hearing or vision.  Develop a stiff neck.  Develop sensitivity to light. Summary  Vertigo is the feeling that you or your surroundings are moving when they are not. Benign positional vertigo is the most common form of vertigo.  This condition is caused by crystals in the inner ear that become displaced. This causes a disturbance in an area of the inner ear that helps your brain sense movement and balance.  Symptoms include loss of balance and falling, feeling that you or your surroundings are moving, nausea and vomiting, and blurred vision.  This condition can be diagnosed based on symptoms, a physical exam, and positional tests.  Follow safety instructions as told by your health care provider. You will also be told when to contact your health care provider in case of problems. This information is not intended to replace advice given to you by your health care provider. Make sure you discuss any questions you have with your health care provider. Document Revised: 01/14/2019 Document Reviewed: 08/01/2017 Elsevier Patient Education  2021 [employer]  

## 2020-06-01 MED ORDER — ROSUVASTATIN CALCIUM 10 MG PO TABS: 10.0000 mg | ORAL_TABLET | Freq: Every day | ORAL | 0 refills | Status: DC

## 2020-06-07 ENCOUNTER — Other Ambulatory Visit: Payer: Self-pay

## 2020-06-07 ENCOUNTER — Ambulatory Visit: Payer: Medicare PPO | Admitting: Dermatology

## 2020-06-07 ENCOUNTER — Encounter: Payer: Self-pay | Admitting: Dermatology

## 2020-06-07 DIAGNOSIS — L219 Seborrheic dermatitis, unspecified: Secondary | ICD-10-CM | POA: Diagnosis not present

## 2020-06-07 DIAGNOSIS — Z85828 Personal history of other malignant neoplasm of skin: Secondary | ICD-10-CM | POA: Diagnosis not present

## 2020-06-07 DIAGNOSIS — L820 Inflamed seborrheic keratosis: Secondary | ICD-10-CM | POA: Diagnosis not present

## 2020-06-07 DIAGNOSIS — L719 Rosacea, unspecified: Secondary | ICD-10-CM

## 2020-06-07 MED ORDER — DOXYCYCLINE HYCLATE 20 MG PO TABS: ORAL_TABLET | ORAL | 5 refills | Status: DC

## 2020-06-07 MED ORDER — KETOCONAZOLE 2 % EX CREA: 1.0000 "application " | TOPICAL_CREAM | Freq: Every day | CUTANEOUS | 4 refills | Status: DC

## 2020-06-07 NOTE — Progress Notes (Signed)
Follow-Up Visit   Subjective  Vanessa Walton is a 70 y.o. female who presents for the following: Rosacea (Face, Soolantra qhs, Doxycycline 20mg  2 po qd, improved), Seborrheic Keratosis (L neck, irritated by necklace), and Hx of BCC (L nasal dorsum, Mohs 04/15/20).   The following portions of the chart were reviewed this encounter and updated as appropriate:       Review of Systems:  No other skin or systemic complaints except as noted in HPI or Assessment and Plan.  Objective  Well appearing patient in no apparent distress; mood and affect are within normal limits.  A focused examination was performed including face, neck. Relevant physical exam findings are noted in the Assessment and Plan.  Objective  face: Erythema nose, mild erythema R malar cheek  Objective  L nasal dorsum: Well healed scar with no evidence of recurrence.   Objective  L lower neck x 2 (2): Erythematous keratotic or waxy stuck-on papule   Objective  eyebrows: Pink scaliness.    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 It is treatable but not curable. It sometimes affects the eyes (ocular rosacea) as well. It may respond to topical and/or systemic medication and can flare with stress, sun exposure, alcohol, exercise and some foods.  Daily application of broad spectrum spf 30+ sunscreen to face is recommended to reduce flares.  Cont Doxycycline 20mg  2 po qd with food and drink Cont Soolantra qhs Cont SPF qd  Discussed BBL  Doxycycline should be taken with food to prevent nausea. Do not lay down for 30 minutes after taking. Be cautious with sun exposure and use good sun protection while on this medication. Pregnant women should not take this medication.    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 S/p Mohs 2/22. Observe for recurrence. Call clinic for new or changing lesions.  Recommend regular skin exams, daily broad-spectrum spf 30+ sunscreen use, and photoprotection.     Inflamed seborrheic keratosis (2) L lower neck x 2  Destruction of lesion - L lower neck x 2  Destruction method: cryotherapy   Informed consent: discussed and consent obtained   Lesion destroyed using liquid nitrogen: Yes   Region frozen until ice ball extended beyond lesion: Yes   Outcome: patient tolerated procedure well with no complications   Post-procedure details: wound care instructions given    Seborrheic dermatitis eyebrows  Seborrheic Dermatitis  -  is a chronic persistent rash characterized by pinkness and scaling most commonly of the mid face but also can occur on the scalp (dandruff), ears; mid chest and mid back. It tends to be exacerbated by stress and cooler weather.  People who have neurologic disease may experience new onset or exacerbation of existing seborrheic dermatitis.  The condition is not curable but treatable and can be controlled.  Start Ketoconazole 2% cr qhs to scaly areas eyebrows, face  ketoconazole (NIZORAL) 2 % cream - eyebrows  Return in about 6 months (around 12/07/2020) for TBSE, Hx of BCC, Rosacea f/u, seb derm f/u.   I, Vanessa Walton, RMA, am acting as scribe for [name], MD .' Documentation: I have reviewed the above documentation for accuracy and completeness, and I agree with the above.  [name] MD

## 2020-06-07 NOTE — Patient Instructions (Addendum)

## 2020-07-19 ENCOUNTER — Other Ambulatory Visit: Payer: Self-pay | Admitting: Internal Medicine

## 2020-07-30 NOTE — Telephone Encounter (Signed)
Pharmacy requests refill on: Meloxicam   LAST REFILL: 05/20/20 #30, 1 refill LAST OV:  05/28/20 for vertigo by PCP  NEXT OV:  No future appointments  PHARMACY: [facility]

## 2020-08-28 ENCOUNTER — Other Ambulatory Visit: Payer: Self-pay | Admitting: Internal Medicine

## 2020-08-31 NOTE — Telephone Encounter (Signed)
Last Fill or Written Date and Quantity: 06/01/20 #90 w/ 0 Last Office Visit and Type: 05/28/20 for vertigo Next Office Visit and Type: No TOC scheduled

## 2020-09-25 ENCOUNTER — Other Ambulatory Visit: Payer: Self-pay | Admitting: Family

## 2020-12-27 ENCOUNTER — Other Ambulatory Visit: Payer: Self-pay

## 2020-12-27 ENCOUNTER — Ambulatory Visit: Payer: Medicare PPO | Admitting: Dermatology

## 2020-12-27 ENCOUNTER — Telehealth: Payer: Self-pay

## 2020-12-27 DIAGNOSIS — Z1283 Encounter for screening for malignant neoplasm of skin: Secondary | ICD-10-CM | POA: Diagnosis not present

## 2020-12-27 DIAGNOSIS — L718 Other rosacea: Secondary | ICD-10-CM

## 2020-12-27 DIAGNOSIS — L814 Other melanin hyperpigmentation: Secondary | ICD-10-CM

## 2020-12-27 DIAGNOSIS — D229 Melanocytic nevi, unspecified: Secondary | ICD-10-CM

## 2020-12-27 DIAGNOSIS — L719 Rosacea, unspecified: Secondary | ICD-10-CM

## 2020-12-27 DIAGNOSIS — L219 Seborrheic dermatitis, unspecified: Secondary | ICD-10-CM | POA: Diagnosis not present

## 2020-12-27 DIAGNOSIS — D692 Other nonthrombocytopenic purpura: Secondary | ICD-10-CM

## 2020-12-27 DIAGNOSIS — D225 Melanocytic nevi of trunk: Secondary | ICD-10-CM | POA: Diagnosis not present

## 2020-12-27 DIAGNOSIS — L821 Other seborrheic keratosis: Secondary | ICD-10-CM

## 2020-12-27 DIAGNOSIS — Z85828 Personal history of other malignant neoplasm of skin: Secondary | ICD-10-CM

## 2020-12-27 DIAGNOSIS — L578 Other skin changes due to chronic exposure to nonionizing radiation: Secondary | ICD-10-CM

## 2020-12-27 DIAGNOSIS — D239 Other benign neoplasm of skin, unspecified: Secondary | ICD-10-CM

## 2020-12-27 DIAGNOSIS — D485 Neoplasm of uncertain behavior of skin: Secondary | ICD-10-CM

## 2020-12-27 HISTORY — DX: Other benign neoplasm of skin, unspecified: D23.9

## 2020-12-27 MED ORDER — DOXYCYCLINE HYCLATE 20 MG PO TABS: ORAL_TABLET | ORAL | 11 refills | Status: DC

## 2020-12-27 MED ORDER — PIMECROLIMUS 1 % EX CREA: TOPICAL_CREAM | CUTANEOUS | 1 refills | Status: DC

## 2020-12-27 MED ORDER — HYDROCORTISONE 2.5 % EX CREA
TOPICAL_CREAM | CUTANEOUS | 1 refills | Status: DC
Start: 2020-12-27 — End: 2021-10-21

## 2020-12-27 NOTE — Progress Notes (Signed)
Follow-Up Visit   Subjective  Vanessa Walton is a 70 y.o. female who presents for the following: Annual Exam.  Patient presents for TBSE. She has a brown spot on her left lower cheek she would like treated, no symptoms. She takes doxycycline 20mg  2 pills daily and Soolantra Cream nightly for Rosacea with ocular rosacea. She also has Seborrheic Dermatitis of the eyebrows, not improving with ketoconazole cream. She uses Tea Tree Soap on her eyebrows, recommended by her ophthalmologist. History of BCC of the left nasal dorsum, treated with [facility] 04/2020.  The following portions of the chart were reviewed this encounter and updated as appropriate:       Review of Systems:  No other skin or systemic complaints except as noted in HPI or Assessment and Plan.  Objective  Well appearing patient in no apparent distress; mood and affect are within normal limits.  A full examination was performed including scalp, head, eyes, ears, nose, lips, neck, chest, axillae, abdomen, back, buttocks, bilateral upper extremities, bilateral lower extremities, hands, feet, fingers, toes, fingernails, and toenails. All findings within normal limits unless otherwise noted below.  left nasal dorsum Well healed scar with no evidence of recurrence.   Bilateral Eyebrow Mild erythema and scale.  cheeks, nose Mild erythema with telangiectasias.  Abdomen Regular brown macules.       Spinal mid upper back 51mm med dark brown speckled macule       Left Lower Back 21mm med dark brown speckled macule       Left Lower Cheek Waxy tan macule.      Assessment & Plan  Skin cancer screening performed today. Actinic Damage - chronic, secondary to cumulative UV radiation exposure/sun exposure over time - diffuse scaly erythematous macules with underlying dyspigmentation - Recommend daily broad spectrum sunscreen SPF 30+ to sun-exposed areas, reapply every 2 hours as needed.  - Recommend staying in the  shade or wearing long sleeves, sun glasses (UVA+UVB protection) and wide brim hats (4-inch brim around the entire circumference of the hat). - Call for new or changing lesions. Lentigines - Scattered tan macules - Due to sun exposure - Benign-appering, observe - Recommend daily broad spectrum sunscreen SPF 30+ to sun-exposed areas, reapply every 2 hours as needed. - Call for any changes Seborrheic Keratoses - Stuck-on, waxy, tan-brown papules and/or plaques  - Benign-appearing - Discussed benign etiology and prognosis. - Observe - Call for any changes  Melanocytic Nevi - Tan-brown and/or pink-flesh-colored symmetric macules and papules, including fleshy papule of the right inframammary - Benign appearing on exam today - Observation - Call clinic for new or changing moles - Recommend daily use of broad spectrum spf 30+ sunscreen to sun-exposed areas.  Purpura - Chronic; persistent and recurrent.  Treatable, but not curable. - Violaceous macules and patches - Benign - Related to trauma, age, sun damage and/or use of blood thinners, chronic use of topical and/or oral steroids - Observe - Can use OTC arnica containing moisturizer such as Dermend Bruise Formula if desired - Call for worsening or other concerns  History of basal cell carcinoma (BCC) left nasal dorsum  Clear. Observe for recurrence. Call clinic for new or changing lesions.  Recommend regular skin exams, daily broad-spectrum spf 30+ sunscreen use, and photoprotection.    Seborrheic dermatitis Bilateral Eyebrow  Seborrheic Dermatitis  -  is a chronic persistent rash characterized by pinkness and scaling most commonly of the mid face but also can occur on the scalp (dandruff), ears; mid chest, mid  back and groin.  It tends to be exacerbated by stress and cooler weather.  People who have neurologic disease may experience new onset or exacerbation of existing seborrheic dermatitis.  The condition is not curable but  treatable and can be controlled.  Start 2.5% hydrocortisone Apply qd/bid as directed dsp 30g 1Rf. Continue ketoconazole 2% cream qd/bid as directed. Start Elidel Cream Apply to eyebrows qd/bid dsp 30g  pimecrolimus (ELIDEL) 1 % cream - Bilateral Eyebrow Apply to eyebrows 1-2 times a day until improved.  hydrocortisone 2.5 % cream - Bilateral Eyebrow Apply to eyebrows 1-2 times a day as directed.  Rosacea cheeks, nose  With ocular rosacea. Controlled  Rosacea is a chronic progressive skin condition usually affecting the face of adults, causing redness and/or acne bumps. It is treatable but not curable. It sometimes affects the eyes (ocular rosacea) as well. It may respond to topical and/or systemic medication and can flare with stress, sun exposure, alcohol, exercise and some foods.  Daily application of broad spectrum spf 30+ sunscreen to face is recommended to reduce flares.  Continue Soolantra cream qhs to face Continue Doxycycline 20 mg take 2 PO qd   Doxycycline should be taken with food to prevent nausea. Do not lay down for 30 minutes after taking. Be cautious with sun exposure and use good sun protection while on this medication. Pregnant women should not take this medication.    Related Medications Ivermectin (SOOLANTRA) 1 % CREA Apply to face nightly.  doxycycline (PERIOSTAT) 20 MG tablet Take 2 by mouth with food daily  Nevus Abdomen  Benign-appearing.  Observation.  Call clinic for new or changing moles.  Recommend daily use of broad spectrum spf 30+ sunscreen to sun-exposed areas.   Neoplasm of uncertain behavior of skin (2) Spinal mid upper back  Epidermal / dermal shaving  Lesion diameter (cm):  0.7 Informed consent: discussed and consent obtained   Patient was prepped and draped in usual sterile fashion: Area prepped with alcohol. Anesthesia: the lesion was anesthetized in a standard fashion   Anesthetic:  1% lidocaine w/ epinephrine 1-100,000 buffered w/  8.4% NaHCO3 Instrument used: flexible razor blade   Hemostasis achieved with: pressure, aluminum chloride and electrodesiccation   Outcome: patient tolerated procedure well   Post-procedure details: wound care instructions given   Post-procedure details comment:  Ointment and small bandage applied  Specimen 1 - Surgical pathology Differential Diagnosis: Nevus r/o Dysplasia Check Margins: Yes 11mm med dark brown speckled macule  Left Lower Back  Epidermal / dermal shaving  Lesion diameter (cm):  0.7 Informed consent: discussed and consent obtained   Patient was prepped and draped in usual sterile fashion: Area prepped with alcohol. Anesthesia: the lesion was anesthetized in a standard fashion   Anesthetic:  1% lidocaine w/ epinephrine 1-100,000 buffered w/ 8.4% NaHCO3 Instrument used: flexible razor blade   Hemostasis achieved with: pressure, aluminum chloride and electrodesiccation   Outcome: patient tolerated procedure well   Post-procedure details: wound care instructions given   Post-procedure details comment:  Ointment and small bandage applied  Specimen 2 - Surgical pathology Differential Diagnosis: Nevus r/o Dysplasia Check Margins: Yes 30mm med dark brown speckled macule  Seborrheic keratosis Left Lower Cheek  Vs Lentigo  Discussed cosmetic procedure, noncovered.  $60 for 1st lesion and $15 for each additional lesion if done on the same day.  Maximum charge $350.  One touch-up treatment included no charge. Discussed risks of treatment including dyspigmentation, small scar, and/or recurrence. Recommend daily broad spectrum sunscreen  SPF 30+/photoprotection to treated areas once healed.   Destruction of lesion - Left Lower Cheek  Destruction method: cryotherapy   Informed consent: discussed and consent obtained   Lesion destroyed using liquid nitrogen: Yes   Region frozen until ice ball extended beyond lesion: Yes   Outcome: patient tolerated procedure well with no  complications   Post-procedure details: wound care instructions given   Additional details:  Prior to procedure, discussed risks of blister formation, small wound, skin dyspigmentation, or rare scar following cryotherapy. Recommend Vaseline ointment to treated areas while healing.   Return in about 1 year (around 12/27/2021) for TBSE.  IJamesetta Orleans, CMA, am acting as scribe for [name], MD .  Documentation: I have reviewed the above documentation for accuracy and completeness, and I agree with the above.  [name] MD

## 2020-12-27 NOTE — Telephone Encounter (Signed)
Elidel denied by pt insurance. Pt must try and fail -   Triamcinolone 0.025%, 0.1%, 0.5% Mometasone Betamethasone dipropionate

## 2020-12-27 NOTE — Patient Instructions (Addendum)
Seborrheic Dermatitis  Mix hydrocortisone with ketaconazole 2% twice a day to eyebrows. If improved, decrease to hydrocortisone and ketaconazole mixed once a day. If still clear, decrease to ketaconazole only. May also try Soolantra Cream.    Cryotherapy Aftercare  Wash gently with soap and water everyday.   Apply Vaseline and Band-Aid daily until healed.    Wound Care Instructions  Cleanse wound gently with soap and water once a day then pat dry with clean gauze. Apply a thing coat of Petrolatum (petroleum jelly, "Vaseline") over the wound (unless you have an allergy to this). We recommend that you use a new, sterile tube of Vaseline. Do not pick or remove scabs. Do not remove the yellow or white "healing tissue" from the base of the wound.  Cover the wound with fresh, clean, nonstick gauze and secure with paper tape. You may use Band-Aids in place of gauze and tape if the would is small enough, but would recommend trimming much of the tape off as there is often too much. Sometimes Band-Aids can irritate the skin.  You should call the office for your biopsy report after 1 week if you have not already been contacted.  If you experience any problems, such as abnormal amounts of bleeding, swelling, significant bruising, significant pain, or evidence of infection, please call the office immediately.  FOR ADULT SURGERY PATIENTS: If you need something for pain relief you may take 1 extra strength Tylenol (acetaminophen) AND 2 Ibuprofen (200mg  each) together every 4 hours as needed for pain. (do not take these if you are allergic to them or if you have a reason you should not take them.) Typically, you may only need pain medication for 1 to 3 days.     If you have any questions or concerns for your doctor, please call our main line at [phone] and press option 4 to reach your doctor's medical assistant. If no one answers, please leave a voicemail as directed and we will return your call as  soon as possible. Messages left after 4 pm will be answered the following business day.   You may also send [place] a message via [place]. We typically respond to MyChart messages within 1-2 business days.  For prescription refills, please ask your pharmacy to contact our office. Our fax number is [phone].  If you have an urgent issue when the clinic is closed that cannot wait until the next business day, you can page your doctor at the number below.    Please note that while we do our best to be available for urgent issues outside of office hours, we are not available 24/7.   If you have an urgent issue and are unable to reach [place], you may choose to seek medical care at your doctor's office, retail clinic, urgent care center, or emergency room.  If you have a medical emergency, please immediately call 911 or go to the emergency department.  Pager Numbers  - Dr. Nehemiah Massed: [phone]  - Dr. Laurence Ferrari: [phone]  - Dr. Nicole Kindred: [phone]  In the event of inclement weather, please call our main line at [phone] for an update on the status of any delays or closures.  Dermatology Medication Tips: Please keep the boxes that topical medications come in in order to help keep track of the instructions about where and how to use these. Pharmacies typically print the medication instructions only on the boxes and not directly on the medication tubes.   If your medication is too expensive, please  contact our office at [phone] option 4 or send [place] a message through [place].   We are unable to tell what your co-pay for medications will be in advance as this is different depending on your insurance coverage. However, we may be able to find a substitute medication at lower cost or fill out paperwork to get insurance to cover a needed medication.   If a prior authorization is required to get your medication covered by your insurance company, please allow [place] 1-2 business days to complete this  process.  Drug prices often vary depending on where the prescription is filled and some pharmacies may offer cheaper prices.  The website www.goodrx.com contains coupons for medications through different pharmacies. The prices here do not account for what the cost may be with help from insurance (it may be cheaper with your insurance), but the website can give you the price if you did not use any insurance.  - You can print the associated coupon and take it with your prescription to the pharmacy.  - You may also stop by our office during regular business hours and pick up a GoodRx coupon card.  - If you need your prescription sent electronically to a different pharmacy, notify our office through [facility] or by phone at [phone] option 4.

## 2021-01-03 ENCOUNTER — Telehealth: Payer: Self-pay

## 2021-01-03 NOTE — Telephone Encounter (Signed)
Called pt on 12/27/20 and informed her of denial and advised to continue using HC 2.5% to the affected area for now.

## 2021-01-03 NOTE — Telephone Encounter (Signed)
Advised pt of bx results and scheduled pt for 2 surgeries. 

## 2021-01-03 NOTE — Telephone Encounter (Signed)
-----   Message from [name], MD sent at 01/03/2021  9:08 AM EDT ----- 1. 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 and 2. Severely atypical moles, both need excision - please call patient

## 2021-01-13 ENCOUNTER — Encounter: Payer: Self-pay | Admitting: Radiology

## 2021-01-21 ENCOUNTER — Telehealth: Payer: Medicare PPO | Admitting: Physician Assistant

## 2021-01-21 DIAGNOSIS — U071 COVID-19: Secondary | ICD-10-CM

## 2021-01-21 MED ORDER — MOLNUPIRAVIR EUA 200MG CAPSULE
4.0000 | ORAL_CAPSULE | Freq: Two times a day (BID) | ORAL | 0 refills | Status: AC
Start: 2021-01-21 — End: 2021-01-26

## 2021-01-21 MED ORDER — BENZONATATE 100 MG PO CAPS: 100.0000 mg | ORAL_CAPSULE | Freq: Three times a day (TID) | ORAL | 0 refills | Status: DC | PRN

## 2021-01-21 NOTE — Patient Instructions (Signed)
Hello Vanessa Walton,  You are being placed in the home monitoring program for COVID-19 (commonly known as Coronavirus).  This is because you are suspected to have the virus or are known to have the virus.  If you are unsure which group you fall into call your clinic.    As part of this program, you'll answer a daily questionnaire in the MyChart mobile app. You'll receive a notification through the MyChart app when the questionnaire is available. When you log in to MyChart, you'll see the tasks in your To Do activity.       Clinicians will see any answers that are concerning and take appropriate steps.  If at any point you are having a medical emergency, call 911.  If otherwise concerned call your clinic instead of coming into the clinic or hospital.  To keep from spreading the disease you should: Stay home and limit contact with other people as much as possible.  Wash your hands frequently. Cover your coughs and sneezes with a tissue, and throw used tissues in the trash.   Clean and disinfect frequently touched surfaces and objects.    Take care of yourself by: Staying home Resting Drinking fluids Take fever-reducing medications (Tylenol/Acetaminophen and Ibuprofen)  For more information on the disease go to the Centers for Disease Control and Prevention website     You are being prescribed MOLNUPIRAVIR for COVID-19 infection.   Please call the pharmacy or go through the drive through vs going inside if you are picking up the mediation yourself to prevent further spread. If prescribed to a [facility] affiliated pharmacy, a pharmacist will bring the medication out to your car.   ADMINISTRATION INSTRUCTIONS: Take with or without food. Swallow the tablets whole. Don't chew, crush, or break the medications because it might not work as well  For each dose of the medication, you should be taking FOUR tablets at one time, TWICE a day   Finish your full five-day course of Molnupiravir even if  you feel better before you're done. Stopping this medication too early can make it less effective to prevent severe illness related to [place].    Molnupiravir is prescribed for YOU ONLY. Don't share it with others, even if they have similar symptoms as you. This medication might not be right for everyone.   Make sure to take steps to protect yourself and others while you're taking this medication in order to get well soon and to prevent others from getting sick with COVID-19.   **If you are of childbearing potential (any gender) - it is advised to not get pregnant while taking this medication and recommended that condoms are used for female partners the next 3 months after taking the medication out of extreme caution    COMMON SIDE EFFECTS: Diarrhea Nausea  Dizziness    If your COVID-19 symptoms get worse, get medical help right away. Call 911 if you experience symptoms such as worsening cough, trouble breathing, chest pain that doesn't go away, confusion, a hard time staying awake, and pale or blue-colored skin. This medication won't prevent all COVID-19 cases from getting worse.   Can take to lessen severity: Vit C 500mg  twice daily Quercertin 250-500mg  twice daily Zinc 75-100mg  daily Melatonin 3-6 mg at bedtime Vit D3 1000-2000 IU daily Aspirin 81 mg daily with food Optional: Famotidine 20mg  daily Also can add tylenol/ibuprofen as needed for fevers and body aches May add Mucinex or Mucinex DM as needed for cough/congestion  10 Things You Can Do  to Manage Your COVID-19 Symptoms at Home If you have possible or confirmed COVID-19 Stay home except to get medical care. Monitor your symptoms carefully. If your symptoms get worse, call your healthcare provider immediately. Get rest and stay hydrated. If you have a medical appointment, call the healthcare provider ahead of time and tell them that you have or may have COVID-19. For medical emergencies, call 911 and notify the dispatch  personnel that you have or may have COVID-19. Cover your cough and sneezes with a tissue or use the inside of your elbow. Wash your hands often with soap and water for at least 20 seconds or clean your hands with an alcohol-based hand sanitizer that contains at least 60% alcohol. As much as possible, stay in a specific room and away from other people in your home. Also, you should use a separate bathroom, if available. If you need to be around other people in or outside of the home, wear a mask. Avoid sharing personal items with other people in your household, like dishes, towels, and bedding. Clean all surfaces that are touched often, like counters, tabletops, and doorknobs. Use household cleaning sprays or wipes according to the label instructions. [email or web address] 09/19/2019 This information is not intended to replace advice given to you by your health care provider. Make sure you discuss any questions you have with your health care provider. Document Revised: 11/12/2020 Document Reviewed: 11/12/2020 Elsevier Patient Education  [place].

## 2021-01-21 NOTE — Progress Notes (Signed)
Virtual Visit Consent   [name], you are scheduled for a virtual visit with a [facility] provider today.     Just as with appointments in the office, your consent must be obtained to participate.  Your consent will be active for this visit and any virtual visit you may have with one of our providers in the next 365 days.     If you have a MyChart account, a copy of this consent can be sent to you electronically.  All virtual visits are billed to your insurance company just like a traditional visit in the office.    As this is a virtual visit, video technology does not allow for your provider to perform a traditional examination.  This may limit your provider's ability to fully assess your condition.  If your provider identifies any concerns that need to be evaluated in person or the need to arrange testing (such as labs, EKG, etc.), we will make arrangements to do so.     Although advances in technology are sophisticated, we cannot ensure that it will always work on either your end or our end.  If the connection with a video visit is poor, the visit may have to be switched to a telephone visit.  With either a video or telephone visit, we are not always able to ensure that we have a secure connection.     I need to obtain your verbal consent now.   Are you willing to proceed with your visit today?    Vanessa Walton has provided verbal consent on 01/21/2021 for a virtual visit (video or telephone).   Mar Daring, PA-C   Date: 01/21/2021 8:00 AM   Virtual Visit via Video Note   I, Mar Daring, connected with  Vanessa Walton  ([id number], 06-09-1950) on 01/21/21 at  7:45 AM EST by a video-enabled telemedicine application and verified that I am speaking with the correct person using two identifiers.  Location: Patient: Virtual Visit Location Patient: Home Provider: Virtual Visit Location Provider: Home Office   I discussed the limitations of evaluation and management by  telemedicine and the availability of in person appointments. The patient expressed understanding and agreed to proceed.    History of Present Illness: Vanessa Walton is a 70 y.o. who identifies as a female who was assigned female at birth, and is being seen today for Covid 14.  HPI: URI  This is a new problem. Episode onset: Symptoms started last night; tested positive for Covid this morning. The problem has been gradually worsening. There has been no fever. Associated symptoms include congestion, coughing, headaches, rhinorrhea, sinus pain and a sore throat. Pertinent negatives include no diarrhea, ear pain, nausea, plugged ear sensation or vomiting. She has tried antihistamine, NSAIDs, increased fluids and sleep for the symptoms. The treatment provided mild relief.     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   HLD (hyperlipidemia) 08/01/2013   GERD (gastroesophageal reflux disease) 08/01/2013    Allergies: No Known Allergies Medications:  Current Outpatient Medications:    benzonatate (TESSALON) 100 MG capsule, Take 1 capsule (100 mg total) by mouth 3 (three) times daily as needed., Disp: 30 capsule, Rfl: 0   molnupiravir EUA (LAGEVRIO) 200 mg CAPS capsule, Take 4 capsules (800 mg total) by mouth 2 (  two) times daily for 5 days., Disp: 40 capsule, Rfl: 0   B Complex Vitamins (B COMPLEX PO), Take by mouth daily., Disp: , Rfl:    Biotin 10000 MCG TABS, Take by mouth., Disp: , Rfl:    Cholecalciferol (VITAMIN D3) 50 MCG (2000 UT) TABS, Take by mouth., Disp: , Rfl:    doxycycline (PERIOSTAT) 20 MG tablet, Take 2 by mouth with food daily, Disp: 60 tablet, Rfl: 11   GLUCOSAMINE CHONDROITIN COMPLX PO, Take by mouth daily., Disp: , Rfl:    hydrocortisone 2.5 % cream, Apply to eyebrows 1-2  times a day as directed., Disp: 30 g, Rfl: 1   Ivermectin (SOOLANTRA) 1 % CREA, Apply to face nightly., Disp: 45 g, Rfl: 2   meloxicam (MOBIC) 15 MG tablet, TAKE 1 TABLET BY MOUTH EVERY DAY (Patient not taking: Reported on 12/27/2020), Disp: 30 tablet, Rfl: 1   Omega-3 Fatty Acids (FISH OIL DOUBLE STRENGTH) 1200 MG CAPS, Take by mouth 2 (two) times daily., Disp: , Rfl:    pimecrolimus (ELIDEL) 1 % cream, Apply to eyebrows 1-2 times a day until improved., Disp: 30 g, Rfl: 1   Probiotic Product (PROBIOTIC DAILY PO), Take by mouth., Disp: , Rfl:    rosuvastatin (CRESTOR) 10 MG tablet, TAKE 1 TABLET BY MOUTH EVERY DAY, Disp: 90 tablet, Rfl: 0   valACYclovir (VALTREX) 1000 MG tablet, Take 1 tablet (1,000 mg total) by mouth daily. For 5 days. May repeat if needed, Disp: 30 tablet, Rfl: 2   vitamin E 400 UNIT capsule, Take 400 Units by mouth daily., Disp: , Rfl:    XIIDRA 5 % SOLN, , Disp: , Rfl:   Observations/Objective: Patient is well-developed, well-nourished in no acute distress.  Resting comfortably at home.  Head is normocephalic, atraumatic.  No labored breathing.  Speech is clear and coherent with logical content.  Patient is alert and oriented at baseline.    Assessment and Plan: 1. COVID-19 - MyChart COVID-19 home monitoring program; Future - molnupiravir EUA (LAGEVRIO) 200 mg CAPS capsule; Take 4 capsules (800 mg total) by mouth 2 (two) times daily for 5 days.  Dispense: 40 capsule; Refill: 0 - benzonatate (TESSALON) 100 MG capsule; Take 1 capsule (100 mg total) by mouth 3 (three) times daily as needed.  Dispense: 30 capsule; Refill: 0  - Continue OTC symptomatic management of choice - Will send OTC vitamins and supplement information through AVS - Molnupiravir prescribed - Tessalon perles for cough - Patient enrolled in MyChart symptom monitoring - Push fluids - Rest as needed - Discussed return precautions and when to seek in-person evaluation, sent via AVS as well  Follow  Up Instructions: I discussed the assessment and treatment plan with the patient. The patient was provided an opportunity to ask questions and all were answered. The patient agreed with the plan and demonstrated an understanding of the instructions.  A copy of instructions were sent to the patient via MyChart unless otherwise noted below.   The patient was advised to call back or seek an in-person evaluation if the symptoms worsen or if the condition fails to improve as anticipated.  Time:  I spent 12 minutes with the patient via telehealth technology discussing the above problems/concerns.    Mar Daring, PA-C

## 2021-03-14 ENCOUNTER — Encounter: Payer: Self-pay | Admitting: Dermatology

## 2021-03-14 ENCOUNTER — Other Ambulatory Visit: Payer: Self-pay

## 2021-03-14 ENCOUNTER — Ambulatory Visit: Payer: Medicare PPO | Admitting: Dermatology

## 2021-03-14 DIAGNOSIS — L219 Seborrheic dermatitis, unspecified: Secondary | ICD-10-CM

## 2021-03-14 DIAGNOSIS — D235 Other benign neoplasm of skin of trunk: Secondary | ICD-10-CM

## 2021-03-14 DIAGNOSIS — D239 Other benign neoplasm of skin, unspecified: Secondary | ICD-10-CM

## 2021-03-14 NOTE — Progress Notes (Signed)
Follow-Up Visit   Subjective  Vanessa Walton is a 71 y.o. female who presents for the following: Dysplastic Nevi (Spinal mid upper back, L lower back, pt presents for surgery of one of the dysplastic nevi). Pt has question about recently denied elidel cream for itchy eyebrows.  Wants to know if she should continue using the HC cream.  The following portions of the chart were reviewed this encounter and updated as appropriate:       Review of Systems:  No other skin or systemic complaints except as noted in HPI or Assessment and Plan.  Objective  Well appearing patient in no apparent distress; mood and affect are within normal limits.  A focused examination was performed including back. Relevant physical exam findings are noted in the Assessment and Plan.  Left Lower Back; spinal mid upper back  spinal mid upper back Pink bx site 0.8cm  Left Lower Back Pink bx site    Assessment & Plan  Dysplastic nevus (2) Left Lower Back; spinal mid upper back  Severe, Bx proven  Spinal mid upper back excised today  L lower back plan excision on f/u  Skin excision - spinal mid upper back  Lesion length (cm):  0.8 Lesion width (cm):  0.8 Margin per side (cm):  0.2 Total excision diameter (cm):  1.2 Informed consent: discussed and consent obtained   Timeout: patient name, date of birth, surgical site, and procedure verified   Procedure prep:  Patient was prepped and draped in usual sterile fashion Prep type:  Povidone-iodine Anesthesia: the lesion was anesthetized in a standard fashion   Anesthetic:  1% lidocaine w/ epinephrine 1-100,000 buffered w/ 8.4% NaHCO3 (3.0cc lido w/ epi, 6cc bupivicaine, Total 9cc) Instrument used: #15 blade   Hemostasis achieved with: suture and pressure   Hemostasis achieved with comment:  Electrocautery Outcome: patient tolerated procedure well with no complications   Additional details:  Tag at 12:00 superior   Skin repair - spinal mid upper  back Complexity:  Intermediate Final length (cm):  2.6 Informed consent: discussed and consent obtained   Timeout: patient name, date of birth, surgical site, and procedure verified   Reason for type of repair: reduce tension to allow closure, reduce the risk of dehiscence, infection, and necrosis, reduce subcutaneous dead space and avoid a hematoma, preserve normal anatomical and functional relationships and enhance both functionality and cosmetic results   Undermining: edges undermined   Subcutaneous layers (deep stitches):  Suture size:  3-0 Suture type: Vicryl (polyglactin 910)   Subcutaneous suture technique: inverted dermal. Fine/surface layer approximation (top stitches):  Suture size:  3-0 Suture type: nylon   Stitches: simple interrupted   Suture removal (days):  7 Hemostasis achieved with: suture Outcome: patient tolerated procedure well with no complications   Post-procedure details: sterile dressing applied and wound care instructions given   Dressing type: pressure dressing (Mupirocin ointment)    Specimen 1 - Surgical pathology Differential Diagnosis: D48.5 Bx proven Severe dysplastic nevus  Check Margins: yes Pink bx site 0.8cm [id number] Tag at 12:00 superior   Seborrheic dermatitis eyebrows  Seborrheic Dermatitis  -  is a chronic persistent rash characterized by pinkness and scaling most commonly of the mid face but also can occur on the scalp (dandruff), ears; mid chest, mid back and groin.  It tends to be exacerbated by stress and cooler weather.  People who have neurologic disease may experience new onset or exacerbation of existing seborrheic dermatitis.  The condition is not curable but  treatable and can be controlled.  Cont HC 2.5% cr qd up to 5d/wk aa eyebrows prn flares Cont Ketoconazole 2% cr qd prn flares  If Elidel approved with appeal, will start Elidel qd prn flares and can d/c HC 2.5% cr  Related Medications pimecrolimus (ELIDEL) 1 %  cream Apply to eyebrows 1-2 times a day until improved.  hydrocortisone 2.5 % cream Apply to eyebrows 1-2 times a day as directed.   Return in about 1 week (around 03/21/2021) for suture removal, Surgery.  I, [name], RMA, am acting as scribe for [name], MD .  Documentation: I have reviewed the above documentation for accuracy and completeness, and I agree with the above.  [name] MD

## 2021-03-14 NOTE — Patient Instructions (Signed)

## 2021-03-15 ENCOUNTER — Telehealth: Payer: Self-pay

## 2021-03-15 NOTE — Telephone Encounter (Signed)
Left pt message to call if any problems after yesterdays surgery./sh 

## 2021-03-21 ENCOUNTER — Ambulatory Visit: Payer: Medicare PPO | Admitting: Dermatology

## 2021-03-21 ENCOUNTER — Encounter: Payer: Self-pay | Admitting: Dermatology

## 2021-03-21 ENCOUNTER — Other Ambulatory Visit: Payer: Self-pay

## 2021-03-21 DIAGNOSIS — D235 Other benign neoplasm of skin of trunk: Secondary | ICD-10-CM | POA: Diagnosis not present

## 2021-03-21 DIAGNOSIS — D239 Other benign neoplasm of skin, unspecified: Secondary | ICD-10-CM

## 2021-03-21 DIAGNOSIS — L821 Other seborrheic keratosis: Secondary | ICD-10-CM

## 2021-03-21 NOTE — Patient Instructions (Addendum)

## 2021-03-21 NOTE — Progress Notes (Signed)
Follow-Up Visit   Subjective  Vanessa Walton is a 71 y.o. female who presents for the following: Dysplastic Nevus with severe atypia (Left lower back. Patient presents for excision. ) and Post op (Spinal mid upper back. Severe dysplastic nevus, margins free. ).   The following portions of the chart were reviewed this encounter and updated as appropriate:       Review of Systems:  No other skin or systemic complaints except as noted in HPI or Assessment and Plan.  Objective  Well appearing patient in no apparent distress; mood and affect are within normal limits.  A focused examination was performed including back. Relevant physical exam findings are noted in the Assessment and Plan.  spinal mid upper back Healing excision site.   Left Lower Back 0.9 x 0.5 cm pink biopsy site  L lower cheek Residual waxy tan macule.    Assessment & Plan  Dysplastic nevus (2) spinal mid upper back; Left Lower Back  Left lower back - excision today.   Encounter for Removal of Sutures - Incision site at the spinal mid upper back is clean, dry and intact - Wound cleansed, sutures removed, wound cleansed and steri strips applied.  - Discussed pathology results showing no residual dysplastic nevus, margins free  - Patient advised to keep steri-strips dry until they fall off. - Scars remodel for a full year. - Once steri-strips fall off, patient can apply over-the-counter silicone scar cream each night to help with scar remodeling if desired. - Patient advised to call with any concerns or if they notice any new or changing lesions.     Skin excision - Left Lower Back  Lesion length (cm):  0.9 Lesion width (cm):  0.5 Margin per side (cm):  0.2 Total excision diameter (cm):  1.3 Informed consent: discussed and consent obtained   Timeout: patient name, date of birth, surgical site, and procedure verified   Procedure prep:  Patient was prepped and draped in usual sterile fashion Prep  type:  Povidone-iodine Anesthesia: the lesion was anesthetized in a standard fashion   Anesthesia comment:  Total 12 cc - 9cc bupivicaine, 3cc lido w/epi Anesthetic:  1% lidocaine w/ epinephrine 1-100,000 buffered w/ 8.4% NaHCO3 (0.5% bupivicaine) Instrument used: #15 blade   Hemostasis achieved with: pressure and electrodesiccation   Outcome: patient tolerated procedure well with no complications   Additional details:  Tag medial 3:00 tip  Skin repair - Left Lower Back Complexity:  Intermediate Final length (cm):  2.4 Informed consent: discussed and consent obtained   Timeout: patient name, date of birth, surgical site, and procedure verified   Reason for type of repair: reduce tension to allow closure, reduce the risk of dehiscence, infection, and necrosis, reduce subcutaneous dead space and avoid a hematoma, preserve normal anatomical and functional relationships and enhance both functionality and cosmetic results   Undermining: edges undermined   Subcutaneous layers (deep stitches):  Suture size:  3-0 Suture type: Vicryl (polyglactin 910)   Stitches:  Buried vertical mattress Fine/surface layer approximation (top stitches):  Suture size:  3-0 Suture type: nylon   Stitches: simple interrupted   Suture removal (days):  7 Hemostasis achieved with: suture Outcome: patient tolerated procedure well with no complications   Post-procedure details: sterile dressing applied and wound care instructions given   Dressing type: pressure dressing (mupirocin)    Specimen 1 - Surgical pathology Differential Diagnosis: Dysplastic nevus with severe atypia Check Margins: Yes 0.9 x 0.5 cm pink biopsy site [id number] Tag medial  3:00 tip  Seborrheic keratosis L lower cheek  vs Lentigo, residual  Cosmetic follow-up, plan touch-up treatment at no charge next visit.    Return in about 1 week (around 03/28/2021) for suture removal and tx residual SK.  IJamesetta Orleans, CMA, am acting as  scribe for [name], MD .  Documentation: I have reviewed the above documentation for accuracy and completeness, and I agree with the above.  [name] MD

## 2021-03-22 ENCOUNTER — Telehealth: Payer: Self-pay

## 2021-03-22 NOTE — Telephone Encounter (Signed)
Left message for patient to call with any problems from surgery yesterday.  

## 2021-03-28 ENCOUNTER — Ambulatory Visit (INDEPENDENT_AMBULATORY_CARE_PROVIDER_SITE_OTHER): Payer: Medicare PPO | Admitting: Dermatology

## 2021-03-28 ENCOUNTER — Other Ambulatory Visit: Payer: Self-pay

## 2021-03-28 DIAGNOSIS — L821 Other seborrheic keratosis: Secondary | ICD-10-CM

## 2021-03-28 DIAGNOSIS — D239 Other benign neoplasm of skin, unspecified: Secondary | ICD-10-CM

## 2021-03-28 DIAGNOSIS — D235 Other benign neoplasm of skin of trunk: Secondary | ICD-10-CM

## 2021-03-28 NOTE — Patient Instructions (Signed)
Cryotherapy Aftercare  Wash gently with soap and water everyday.   Apply Vaseline and Band-Aid daily until healed.   Recommend daily broad spectrum sunscreen SPF 30+ to sun-exposed areas, reapply every 2 hours as needed. Call for new or changing lesions.  Staying in the shade or wearing long sleeves, sun glasses (UVA+UVB protection) and wide brim hats (4-inch brim around the entire circumference of the hat) are also recommended for sun protection.   If You Need Anything After Your Visit  If you have any questions or concerns for your doctor, please call our main line at [phone] and press option 4 to reach your doctor's medical assistant. If no one answers, please leave a voicemail as directed and we will return your call as soon as possible. Messages left after 4 pm will be answered the following business day.   You may also send [place] a message via [place]. We typically respond to MyChart messages within 1-2 business days.  For prescription refills, please ask your pharmacy to contact our office. Our fax number is [phone].  If you have an urgent issue when the clinic is closed that cannot wait until the next business day, you can page your doctor at the number below.    Please note that while we do our best to be available for urgent issues outside of office hours, we are not available 24/7.   If you have an urgent issue and are unable to reach [place], you may choose to seek medical care at your doctor's office, retail clinic, urgent care center, or emergency room.  If you have a medical emergency, please immediately call 911 or go to the emergency department.  Pager Numbers  - Dr. Nehemiah Massed: [phone]  - Dr. Laurence Ferrari: [phone]  - Dr. Nicole Kindred: [phone]  In the event of inclement weather, please call our main line at [phone] for an update on the status of any delays or closures.  Dermatology Medication Tips: Please keep the boxes that topical medications come in in  order to help keep track of the instructions about where and how to use these. Pharmacies typically print the medication instructions only on the boxes and not directly on the medication tubes.   If your medication is too expensive, please contact our office at [phone] option 4 or send [place] a message through [place].   We are unable to tell what your co-pay for medications will be in advance as this is different depending on your insurance coverage. However, we may be able to find a substitute medication at lower cost or fill out paperwork to get insurance to cover a needed medication.   If a prior authorization is required to get your medication covered by your insurance company, please allow [place] 1-2 business days to complete this process.  Drug prices often vary depending on where the prescription is filled and some pharmacies may offer cheaper prices.  The website www.goodrx.com contains coupons for medications through different pharmacies. The prices here do not account for what the cost may be with help from insurance (it may be cheaper with your insurance), but the website can give you the price if you did not use any insurance.  - You can print the associated coupon and take it with your prescription to the pharmacy.  - You may also stop by our office during regular business hours and pick up a GoodRx coupon card.  - If you need your prescription sent electronically to a different pharmacy, notify our office through [facility]  Health MyChart or by phone at [phone] option 4.     Si Usted Necesita Algo Despus de Su Visita  Tambin puede enviarnos un mensaje a travs de Pharmacist, community. Por lo general respondemos a los mensajes de MyChart en el transcurso de 1 a 2 das hbiles.  Para renovar recetas, por favor pida a su farmacia que se ponga en contacto con nuestra oficina. Harland Dingwall de fax es [place] [phone].  Si tiene un asunto urgente cuando la clnica est cerrada y que no puede  esperar hasta el siguiente da hbil, puede llamar/localizar a su doctor(a) al nmero que aparece a continuacin.   Por favor, tenga en cuenta que aunque hacemos todo lo posible para estar disponibles para asuntos urgentes fuera del horario de [place], no estamos disponibles las 24 horas del da, los 7 das de la [place].   Si tiene un problema urgente y no puede comunicarse con nosotros, puede optar por buscar atencin mdica  en el consultorio de su doctor(a), en una clnica privada, en un centro de atencin urgente o en una sala de emergencias.  Si tiene Engineering geologist, por favor llame inmediatamente al 911 o vaya a la sala de emergencias.  Nmeros de bper  - Dr. Nehemiah Massed: [phone]  - Dra. Moye: [phone]  - Dra. Nicole Kindred: [phone]  En caso de inclemencias del [place], por favor llame a Johnsie Kindred principal al [phone] para una actualizacin sobre el [place] de cualquier retraso o cierre.  Consejos para la medicacin en dermatologa: Por favor, guarde las cajas en las que vienen los medicamentos de uso tpico para ayudarle a seguir las instrucciones sobre dnde y cmo usarlos. Las farmacias generalmente imprimen las instrucciones del medicamento slo en las cajas y no directamente en los tubos del [place].   Si su medicamento es muy caro, por favor, pngase en contacto con Zigmund Daniel llamando al [phone] y presione la opcin 4 o envenos un mensaje a travs de Pharmacist, community.   No podemos decirle cul ser su copago por los medicamentos por adelantado ya que esto es diferente dependiendo de la cobertura de su seguro. Sin embargo, es posible que podamos encontrar un medicamento sustituto a Electrical engineer un formulario para que el seguro cubra el medicamento que se considera necesario.   Si se requiere una autorizacin previa para que su compaa de seguros [place] su medicamento, por favor permtanos de 1 a 2 das hbiles para completar este proceso.  Los  precios de los medicamentos varan con frecuencia dependiendo del Environmental consultant de dnde se surte la receta y alguna farmacias pueden ofrecer precios ms baratos.  El sitio web www.goodrx.com tiene cupones para medicamentos de Airline pilot. Los precios aqu no tienen en cuenta lo que podra costar con la ayuda del seguro (puede ser ms barato con su seguro), pero el sitio web puede darle el precio si no utiliz Research scientist (physical sciences).  - Puede imprimir el cupn correspondiente y llevarlo con su receta a la farmacia.  - Tambin puede pasar por nuestra oficina durante el horario de atencin regular y Charity fundraiser una tarjeta de cupones de GoodRx.  - Si necesita que su receta se enve electrnicamente a una farmacia diferente, informe a nuestra oficina a travs de MyChart de [place] o por telfono llamando al [phone] y presione la opcin 4.

## 2021-03-28 NOTE — Progress Notes (Signed)
° °  Follow-Up Visit   Subjective  Vanessa Walton is a 71 y.o. female who presents for the following: Follow-up (Patient here today for suture removal and to retreat SK vs lentigo at face. ).   The following portions of the chart were reviewed this encounter and updated as appropriate:       Review of Systems:  No other skin or systemic complaints except as noted in HPI or Assessment and Plan.  Objective  Well appearing patient in no apparent distress; mood and affect are within normal limits.  A focused examination was performed including back, face. Relevant physical exam findings are noted in the Assessment and Plan.  Left Lower Cheek 0.7 x 0.4 cm light tan macule    Assessment & Plan  Seborrheic keratosis Left Lower Cheek  Vs Lentigo  No charge - 2nd treatment for cosmetic SK  Destruction of lesion - Left Lower Cheek  Destruction method: cryotherapy   Informed consent: discussed and consent obtained   Lesion destroyed using liquid nitrogen: Yes   Region frozen until ice ball extended beyond lesion: Yes   Outcome: patient tolerated procedure well with no complications   Post-procedure details: wound care instructions given     Encounter for Removal of Sutures of Dysplastic Nevus excision - Incision site at the left lower back is clean, dry and intact - Wound cleansed, sutures removed, wound cleansed and steri strips applied.  - Discussed pathology results showing margins free  - Patient advised to keep steri-strips dry until they fall off. - Scars remodel for a full year. - Once steri-strips fall off, patient can apply over-the-counter silicone scar cream each night to help with scar remodeling if desired. - Patient advised to call with any concerns or if they notice any new or changing lesions.  Return for TBSE, as scheduled.  Graciella Belton, RMA, am acting as scribe for [name], MD .  Documentation: I have reviewed the above documentation for accuracy  and completeness, and I agree with the above.  [name] MD

## 2021-04-07 ENCOUNTER — Telehealth: Payer: Self-pay

## 2021-04-07 NOTE — Telephone Encounter (Signed)
I attempted to contact the patient, no answer. Left message for patient to call back concerning getting her scheduled for her Medicare Wellness Visit.

## 2021-05-24 ENCOUNTER — Other Ambulatory Visit: Payer: Self-pay

## 2021-05-24 ENCOUNTER — Encounter: Payer: Self-pay | Admitting: Family

## 2021-05-24 ENCOUNTER — Ambulatory Visit: Payer: Medicare PPO | Admitting: Family

## 2021-05-24 VITALS — BP 112/64 | HR 82 | Ht 59.0 in | Wt 132.0 lb

## 2021-05-24 DIAGNOSIS — H04123 Dry eye syndrome of bilateral lacrimal glands: Secondary | ICD-10-CM

## 2021-05-24 DIAGNOSIS — H903 Sensorineural hearing loss, bilateral: Secondary | ICD-10-CM

## 2021-05-24 DIAGNOSIS — L719 Rosacea, unspecified: Secondary | ICD-10-CM | POA: Diagnosis not present

## 2021-05-24 DIAGNOSIS — H8111 Benign paroxysmal vertigo, right ear: Secondary | ICD-10-CM

## 2021-05-24 DIAGNOSIS — H6981 Other specified disorders of Eustachian tube, right ear: Secondary | ICD-10-CM | POA: Diagnosis not present

## 2021-05-24 DIAGNOSIS — Z1231 Encounter for screening mammogram for malignant neoplasm of breast: Secondary | ICD-10-CM | POA: Insufficient documentation

## 2021-05-24 DIAGNOSIS — E7800 Pure hypercholesterolemia, unspecified: Secondary | ICD-10-CM

## 2021-05-24 MED ORDER — KETOCONAZOLE 2 % EX CREA: 1.0000 "application " | TOPICAL_CREAM | Freq: Every day | CUTANEOUS | 0 refills | Status: DC

## 2021-05-24 MED ORDER — ROSUVASTATIN CALCIUM 5 MG PO TABS: 5.0000 mg | ORAL_TABLET | Freq: Every evening | ORAL | 1 refills | Status: DC

## 2021-05-24 NOTE — Assessment & Plan Note (Signed)
Patient follows with ENT as necessary ?

## 2021-05-24 NOTE — Patient Instructions (Addendum)
After June 19th of this year you will be due for a colonoscopy.  ? ?Call [facility] [facility] to schedule your mammogram as I have sent the electronic order to their facility.  ?Here is their number : [phone]  ? ?It was a pleasure seeing you today! Please do not hesitate to reach out with any questions and or concerns. ? ?Regards,  ? ?Beyonce Sawatzky ?FNP-C ? ? ? ?

## 2021-05-24 NOTE — Assessment & Plan Note (Signed)
Continue medication management as prescribed by dermatology as well as follow-up with Derm as scheduled ?

## 2021-05-24 NOTE — Progress Notes (Signed)
Established Patient Office Visit  Subjective:  Patient ID: Vanessa Walton, female    DOB: 1951-02-20  Age: 71 y.o. MRN: [id number]  CC: No chief complaint on file.   HPI Vanessa Walton is here for a transition of care visit.  Prior provider was: Nicki Reaper, FNP  Pt is without acute concerns.   Colonoscopy with GI, due to repeat in three years.   chronic concerns:  BCC, rosacea: on soolantra from compounding pharmacy with dermatology Dr. Ardis Rowan)  Recurrent styes, chronic dry eyes: seen by opthamologist. Recurrent styes were in fact rosacea per pt. On xiidra for dry eyes.   Vitamin D def: taking 2000 IU once daily.   HSV2: gyn Lyndel Safe, prescribed for type 2. Not on suppression just as needed.   Chronic eustacian tube dysfunction: mainly experiences this when lying down.   BPV as well as meniere disease: pt states meniere dx was in 1983, no recent episode. Did have vertigo flare up last year not since. ENT Dr. Alessandra Bevels, audiologist Dr. Hiram Comber  Past Medical History:  Diagnosis Date   Arthritis    right index finger   Basal cell carcinoma 02/24/2020   L nasal dorsum, MOHs 04/15/20   Dysplastic nevus 12/27/2020   Spinal mid upper back, Severe atypia, excised 03/14/2021   Dysplastic nevus 12/27/2020   Left Lower Back, severe atypia, EXC 03/21/2021   GERD (gastroesophageal reflux disease)    Herpes simplex virus (HSV) infection    Hyperlipidemia    Meniere disease 02/13/2019   Meniere's disease     Past Surgical History:  Procedure Laterality Date   CESAREAN SECTION     COLONOSCOPY WITH PROPOFOL N/A 08/22/2017   Procedure: COLONOSCOPY WITH PROPOFOL;  Surgeon: Toney Reil, MD;  Location[facility];  Service: Endoscopy;  Laterality: N/A;   POLYPECTOMY  08/22/2017   Procedure: POLYPECTOMY;  Surgeon: Toney Reil, MD;  Location: [facility];  Service: Endoscopy;;   TONSILLECTOMY      Family History  Problem Relation Age  of Onset   Arthritis Mother    Hyperlipidemia Mother    Hyperlipidemia Father    Breast cancer Neg Hx     Social History   Socioeconomic History   Marital status: Married    Spouse name: Not on file   Number of children: 2   Years of education: Not on file   Highest education level: Not on file  Occupational History   Occupation: retired  Tobacco Use   Smoking status: Never   Smokeless tobacco: Never   Tobacco comments:    smoked socially as teenager  Vaping Use   Vaping Use: Never used  Substance and Sexual Activity   Alcohol use: Yes    Alcohol/week: 6.0 standard drinks    Types: 6 Glasses of wine per week    Comment: moderate   Drug use: No   Sexual activity: Yes    Partners: Male    Birth control/protection: None  Other Topics Concern   Not on file  Social History Narrative   Age 25 daughter np school   Age 4 female   Social Determinants of Health   Financial Resource Strain: Not on file  Food Insecurity: Not on file  Transportation Needs: Not on file  Physical Activity: Not on file  Stress: Not on file  Social Connections: Not on file  Intimate Partner Violence: Not on file    Outpatient Medications Prior to Visit  Medication Sig Dispense Refill  B Complex Vitamins (B COMPLEX PO) Take by mouth daily.     Biotin 54098 MCG TABS Take by mouth.     Cholecalciferol (VITAMIN D3) 50 MCG (2000 UT) TABS Take by mouth.     GLUCOSAMINE CHONDROITIN COMPLX PO Take by mouth daily.     hydrocortisone 2.5 % cream Apply to eyebrows 1-2 times a day as directed. 30 g 1   Ivermectin (SOOLANTRA) 1 % CREA Apply to face nightly. 45 g 2   Magnesium 400 MG TABS Take by mouth.     Omega-3 Fatty Acids (FISH OIL DOUBLE STRENGTH) 1200 MG CAPS Take by mouth 2 (two) times daily.     Probiotic Product (PROBIOTIC DAILY PO) Take by mouth.     valACYclovir (VALTREX) 1000 MG tablet Take 1 tablet (1,000 mg total) by mouth daily. For 5 days. May repeat if needed 30 tablet 2   vitamin E  400 UNIT capsule Take 400 Units by mouth daily.     XIIDRA 5 % SOLN      rosuvastatin (CRESTOR) 10 MG tablet TAKE 1 TABLET BY MOUTH EVERY DAY (Patient taking differently: 5 mg daily.) 90 tablet 0   rosuvastatin (CRESTOR) 5 MG tablet Take 5 mg by mouth at bedtime.     benzonatate (TESSALON) 100 MG capsule Take 1 capsule (100 mg total) by mouth 3 (three) times daily as needed. 30 capsule 0   doxycycline (PERIOSTAT) 20 MG tablet Take 2 by mouth with food daily (Patient not taking: Reported on 05/24/2021) 60 tablet 11   meloxicam (MOBIC) 15 MG tablet TAKE 1 TABLET BY MOUTH EVERY DAY (Patient not taking: Reported on 12/27/2020) 30 tablet 1   pimecrolimus (ELIDEL) 1 % cream Apply to eyebrows 1-2 times a day until improved. 30 g 1   No facility-administered medications prior to visit.    No Known Allergies  ROS Review of Systems  Review of Systems  Respiratory:  Negative for shortness of breath.   Cardiovascular:  Negative for chest pain and palpitations.  Gastrointestinal:  Negative for constipation and diarrhea.  Genitourinary:  Negative for dysuria, frequency and urgency.  Musculoskeletal:  Negative for myalgias.  Psychiatric/Behavioral:  Negative for depression and suicidal ideas.   All other systems reviewed and are negative.    Objective:    Physical Exam HENT:     Head: Normocephalic.     Right Ear: Tympanic membrane normal.     Left Ear: Tympanic membrane normal.     Nose: Nose normal.     Mouth/Throat:     Mouth: Mucous membranes are moist.  Eyes:     Pupils: Pupils are equal, round, and reactive to light.   Gen: NAD, resting comfortably CV: RRR with no murmurs appreciated Pulm: NWOB, CTAB with no crackles, wheezes, or rhonchi Skin: warm, dry Psych: Normal affect and thought content  BP 112/64   Pulse 82   Ht 4\' 11"  (1.499 m)   Wt 132 lb (59.9 kg)   SpO2 98%   BMI 26.66 kg/m  Wt Readings from Last 3 Encounters:  05/24/21 132 lb (59.9 kg)  05/28/20 130 lb (59  kg)  02/18/20 136 lb (61.7 kg)     Health Maintenance Due  Topic Date Due   Zoster Vaccines- Shingrix (1 of 2) Never done   COVID-19 Vaccine (5 - Booster for Pfizer series) 07/30/2020   COLONOSCOPY (Pts 45-61yrs Insurance coverage will need to be confirmed)  08/22/2020    There are no preventive care reminders to display  for this patient.  Lab Results  Component Value Date   TSH 1.50 05/28/2020   Lab Results  Component Value Date   WBC 6.7 05/28/2020   HGB 12.3 05/28/2020   HCT 36.6 05/28/2020   MCV 95.6 05/28/2020   PLT 193 05/28/2020   Lab Results  Component Value Date   NA 138 05/28/2020   K 4.5 05/28/2020   CO2 21 05/28/2020   GLUCOSE 76 05/28/2020   BUN 19 05/28/2020   CREATININE 0.75 05/28/2020   BILITOT 0.4 05/28/2020   ALKPHOS 37 (L) 02/13/2019   AST 40 (H) 05/28/2020   ALT 35 (H) 05/28/2020   PROT 6.9 05/28/2020   ALBUMIN 4.8 02/13/2019   CALCIUM 9.6 05/28/2020   GFR 78.01 02/13/2019   Lab Results  Component Value Date   CHOL 198 05/28/2020   Lab Results  Component Value Date   HDL 106 05/28/2020   Lab Results  Component Value Date   LDLCALC 74 05/28/2020   Lab Results  Component Value Date   TRIG 101 05/28/2020   Lab Results  Component Value Date   CHOLHDL 1.9 05/28/2020   No results found for: HGBA1C    Assessment & Plan:   Problem List Items Addressed This Visit       Nervous and Auditory   Sensorineural hearing loss of both ears    Continue f/u with audiologist as scheduled/as needed.      Benign paroxysmal positional vertigo of right ear    Stable last episode over 1 year ago.      Chronic Eustachian tube dysfunction, right    Patient follows with ENT as necessary        Musculoskeletal and Integument   Rosacea, unspecified    Continue medication management as prescribed by dermatology as well as follow-up with Derm as scheduled      Relevant Medications   ketoconazole (NIZORAL) 2 % cream     Other   HLD  (hyperlipidemia)    Continue Crestor 5 mg nightly prescription sent to pharmacy Work on low-cholesterol diet and exercise as tolerated      Relevant Medications   rosuvastatin (CRESTOR) 5 MG tablet   Chronically dry eyes, bilateral - Primary    Continue xiidra continue follow-up with ophthalmologist as scheduled      Encounter for screening mammogram for malignant neoplasm of breast    Mammogram ordered patient advised to call [facility] to schedule      Relevant Orders   MM 3D SCREEN BREAST BILATERAL    Meds ordered this encounter  Medications   ketoconazole (NIZORAL) 2 % cream    Sig: Apply 1 application. topically daily.    Dispense:  15 g    Refill:  0    Order Specific Question:   Supervising Provider    Answer:   BEDSOLE, AMY E [[id number]]   rosuvastatin (CRESTOR) 5 MG tablet    Sig: Take 1 tablet (5 mg total) by mouth at bedtime.    Dispense:  90 tablet    Refill:  1    Order Specific Question:   Supervising Provider    Answer:   Ermalene Searing, AMY E [[id number]]    Follow-up: Return in about 3 months (around 08/24/2021) for for annual wellness visit .    Mort Sawyers, FNP

## 2021-05-24 NOTE — Assessment & Plan Note (Signed)
Continue f/u with audiologist as scheduled/as needed. ?

## 2021-05-24 NOTE — Assessment & Plan Note (Signed)
Stable last episode over 1 year ago. ?

## 2021-05-24 NOTE — Assessment & Plan Note (Addendum)
Continue xiidra continue follow-up with ophthalmologist as scheduled ?

## 2021-05-24 NOTE — Assessment & Plan Note (Signed)
Mammogram ordered patient advised to call [facility] to schedule ?

## 2021-05-24 NOTE — Assessment & Plan Note (Signed)
Continue Crestor 5 mg nightly prescription sent to pharmacy ?Work on low-cholesterol diet and exercise as tolerated ?

## 2021-07-19 DIAGNOSIS — M9901 Segmental and somatic dysfunction of cervical region: Secondary | ICD-10-CM | POA: Diagnosis not present

## 2021-07-19 DIAGNOSIS — M9903 Segmental and somatic dysfunction of lumbar region: Secondary | ICD-10-CM | POA: Diagnosis not present

## 2021-07-19 DIAGNOSIS — M9905 Segmental and somatic dysfunction of pelvic region: Secondary | ICD-10-CM | POA: Diagnosis not present

## 2021-07-19 DIAGNOSIS — M9902 Segmental and somatic dysfunction of thoracic region: Secondary | ICD-10-CM | POA: Diagnosis not present

## 2021-08-04 ENCOUNTER — Ambulatory Visit
Admission: RE | Admit: 2021-08-04 | Discharge: 2021-08-04 | Disposition: A | Discharge status: Home / Self Care | Payer: Medicare PPO | Source: Ambulatory Visit | Attending: Family | Admitting: Family

## 2021-08-04 DIAGNOSIS — Z1231 Encounter for screening mammogram for malignant neoplasm of breast: Secondary | ICD-10-CM | POA: Diagnosis not present

## 2021-08-15 DIAGNOSIS — M9905 Segmental and somatic dysfunction of pelvic region: Secondary | ICD-10-CM | POA: Diagnosis not present

## 2021-08-15 DIAGNOSIS — M9901 Segmental and somatic dysfunction of cervical region: Secondary | ICD-10-CM | POA: Diagnosis not present

## 2021-08-15 DIAGNOSIS — M9903 Segmental and somatic dysfunction of lumbar region: Secondary | ICD-10-CM | POA: Diagnosis not present

## 2021-08-15 DIAGNOSIS — M9902 Segmental and somatic dysfunction of thoracic region: Secondary | ICD-10-CM | POA: Diagnosis not present

## 2021-09-08 DIAGNOSIS — M9901 Segmental and somatic dysfunction of cervical region: Secondary | ICD-10-CM | POA: Diagnosis not present

## 2021-09-08 DIAGNOSIS — M9902 Segmental and somatic dysfunction of thoracic region: Secondary | ICD-10-CM | POA: Diagnosis not present

## 2021-09-08 DIAGNOSIS — M9903 Segmental and somatic dysfunction of lumbar region: Secondary | ICD-10-CM | POA: Diagnosis not present

## 2021-09-08 DIAGNOSIS — M9905 Segmental and somatic dysfunction of pelvic region: Secondary | ICD-10-CM | POA: Diagnosis not present

## 2021-10-05 DIAGNOSIS — M9905 Segmental and somatic dysfunction of pelvic region: Secondary | ICD-10-CM | POA: Diagnosis not present

## 2021-10-05 DIAGNOSIS — M9901 Segmental and somatic dysfunction of cervical region: Secondary | ICD-10-CM | POA: Diagnosis not present

## 2021-10-05 DIAGNOSIS — M9902 Segmental and somatic dysfunction of thoracic region: Secondary | ICD-10-CM | POA: Diagnosis not present

## 2021-10-05 DIAGNOSIS — M9903 Segmental and somatic dysfunction of lumbar region: Secondary | ICD-10-CM | POA: Diagnosis not present

## 2021-10-14 ENCOUNTER — Other Ambulatory Visit: Payer: Medicare PPO

## 2021-10-19 DIAGNOSIS — M9901 Segmental and somatic dysfunction of cervical region: Secondary | ICD-10-CM | POA: Diagnosis not present

## 2021-10-19 DIAGNOSIS — M9903 Segmental and somatic dysfunction of lumbar region: Secondary | ICD-10-CM | POA: Diagnosis not present

## 2021-10-19 DIAGNOSIS — M9902 Segmental and somatic dysfunction of thoracic region: Secondary | ICD-10-CM | POA: Diagnosis not present

## 2021-10-19 DIAGNOSIS — M9905 Segmental and somatic dysfunction of pelvic region: Secondary | ICD-10-CM | POA: Diagnosis not present

## 2021-10-21 ENCOUNTER — Encounter: Payer: Self-pay | Admitting: Family

## 2021-10-21 ENCOUNTER — Ambulatory Visit (INDEPENDENT_AMBULATORY_CARE_PROVIDER_SITE_OTHER): Payer: Medicare PPO | Admitting: Family

## 2021-10-21 VITALS — BP 110/70 | HR 62 | Temp 98.6°F | Resp 16 | Ht 58.5 in | Wt 136.4 lb

## 2021-10-21 DIAGNOSIS — Z1211 Encounter for screening for malignant neoplasm of colon: Secondary | ICD-10-CM

## 2021-10-21 DIAGNOSIS — Z0000 Encounter for general adult medical examination without abnormal findings: Secondary | ICD-10-CM

## 2021-10-21 DIAGNOSIS — Z780 Asymptomatic menopausal state: Secondary | ICD-10-CM | POA: Diagnosis not present

## 2021-10-21 DIAGNOSIS — K635 Polyp of colon: Secondary | ICD-10-CM

## 2021-10-21 DIAGNOSIS — H8111 Benign paroxysmal vertigo, right ear: Secondary | ICD-10-CM

## 2021-10-21 DIAGNOSIS — R7989 Other specified abnormal findings of blood chemistry: Secondary | ICD-10-CM | POA: Diagnosis not present

## 2021-10-21 DIAGNOSIS — R768 Other specified abnormal immunological findings in serum: Secondary | ICD-10-CM | POA: Diagnosis not present

## 2021-10-21 DIAGNOSIS — E7800 Pure hypercholesterolemia, unspecified: Secondary | ICD-10-CM | POA: Diagnosis not present

## 2021-10-21 LAB — COMPREHENSIVE METABOLIC PANEL
ALT: 36 U/L — ABNORMAL HIGH (ref 0–35)
AST: 33 U/L (ref 0–37)
Albumin: 4.6 g/dL (ref 3.5–5.2)
Alkaline Phosphatase: 33 U/L — ABNORMAL LOW (ref 39–117)
BUN: 21 mg/dL (ref 6–23)
CO2: 23 mEq/L (ref 19–32)
Calcium: 9.7 mg/dL (ref 8.4–10.5)
Chloride: 103 mEq/L (ref 96–112)
Creatinine, Ser: 0.77 mg/dL (ref 0.40–1.20)
GFR: 77.92 mL/min (ref >60.00)
Glucose, Bld: 90 mg/dL (ref 70–99)
Potassium: 4.1 mEq/L (ref 3.5–5.1)
Sodium: 137 mEq/L (ref 135–145)
Total Bilirubin: 0.5 mg/dL (ref 0.2–1.2)
Total Protein: 7.4 g/dL (ref 6.0–8.3)

## 2021-10-21 LAB — LIPID PANEL
Cholesterol: 236 mg/dL — ABNORMAL HIGH (ref 0–200)
HDL: 83.1 mg/dL (ref >39.00)
LDL Cholesterol: 128 mg/dL — ABNORMAL HIGH (ref 0–99)
NonHDL: 152.77
Total CHOL/HDL Ratio: 3
Triglycerides: 126 mg/dL (ref 0.0–149.0)
VLDL: 25.2 mg/dL (ref 0.0–40.0)

## 2021-10-21 LAB — CBC WITH DIFFERENTIAL/PLATELET
Basophils Absolute: 0.1 10*3/uL (ref 0.0–0.1)
Basophils Relative: 1.2 % (ref 0.0–3.0)
Eosinophils Absolute: 0.1 10*3/uL (ref 0.0–0.7)
Eosinophils Relative: 2.3 % (ref 0.0–5.0)
HCT: 38.8 % (ref 36.0–46.0)
Hemoglobin: 12.9 g/dL (ref 12.0–15.0)
Lymphocytes Relative: 26.9 % (ref 12.0–46.0)
Lymphs Abs: 1.4 10*3/uL (ref 0.7–4.0)
MCHC: 33.4 g/dL (ref 30.0–36.0)
MCV: 96.2 fl (ref 78.0–100.0)
Monocytes Absolute: 0.5 10*3/uL (ref 0.1–1.0)
Monocytes Relative: 10.7 % (ref 3.0–12.0)
Neutro Abs: 3 10*3/uL (ref 1.4–7.7)
Neutrophils Relative %: 58.9 % (ref 43.0–77.0)
Platelets: 169 10*3/uL (ref 150.0–400.0)
RBC: 4.03 Mil/uL (ref 3.87–5.11)
RDW: 13.2 % (ref 11.5–15.5)
WBC: 5 10*3/uL (ref 4.0–10.5)

## 2021-10-21 NOTE — Progress Notes (Addendum)
Subjective:   Vanessa Walton is a 71 y.o. female who presents for annual CPE.   Colonoscopy 08/22/2017, due this year. Was told to repeat in three years.  Hearing was deferred as pt follows annually with audiologist also has chronic eustacian tube dysfunction so declines hearing.    Review of Systems    Review of Systems  Constitutional:  Negative for chills and fever.  HENT:  Negative for ear pain.   Eyes:  Negative for blurred vision.  Respiratory:  Negative for cough and shortness of breath.   Cardiovascular:  Negative for chest pain, palpitations and leg swelling.  Gastrointestinal:  Negative for abdominal pain, constipation and diarrhea.  Skin:  Negative for itching and rash.  Psychiatric/Behavioral:  Negative for substance abuse and suicidal ideas.            Objective:     Physical Exam Vitals reviewed.  Constitutional:      General: She is not in acute distress.    Appearance: Normal appearance. She is normal weight. She is not ill-appearing or toxic-appearing.  HENT:     Right Ear: Tympanic membrane normal.     Left Ear: Tympanic membrane normal.     Mouth/Throat:     Mouth: Mucous membranes are moist.     Pharynx: No pharyngeal swelling.     Tonsils: No tonsillar exudate.  Eyes:     Extraocular Movements: Extraocular movements intact.     Conjunctiva/sclera: Conjunctivae normal.     Pupils: Pupils are equal, round, and reactive to light.  Neck:     Thyroid: No thyroid mass.  Cardiovascular:     Rate and Rhythm: Normal rate and regular rhythm.  Pulmonary:     Effort: Pulmonary effort is normal.     Breath sounds: Normal breath sounds.  Abdominal:     General: Abdomen is flat. Bowel sounds are normal.     Palpations: Abdomen is soft.  Musculoskeletal:        General: Normal range of motion.  Lymphadenopathy:     Cervical:     Right cervical: No superficial cervical adenopathy.    Left cervical: No superficial cervical adenopathy.  Skin:    General:  Skin is warm.     Capillary Refill: Capillary refill takes less than 2 seconds.  Neurological:     General: No focal deficit present.     Mental Status: She is alert and oriented to person, place, and time.  Psychiatric:        Mood and Affect: Mood normal.        Behavior: Behavior normal.        Thought Content: Thought content normal.        Judgment: Judgment normal.     Today's Vitals   10/21/21 0947  BP: 110/70  Pulse: 62  Resp: 16  Temp: 98.6 F (37 C)  SpO2: 98%  Weight: 136 lb 6 oz (61.9 kg)  Height: 4' 10.5" (1.486 m)   Body mass index is 28.02 kg/m.     10/29/2021    1:39 PM 08/22/2017    7:46 AM  Advanced Directives  Does Patient Have a Medical Advance Directive? Yes Yes  Type of Paramedic of [place];Living will Living will  Does patient want to make changes to medical advance directive?  No - Patient declined    Current Medications (verified) Outpatient Encounter Medications as of 10/21/2021  Medication Sig   B Complex Vitamins (B COMPLEX PO) Take by mouth  daily.   Biotin 10000 MCG TABS Take by mouth.   Cholecalciferol (VITAMIN D3) 50 MCG (2000 UT) TABS Take by mouth.   GLUCOSAMINE CHONDROITIN COMPLX PO Take by mouth daily.   Ivermectin (SOOLANTRA) 1 % CREA Apply to face nightly.   Magnesium 400 MG TABS Take by mouth.   Omega-3 Fatty Acids (FISH OIL DOUBLE STRENGTH) 1200 MG CAPS Take by mouth 2 (two) times daily.   Probiotic Product (PROBIOTIC DAILY PO) Take by mouth.   valACYclovir (VALTREX) 1000 MG tablet Take 1 tablet (1,000 mg total) by mouth daily. For 5 days. May repeat if needed   vitamin E 400 UNIT capsule Take 400 Units by mouth daily.   XIIDRA 5 % SOLN    [DISCONTINUED] Azelastine HCl 137 MCG/SPRAY SOLN SMARTSIG:1-2 Spray(s) Both Nares Every 12 Hours   [DISCONTINUED] rosuvastatin (CRESTOR) 5 MG tablet Take 1 tablet (5 mg total) by mouth at bedtime.   [DISCONTINUED] hydrocortisone 2.5 % cream Apply to eyebrows 1-2 times  a day as directed.   [DISCONTINUED] ketoconazole (NIZORAL) 2 % cream Apply 1 application. topically daily. (Patient not taking: Reported on 10/21/2021)   No facility-administered encounter medications on file as of 10/21/2021.    Allergies (verified) Astelin [azelastine] and Flonase [fluticasone]   History: Past Medical History:  Diagnosis Date   Arthritis    right index finger   Basal cell carcinoma 02/24/2020   L nasal dorsum, MOHs 04/15/20   Dysplastic nevus 12/27/2020   Spinal mid upper back, Severe atypia, excised 03/14/2021   Dysplastic nevus 12/27/2020   Left Lower Back, severe atypia, EXC 03/21/2021   GERD (gastroesophageal reflux disease)    Herpes simplex virus (HSV) infection    Hyperlipidemia    Meniere disease 02/13/2019   Meniere's disease    Past Surgical History:  Procedure Laterality Date   CESAREAN SECTION     COLONOSCOPY WITH PROPOFOL N/A 08/22/2017   Procedure: COLONOSCOPY WITH PROPOFOL;  Surgeon: Lin Landsman, MD;  Location: [place];  Service: Endoscopy;  Laterality: N/A;   POLYPECTOMY  08/22/2017   Procedure: POLYPECTOMY;  Surgeon: Lin Landsman, MD;  Location: [place];  Service: Endoscopy;;   TONSILLECTOMY     Family History  Problem Relation Age of Onset   Arthritis Mother    Hyperlipidemia Mother    Hyperlipidemia Father    Breast cancer Neg Hx    Social History   Socioeconomic History   Marital status: Married    Spouse name: Not on file   Number of children: 2   Years of education: Not on file   Highest education level: Not on file  Occupational History   Occupation: retired    Fish farm manager: OTHER  Tobacco Use   Smoking status: Never   Smokeless tobacco: Never   Tobacco comments:    smoked socially as teenager  Vaping Use   Vaping Use: Never used  Substance and Sexual Activity   Alcohol use: Yes    Alcohol/week: 6.0 standard drinks of alcohol    Types: 6 Glasses of wine per week    Comment: moderate    Drug use: No   Sexual activity: Yes    Partners: Male    Birth control/protection: None  Other Topics Concern   Not on file  Social History Narrative   Age 65 daughter np school   Age 19 female   Social Determinants of Radio broadcast assistant Strain: Not on file  Food Insecurity: Not on file  Transportation  Needs: Not on file  Physical Activity: Not on file  Stress: Not on file  Social Connections: Not on file    Tobacco Counseling Counseling given: Not Answered Tobacco comments: smoked socially as teenager   Clinical Intake:                 Diabetic? N/a         Activities of Daily Living     No data to display          Patient Care Team: [name], FNP as PCP - General (Family Medicine)  Indicate any recent Medical Services you may have received from other than [facility] providers in the past year (date may be approximate).     Assessment:   This is a routine wellness examination for Vanessa Walton.  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 Pryor Ochoa   Dietary issues and exercise activities discussed:     Goals Addressed   None   Depression Screen    05/30/2020   11:17 AM 02/13/2019    9:41 AM 05/24/2017   11:51 AM 04/11/2016    3:25 PM 08/01/2013    2:16 PM  PHQ 2/9 Scores  PHQ - 2 Score 0 0 0 0 0    Fall Risk    10/21/2021    9:43 AM 02/13/2019    9:41 AM 04/11/2016    3:25 PM 08/01/2013    2:16 PM  Fall Risk   Falls in the past year? 0 0 No No  Number falls in past yr: 0     Injury with Fall? 0     Risk for fall due to : No Fall Risks       FALL RISK PREVENTION PERTAINING TO THE HOME:  Any stairs in or around the home? Yes  If so, are there any without handrails? No  Home free of loose throw rugs in walkways, pet beds, electrical cords, etc? No  Adequate lighting  in your home to reduce risk of falls? Yes   ASSISTIVE DEVICES UTILIZED TO PREVENT FALLS:  Life alert? No  Use of a cane, walker or w/c? No  Grab bars in the bathroom? Yes  Shower chair or bench in shower? Yes  Elevated toilet seat or a handicapped toilet? No   TIMED UP AND GO:  Was the test performed? Yes .  Length of time to ambulate 10 feet: <10 seconds sec.   Gait steady and fast with assistive device  Cognitive Function:        Immunizations Immunization History  Administered Date(s) Administered   Fluad Quad(high Dose 65+) 10/31/2018   Influenza, High Dose Seasonal PF 03/10/2016, 02/11/2018   Influenza,inj,Quad PF,6+ Mos 11/10/2014, 12/07/2016   Influenza-Unspecified 01/12/2020, 11/08/2021   PFIZER Comirnaty(Gray Top)Covid-19 Tri-Sucrose Vaccine 06/04/2020   PFIZER(Purple Top)SARS-COV-2 Vaccination 04/04/2019, 04/27/2019, 11/20/2019   Pfizer Covid-19 Vaccine Bivalent Booster 58yr & up 10/22/2021   Pneumococcal Conjugate-13 04/11/2016   Pneumococcal Polysaccharide-23 05/24/2017   Tdap 05/24/2017   Zoster Recombinat (Shingrix) 07/08/2021, 10/05/2021   Zoster, Live 04/08/2015    TDAP status: Up to date  Flu Vaccine status: Up to date  Pneumococcal vaccine status: Up to date  Covid-19 vaccine status: Declined, Education has been provided regarding the importance of this vaccine but patient still declined. Advised may receive this vaccine at local pharmacy  or Health Dept.or vaccine clinic. Aware to provide a copy of the vaccination record if obtained from local pharmacy or Health Dept. Verbalized acceptance and understanding.  Qualifies for Shingles Vaccine? No   Zostavax completed Yes   Shingrix Completed?: Yes  Screening Tests Health Maintenance  Topic Date Due   COLONOSCOPY (Pts 45-40yr Insurance coverage will need to be confirmed)  08/22/2020   COVID-19 Vaccine (6 - Pfizer risk series) 12/17/2021   MAMMOGRAM  08/05/2023   TETANUS/TDAP  05/25/2027    Pneumonia Vaccine 71 Years old  Completed   INFLUENZA VACCINE  Completed   DEXA SCAN  Completed   Hepatitis C Screening  Completed   Zoster Vaccines- Shingrix  Completed   HPV VACCINES  Aged Out    Health Maintenance  Health Maintenance Due  Topic Date Due   COLONOSCOPY (Pts 45-430yrInsurance coverage will need to be confirmed)  08/22/2020    Colorectal cancer screening: Referral to GI placed to Dr. VaMarius DitchPt aware the office will call re: appt.  Mammogram status: Completed 08/04/21. Repeat every year  Bone Density status: Ordered 10/21/2021, last completed 05/30/2016. Pt provided with contact info and advised to call to schedule appt.  Lung Cancer Screening: (Low Dose CT Chest recommended if Age 71-80ears, 30 pack-year currently smoking OR have quit w/in 15years.) does not qualify.   Lung Cancer Screening Referral: n/a  Additional Screening:  Hepatitis C Screening: does qualify; Completed 2018 however need repeat likely false positive Hepatitis C antibody positive, per pt no known active infection. Will repeat today.   Vision Screening: Recommended annual ophthalmology exams for early detection of glaucoma and other disorders of the eye. Is the patient up to date with their annual eye exam?  Yes  Who is the provider or what is the name of the office in which the patient attends annual eye exams? Dr. StScarlett Prestof pt is not established with a provider, would they like to be referred to a provider to establish care? No . N/a  Dental Screening: Recommended annual dental exams for proper oral hygiene, up to date. Goes every six months.   Community Resource Referral / Chronic Care Management: CRR required this visit?  No   CCM required this visit?  No        Assessment & Plan:   Problem List Items Addressed This Visit       Digestive   Polyp of colon    Referred to GI for colonoscopy.        Relevant Orders   Ambulatory referral to Gastroenterology      Nervous and Auditory   Benign paroxysmal positional vertigo of right ear    F/u prn with pcp and or ent        Other   HLD (hyperlipidemia)    Continue crestor 5 mg  Ordered lipid panel, pending results. Work on low cholesterol diet and exercise as tolerated       Relevant Orders   Lipid panel (Completed)   Screening for colon cancer    Referred to GI for colonoscopy.        Relevant Orders   Ambulatory referral to Gastroenterology   Positive hepatitis C antibody test    Questionable last lab work repat hep c rna and also hep c antbdy if again positive order u/s abdomen and also refer to GI      Relevant Orders   Hepatitis C antibody (Completed)   Hepatitis C RNA quantitative (Completed)   CBC  with Differential/Platelet (Completed)   Postmenopausal    Bone density ordered pending results.        Relevant Orders   DG Bone Density   Elevated liver function tests   Relevant Orders   Comprehensive metabolic panel (Completed)   US Abdomen Complete   Encounter for general adult medical examination without abnormal findings - Primary    Patient Counseling(The following topics were reviewed):  Preventative care handout given to pt  Health maintenance and immunizations reviewed. Please refer to Health maintenance section. Pt advised on safe sex, wearing seatbelts in car, and proper nutrition labwork ordered today for annual Dental health: Discussed importance of regular tooth brushing, flossing, and dental visits.         I have personally reviewed and noted the following in the patient's chart:   Medical and social history Use of alcohol, tobacco or illicit drugs  Current medications and supplements including opioid prescriptions. Patient is not currently taking opioid prescriptions. Functional ability and status Nutritional status Physical activity Advanced directives List of other physicians Hospitalizations, surgeries, and ER visits in previous 12  months Vitals Screenings to include cognitive, depression, and falls Referrals and appointments  In addition, I have reviewed and discussed with patient certain preventive protocols, quality metrics, and best practice recommendations. A written personalized care plan for preventive services as well as general preventive health recommendations were provided to patient. No orders of the defined types were placed in this encounter.   Follow-up: Return in about 6 months (around 04/23/2022) for regular follow up appointment .    [name], FNP

## 2021-10-21 NOTE — Patient Instructions (Addendum)
Mychart me the shingrix vaccination dates.   I have sent an electronic order over to your preferred location for the following:   '[]'$   2D Mammogram  '[]'$   3D Mammogram  '[x]'$   Bone Density   Please give this center a call to get scheduled at your convenience.  '[x]'$   [facility]  [place] [address]  [phone]  Make sure to wear two piece  clothing  No lotions powders or deodorants the day of the appointment Make sure to bring picture ID and insurance card.  Bring list of medications you are currently taking including any supplements.   Call Dr. Marius Ditch for your colonoscopy appointment to get scheduled.   Stop by the lab prior to leaving today. I will notify you of your results once received.   Call [facility] to get scheduled for an abdominal ultrasound to visualize the liver.   [place]: [facility]- enter through medical mall entrance [address], [place], [place] [address] [phone] 7 am to 5 pm   Due to recent changes in healthcare laws, you may see results of your imaging and/or laboratory studies on MyChart before I have had a chance to review them.  I understand that in some cases there may be results that are confusing or concerning to you. Please understand that not all results are received at the same time and often I may need to interpret multiple results in order to provide you with the best plan of care or course of treatment. Therefore, I ask that you please give me 2 business days to thoroughly review all your results before contacting my office for clarification. Should we see a critical lab result, you will be contacted sooner.   It was a pleasure seeing you today! Please do not hesitate to reach out with any questions and or concerns.  Regards,   [name] FNP-C

## 2021-10-22 ENCOUNTER — Encounter: Payer: Self-pay | Admitting: Family

## 2021-10-23 ENCOUNTER — Encounter: Payer: Self-pay | Admitting: Family

## 2021-10-24 ENCOUNTER — Telehealth: Payer: Self-pay

## 2021-10-24 ENCOUNTER — Other Ambulatory Visit: Payer: Self-pay

## 2021-10-24 DIAGNOSIS — Z86010 Personal history of colonic polyps: Secondary | ICD-10-CM

## 2021-10-24 LAB — HEPATITIS C ANTIBODY: Hepatitis C Ab: NONREACTIVE

## 2021-10-24 LAB — HEPATITIS C RNA QUANTITATIVE
HCV Quantitative Log: <1.18 log IU/mL — ABNORMAL HIGH
HCV RNA, PCR, QN: <15 IU/mL — ABNORMAL HIGH

## 2021-10-24 MED ORDER — PEG 3350-KCL-NA BICARB-NACL 420 G PO SOLR: 4000.0000 mL | Freq: Once | ORAL | 0 refills | Status: AC

## 2021-10-24 NOTE — Assessment & Plan Note (Signed)
Referred to GI for colonoscopy 

## 2021-10-24 NOTE — Telephone Encounter (Signed)
Gastroenterology Pre-Procedure Review  Request Date: 11/09/21 Requesting Physician: Dr. Marius Ditch  PATIENT REVIEW QUESTIONS: The patient responded to the following health history questions as indicated:    1. Are you having any GI issues? no 2. Do you have a personal history of Polyps? yes (08/22/17 colonoscopy performed by Dr. Marius Ditch 79m polyp was noted) 3. Do you have a family history of Colon Cancer or Polyps? no 4. Diabetes Mellitus? no 5. Joint replacements in the past 12 months?no 6. Major health problems in the past 3 months?no 7. Any artificial heart valves, MVP, or defibrillator?no    MEDICATIONS & ALLERGIES:    Patient reports the following regarding taking any anticoagulation/antiplatelet therapy:   Plavix, Coumadin, Eliquis, Xarelto, Lovenox, Pradaxa, Brilinta, or Effient? no Aspirin? no  Patient confirms/reports the following medications:  Current Outpatient Medications  Medication Sig Dispense Refill   B Complex Vitamins (B COMPLEX PO) Take by mouth daily.     Biotin 10000 MCG TABS Take by mouth.     Cholecalciferol (VITAMIN D3) 50 MCG (2000 UT) TABS Take by mouth.     GLUCOSAMINE CHONDROITIN COMPLX PO Take by mouth daily.     Ivermectin (SOOLANTRA) 1 % CREA Apply to face nightly. 45 g 2   Magnesium 400 MG TABS Take by mouth.     Omega-3 Fatty Acids (FISH OIL DOUBLE STRENGTH) 1200 MG CAPS Take by mouth 2 (two) times daily.     Probiotic Product (PROBIOTIC DAILY PO) Take by mouth.     rosuvastatin (CRESTOR) 5 MG tablet Take 1 tablet (5 mg total) by mouth at bedtime. 90 tablet 1   valACYclovir (VALTREX) 1000 MG tablet Take 1 tablet (1,000 mg total) by mouth daily. For 5 days. May repeat if needed 30 tablet 2   vitamin E 400 UNIT capsule Take 400 Units by mouth daily.     XIIDRA 5 % SOLN      No current facility-administered medications for this visit.    Patient confirms/reports the following allergies:  Allergies  Allergen Reactions   Astelin [Azelastine] Other (See  Comments)    Irritation to nose   Flonase [Fluticasone] Other (See Comments)    Due to chronic eustacian tube dysfunction    No orders of the defined types were placed in this encounter.   AUTHORIZATION INFORMATION Primary Insurance: 1D#: Group #:  Secondary Insurance: 1D#: Group #:  SCHEDULE INFORMATION: Date: 11/09/21 Time: Location: [facility]

## 2021-10-24 NOTE — Assessment & Plan Note (Signed)
F/u prn with pcp and or ent

## 2021-10-24 NOTE — Telephone Encounter (Signed)
Returned patients call to schedule her colonoscopy with Dr. Marius Ditch.  LVM for her to call back.  Thanks, [place], [place]

## 2021-10-24 NOTE — Assessment & Plan Note (Signed)
Continue crestor 5 mg  Ordered lipid panel, pending results. Work on low cholesterol diet and exercise as tolerated

## 2021-10-24 NOTE — Assessment & Plan Note (Signed)
Patient Counseling(The following topics were reviewed): ? Preventative care handout given to pt  ?Health maintenance and immunizations reviewed. Please refer to Health maintenance section. ?Pt advised on safe sex, wearing seatbelts in car, and proper nutrition ?labwork ordered today for annual ?Dental health: Discussed importance of regular tooth brushing, flossing, and dental visits. ? ? ?

## 2021-10-24 NOTE — Assessment & Plan Note (Signed)
Bone density ordered pending results.

## 2021-10-24 NOTE — Assessment & Plan Note (Signed)
Questionable last lab work repat hep c rna and also hep c antbdy if again positive order u/s abdomen and also refer to GI

## 2021-10-25 ENCOUNTER — Other Ambulatory Visit: Payer: Self-pay | Admitting: Family

## 2021-10-25 ENCOUNTER — Telehealth: Payer: Self-pay

## 2021-10-25 MED ORDER — ROSUVASTATIN CALCIUM 10 MG PO TABS: 10.0000 mg | ORAL_TABLET | Freq: Every evening | ORAL | 1 refills | Status: DC

## 2021-10-25 NOTE — Telephone Encounter (Signed)
Called [facility] got the information and updated in pt chart

## 2021-10-25 NOTE — Telephone Encounter (Signed)
Pt has requested to reschedule her colonoscopy from 09/06 to 12/09/21.  Trish in Endo has been informed of date change.  Thanks,  [place], [place]

## 2021-10-29 ENCOUNTER — Ambulatory Visit
Admission: EM | Admit: 2021-10-29 | Discharge: 2021-10-29 | Disposition: A | Discharge status: Home / Self Care | Payer: Medicare PPO | Attending: Emergency Medicine | Admitting: Emergency Medicine

## 2021-10-29 DIAGNOSIS — J0110 Acute frontal sinusitis, unspecified: Secondary | ICD-10-CM | POA: Diagnosis not present

## 2021-10-29 DIAGNOSIS — R051 Acute cough: Secondary | ICD-10-CM

## 2021-10-29 MED ORDER — AMOXICILLIN 875 MG PO TABS: 875.0000 mg | ORAL_TABLET | Freq: Two times a day (BID) | ORAL | 0 refills | Status: AC

## 2021-10-29 NOTE — ED Provider Notes (Signed)
[facility]    CSN: [id number] Arrival date & time: 10/29/21  1308      History   Chief Complaint Chief Complaint  Patient presents with   Cough    HPI Vanessa Walton is a 71 y.o. female.  Patient presents with sinus pressure, headache, congestion, postnasal drip, cough productive of clear sputum x10 days.  Treatment attempted with Delsym and ibuprofen.  She also reports diarrhea which has improved.  No fever, chills, shortness of breath, chest pain, abdominal pain, vomiting, or other symptoms.  Negative COVID test at home x2.  The history is provided by the patient and medical records.    Past Medical History:  Diagnosis Date   Arthritis    right index finger   Basal cell carcinoma 02/24/2020   L nasal dorsum, MOHs 04/15/20   Dysplastic nevus 12/27/2020   Spinal mid upper back, Severe atypia, excised 03/14/2021   Dysplastic nevus 12/27/2020   Left Lower Back, severe atypia, EXC 03/21/2021   GERD (gastroesophageal reflux disease)    Herpes simplex virus (HSV) infection    Hyperlipidemia    Meniere disease 02/13/2019   Meniere's disease     Patient Active Problem List   Diagnosis Date Noted   Polyp of colon 10/21/2021   Encounter for Medicare annual wellness exam 10/21/2021   Screening for colon cancer 10/21/2021   Positive hepatitis C antibody test 10/21/2021   Postmenopausal 10/21/2021   Elevated liver function tests 10/21/2021   Chronically dry eyes, 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   Rosacea, unspecified 05/24/2021   Chronic Eustachian tube dysfunction, right 05/24/2021   Benign paroxysmal positional vertigo of right ear 05/30/2020   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   HLD (hyperlipidemia) 08/01/2013    Past Surgical History:  Procedure Laterality Date   CESAREAN SECTION     COLONOSCOPY WITH PROPOFOL N/A 08/22/2017   Procedure: COLONOSCOPY WITH PROPOFOL;  Surgeon: Lin Landsman, MD;  Location:  [facility];  Service: Endoscopy;  Laterality: N/A;   POLYPECTOMY  08/22/2017   Procedure: POLYPECTOMY;  Surgeon: Lin Landsman, MD;  Location: [facility];  Service: Endoscopy;;   TONSILLECTOMY      OB History     Gravida  2   Para  2   Term  2   Preterm      AB      Living  2      SAB      IAB      Ectopic      Multiple      Live Births  2            Home Medications    Prior to Admission medications   Medication Sig Start Date End Date Taking? Authorizing Provider  amoxicillin (AMOXIL) 875 MG tablet Take 1 tablet (875 mg total) by mouth 2 (two) times daily for 10 days. 10/29/21 11/08/21 Yes Sharion Balloon, NP  B Complex Vitamins (B COMPLEX PO) Take by mouth daily.    [provider]  Biotin 10000 MCG TABS Take by mouth.    [provider]  Cholecalciferol (VITAMIN D3) 50 MCG (2000 UT) TABS Take by mouth.    [provider]  GLUCOSAMINE CHONDROITIN COMPLX PO Take by mouth daily.    [provider]  Ivermectin (SOOLANTRA) 1 % CREA Apply to face nightly. 02/24/20   [name], MD  Magnesium 400 MG TABS Take by mouth.  [provider]  Omega-3 Fatty Acids (FISH OIL DOUBLE STRENGTH) 1200 MG CAPS Take by mouth 2 (two) times daily.    [provider]  Probiotic Product (PROBIOTIC DAILY PO) Take by mouth.    [provider]  rosuvastatin (CRESTOR) 10 MG tablet Take 1 tablet (10 mg total) by mouth at bedtime. 10/25/21 04/23/22  [name], FNP  valACYclovir (VALTREX) 1000 MG tablet Take 1 tablet (1,000 mg total) by mouth daily. For 5 days. May repeat if needed 02/18/20   [name], MD  vitamin E 400 UNIT capsule Take 400 Units by mouth daily.    [provider]  Shirley Friar 5 % SOLN  10/27/19   [provider]    Family History Family History  Problem Relation Age of Onset   Arthritis Mother    Hyperlipidemia Mother    Hyperlipidemia Father    Breast  cancer Neg Hx     Social History Social History   Tobacco Use   Smoking status: Never   Smokeless tobacco: Never   Tobacco comments:    smoked socially as teenager  Vaping Use   Vaping Use: Never used  Substance Use Topics   Alcohol use: Yes    Alcohol/week: 6.0 standard drinks of alcohol    Types: 6 Glasses of wine per week    Comment: moderate   Drug use: No     Allergies   Astelin [azelastine] and Flonase [fluticasone]   Review of Systems Review of Systems  Constitutional:  Negative for chills and fever.  HENT:  Positive for congestion, postnasal drip and sinus pressure. Negative for ear pain and sore throat.   Respiratory:  Positive for cough. Negative for shortness of breath.   Cardiovascular:  Negative for chest pain and palpitations.  Gastrointestinal:  Positive for diarrhea. Negative for abdominal pain and vomiting.  Skin:  Negative for color change and rash.  All other systems reviewed and are negative.    Physical Exam Triage Vital Signs ED Triage Vitals  Enc Vitals Group     BP      Pulse      Resp      Temp      Temp src      SpO2      Weight      Height      Head Circumference      Peak Flow      Pain Score      Pain Loc      Pain Edu?      Excl. in [place]?    No data found.  Updated Vital Signs BP 138/78   Pulse 86   Temp 97.9 F (36.6 C)   Resp 17   Ht 4' 10.5" (1.486 m)   Wt 135 lb (61.2 kg)   SpO2 97%   BMI 27.73 kg/m   Visual Acuity Right Eye Distance:   Left Eye Distance:   Bilateral Distance:    Right Eye Near:   Left Eye Near:    Bilateral Near:     Physical Exam Vitals and nursing note reviewed.  Constitutional:      General: She is not in acute distress.    Appearance: Normal appearance. She is well-developed. She is not ill-appearing.  HENT:     Right Ear: Tympanic membrane normal.     Left Ear: Tympanic membrane normal.     Nose: Congestion and rhinorrhea present.     Mouth/Throat:     Mouth:  Mucous  membranes are moist.     Pharynx: Oropharynx is clear.  Cardiovascular:     Rate and Rhythm: Normal rate and regular rhythm.     Heart sounds: Normal heart sounds.  Pulmonary:     Effort: Pulmonary effort is normal. No respiratory distress.     Breath sounds: Normal breath sounds.  Musculoskeletal:     Cervical back: Neck supple.  Skin:    General: Skin is warm and dry.  Neurological:     Mental Status: She is alert.  Psychiatric:        Mood and Affect: Mood normal.        Behavior: Behavior normal.      UC Treatments / Results  Labs (all labs ordered are listed, but only abnormal results are displayed) Labs Reviewed - No data to display  EKG   Radiology No results found.  Procedures Procedures (including critical care time)  Medications Ordered in UC Medications - No data to display  Initial Impression / Assessment and Plan / UC Course  I have reviewed the triage vital signs and the nursing notes.  Pertinent labs & imaging results that were available during my care of the patient were reviewed by me and considered in my medical decision making (see chart for details).   Acute sinusitis, cough.  Patient has been symptomatic for 10 days and is not improving with OTC treatment.  Afebrile and vital signs are stable.  Treating with amoxicillin.  Discussed symptomatic care including Tylenol or ibuprofen as needed.  Instructed patient to follow up with her PCP if her symptoms are not improving.  She agrees to plan of care.     Final Clinical Impressions(s) / UC Diagnoses   Final diagnoses:  Acute non-recurrent frontal sinusitis  Acute cough     Discharge Instructions      Take the amoxicillin as directed.  Follow up with your primary care provider if your symptoms are not improving.        ED Prescriptions     Medication Sig Dispense Auth. Provider   amoxicillin (AMOXIL) 875 MG tablet Take 1 tablet (875 mg total) by mouth 2 (two) times daily for 10 days.  20 tablet Sharion Balloon, NP      PDMP not reviewed this encounter.   Sharion Balloon, NP 10/29/21 [phone]

## 2021-10-29 NOTE — Discharge Instructions (Addendum)
Take the amoxicillin as directed.  Follow up with your primary care provider if your symptoms are not improving.   ° ° °

## 2021-10-29 NOTE — ED Triage Notes (Signed)
Patient to Urgent Care with complaints of cough, congestion and drainage, sinus pain. Symptoms started approx 10 days ago. Clear sputum productive with cough.   Reports recently receiving the RSV shot and covid booster and since has felt weak and drained. Reports some diarrhea after that has now stopped.   Multiple negative covid tests.

## 2021-11-02 ENCOUNTER — Telehealth: Payer: Medicare PPO | Admitting: Family Medicine

## 2021-11-02 DIAGNOSIS — R053 Chronic cough: Secondary | ICD-10-CM

## 2021-11-02 MED ORDER — BENZONATATE 100 MG PO CAPS: 100.0000 mg | ORAL_CAPSULE | Freq: Two times a day (BID) | ORAL | 0 refills | Status: DC | PRN

## 2021-11-02 MED ORDER — PREDNISONE 20 MG PO TABS: 40.0000 mg | ORAL_TABLET | Freq: Every day | ORAL | 0 refills | Status: AC

## 2021-11-02 NOTE — Patient Instructions (Signed)
Vanessa Walton, thank you for joining Vanessa Mayo, NP for today's virtual visit.  While this provider is not your primary care provider (PCP), if your PCP is located in our provider database this encounter information will be shared with them immediately following your visit.  Consent: (Patient) Vanessa Walton provided verbal consent for this virtual visit at the beginning of the encounter.  Current Medications:  Current Outpatient Medications:    benzonatate (TESSALON) 100 MG capsule, Take 1 capsule (100 mg total) by mouth 2 (two) times daily as needed for cough., Disp: 20 capsule, Rfl: 0   predniSONE (DELTASONE) 20 MG tablet, Take 2 tablets (40 mg total) by mouth daily with breakfast for 3 days., Disp: 6 tablet, Rfl: 0   amoxicillin (AMOXIL) 875 MG tablet, Take 1 tablet (875 mg total) by mouth 2 (two) times daily for 10 days., Disp: 20 tablet, Rfl: 0   B Complex Vitamins (B COMPLEX PO), Take by mouth daily., Disp: , Rfl:    Biotin 10000 MCG TABS, Take by mouth., Disp: , Rfl:    Cholecalciferol (VITAMIN D3) 50 MCG (2000 UT) TABS, Take by mouth., Disp: , Rfl:    GLUCOSAMINE CHONDROITIN COMPLX PO, Take by mouth daily., Disp: , Rfl:    Ivermectin (SOOLANTRA) 1 % CREA, Apply to face nightly., Disp: 45 g, Rfl: 2   Magnesium 400 MG TABS, Take by mouth., Disp: , Rfl:    Omega-3 Fatty Acids (FISH OIL DOUBLE STRENGTH) 1200 MG CAPS, Take by mouth 2 (two) times daily., Disp: , Rfl:    Probiotic Product (PROBIOTIC DAILY PO), Take by mouth., Disp: , Rfl:    rosuvastatin (CRESTOR) 10 MG tablet, Take 1 tablet (10 mg total) by mouth at bedtime., Disp: 90 tablet, Rfl: 1   valACYclovir (VALTREX) 1000 MG tablet, Take 1 tablet (1,000 mg total) by mouth daily. For 5 days. May repeat if needed, Disp: 30 tablet, Rfl: 2   vitamin E 400 UNIT capsule, Take 400 Units by mouth daily., Disp: , Rfl:    XIIDRA 5 % SOLN, , Disp: , Rfl:    Medications ordered in this encounter:  Meds ordered this encounter  Medications    predniSONE (DELTASONE) 20 MG tablet    Sig: Take 2 tablets (40 mg total) by mouth daily with breakfast for 3 days.    Dispense:  6 tablet    Refill:  0    Order Specific Question:   Supervising Provider    Answer:   MILLER, BRIAN [[id number]]   benzonatate (TESSALON) 100 MG capsule    Sig: Take 1 capsule (100 mg total) by mouth 2 (two) times daily as needed for cough.    Dispense:  20 capsule    Refill:  0    Order Specific Question:   Supervising Provider    Answer:   Sabra Heck, [place]     *If you need refills on other medications prior to your next appointment, please contact your pharmacy*  Follow-Up: Call back or seek an in-person evaluation if the symptoms worsen or if the condition fails to improve as anticipated.  Other Instructions -rest -hydrate -take medication as ordered  Have a great trip!   If you have been instructed to have an in-person evaluation today at a local Urgent Care facility, please use the link below. It will take you to a list of all of our available [facility], including address, phone number and hours of operation. Please do not delay care.  [facility]  If you or a family member do not have a primary care provider, use the link below to schedule a visit and establish care. When you choose a [facility] primary care physician or advanced practice provider, you gain a long-term partner in health. Find a Primary Care Provider  Learn more about [facility]'s in-office and virtual care options: [facility]

## 2021-11-02 NOTE — Progress Notes (Signed)
Virtual Visit Consent   Vanessa Walton, you are scheduled for a virtual visit with a [place] provider today. Just as with appointments in the office, your consent must be obtained to participate. Your consent will be active for this visit and any virtual visit you may have with one of our providers in the next 365 days. If you have a MyChart account, a copy of this consent can be sent to you electronically.  As this is a virtual visit, video technology does not allow for your provider to perform a traditional examination. This may limit your provider's ability to fully assess your condition. If your provider identifies any concerns that need to be evaluated in person or the need to arrange testing (such as labs, EKG, etc.), we will make arrangements to do so. Although advances in technology are sophisticated, we cannot ensure that it will always work on either your end or our end. If the connection with a video visit is poor, the visit may have to be switched to a telephone visit. With either a video or telephone visit, we are not always able to ensure that we have a secure connection.  By engaging in this virtual visit, you consent to the provision of healthcare and authorize for your insurance to be billed (if applicable) for the services provided during this visit. Depending on your insurance coverage, you may receive a charge related to this service.  I need to obtain your verbal consent now. Are you willing to proceed with your visit today? Evan Osburn has provided verbal consent on 11/02/2021 for a virtual visit (video or telephone). Perlie Mayo, NP  Date: 11/02/2021 2:17 PM  Virtual Visit via Video Note   I, Perlie Mayo, connected with  Liat Mayol  ([id number], 10-Mar-1950) on 11/02/21 at  2:15 PM EDT by a video-enabled telemedicine application and verified that I am speaking with the correct person using two identifiers.  Location: Patient: Virtual Visit Location Patient:  Home Provider: Virtual Visit Location Provider: Home Office   I discussed the limitations of evaluation and management by telemedicine and the availability of in person appointments. The patient expressed understanding and agreed to proceed.    History of Present Illness: Vanessa Walton is a 71 y.o. who identifies as a female who was assigned female at birth, and is being seen today for persistent cough that is not responding well to amoxicillin and perles. Was seen for cough on 8/26 and Dx with sinus infection. Reports mildly feeling better, but cough is still very persistent- almost out of perles. Denies any worsening of previous symptoms, only linger cough.  Was COVID neg x 2 on home test. Denies fevers, chills, chest pain or shortness of breath. No other new symptoms to report. Reports upcoming travel in Sept wants to be better by then.   .  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   HLD (hyperlipidemia) 08/01/2013    Allergies:  Allergies  Allergen Reactions  Astelin [Azelastine] Other (See Comments)    Irritation to nose   Flonase [Fluticasone] Other (See Comments)    Due to chronic eustacian tube dysfunction   Medications:  Current Outpatient Medications:    amoxicillin (AMOXIL) 875 MG tablet, Take 1 tablet (875 mg total) by mouth 2 (two) times daily for 10 days., Disp: 20 tablet, Rfl: 0   B Complex Vitamins (B COMPLEX  PO), Take by mouth daily., Disp: , Rfl:    Biotin 10000 MCG TABS, Take by mouth., Disp: , Rfl:    Cholecalciferol (VITAMIN D3) 50 MCG (2000 UT) TABS, Take by mouth., Disp: , Rfl:    GLUCOSAMINE CHONDROITIN COMPLX PO, Take by mouth daily., Disp: , Rfl:    Ivermectin (SOOLANTRA) 1 % CREA, Apply to face nightly., Disp: 45 g, Rfl: 2   Magnesium 400 MG TABS, Take by mouth., Disp: , Rfl:    Omega-3 Fatty Acids (FISH OIL DOUBLE STRENGTH) 1200 MG CAPS, Take by mouth 2 (two) times daily., Disp: , Rfl:    Probiotic Product (PROBIOTIC DAILY PO), Take by mouth., Disp: , Rfl:    rosuvastatin (CRESTOR) 10 MG tablet, Take 1 tablet (10 mg total) by mouth at bedtime., Disp: 90 tablet, Rfl: 1   valACYclovir (VALTREX) 1000 MG tablet, Take 1 tablet (1,000 mg total) by mouth daily. For 5 days. May repeat if needed, Disp: 30 tablet, Rfl: 2   vitamin E 400 UNIT capsule, Take 400 Units by mouth daily., Disp: , Rfl:    XIIDRA 5 % SOLN, , Disp: , Rfl:   Observations/Objective: Patient is well-developed, well-nourished in no acute distress.  Resting comfortably  at home.  Head is normocephalic, atraumatic.  No labored breathing.  Speech is clear and coherent with logical content.  Patient is alert and oriented at baseline.  Dry cough  Assessment and Plan:  1. Persistent cough  - predniSONE (DELTASONE) 20 MG tablet; Take 2 tablets (40 mg total) by mouth daily with breakfast for 3 days.  Dispense: 6 tablet; Refill: 0 - benzonatate (TESSALON) 100 MG capsule; Take 1 capsule (100 mg total) by mouth 2 (two) times daily as needed for cough.  Dispense: 20 capsule; Refill: 0  -linger cough  -short burst of pred and perle refill  Advised to continue Amoxicillin and follow up if not improved or worsen.   Reviewed side effects, risks and benefits of medication.    Patient acknowledged agreement and understanding of the plan.   Past Medical, Surgical, Social History, Allergies, and Medications have been  Reviewed.   Follow Up Instructions: I discussed the assessment and treatment plan with the patient. The patient was provided an opportunity to ask questions and all were answered. The patient agreed with the plan and demonstrated an understanding of the instructions.  A copy of instructions were sent to the patient via MyChart unless otherwise noted below.    The patient was advised to call back or seek an in-person evaluation if the symptoms worsen or if the condition fails to improve as anticipated.  Time:  I spent 10 minutes with the patient via telehealth technology discussing the above problems/concerns.    Perlie Mayo, NP

## 2021-11-08 DIAGNOSIS — M9902 Segmental and somatic dysfunction of thoracic region: Secondary | ICD-10-CM | POA: Diagnosis not present

## 2021-11-08 DIAGNOSIS — M9903 Segmental and somatic dysfunction of lumbar region: Secondary | ICD-10-CM | POA: Diagnosis not present

## 2021-11-08 DIAGNOSIS — M9905 Segmental and somatic dysfunction of pelvic region: Secondary | ICD-10-CM | POA: Diagnosis not present

## 2021-11-08 DIAGNOSIS — M9901 Segmental and somatic dysfunction of cervical region: Secondary | ICD-10-CM | POA: Diagnosis not present

## 2021-11-09 ENCOUNTER — Encounter: Payer: Self-pay | Admitting: Family

## 2021-11-14 DIAGNOSIS — Z0000 Encounter for general adult medical examination without abnormal findings: Secondary | ICD-10-CM | POA: Insufficient documentation

## 2021-11-14 NOTE — Addendum Note (Signed)
Addended by: [name] on: 11/14/2021 09:40 AM   Modules accepted: Level of Service

## 2021-11-14 NOTE — Assessment & Plan Note (Signed)
Patient Counseling(The following topics were reviewed): ? Preventative care handout given to pt  ?Health maintenance and immunizations reviewed. Please refer to Health maintenance section. ?Pt advised on safe sex, wearing seatbelts in car, and proper nutrition ?labwork ordered today for annual ?Dental health: Discussed importance of regular tooth brushing, flossing, and dental visits. ? ? ?

## 2021-12-05 ENCOUNTER — Ambulatory Visit
Admission: EM | Admit: 2021-12-05 | Discharge: 2021-12-05 | Disposition: A | Discharge status: Home / Self Care | Payer: Medicare PPO | Attending: Emergency Medicine | Admitting: Emergency Medicine

## 2021-12-05 ENCOUNTER — Ambulatory Visit (INDEPENDENT_AMBULATORY_CARE_PROVIDER_SITE_OTHER): Payer: Medicare PPO

## 2021-12-05 ENCOUNTER — Other Ambulatory Visit: Payer: Medicare PPO

## 2021-12-05 DIAGNOSIS — R0602 Shortness of breath: Secondary | ICD-10-CM | POA: Diagnosis not present

## 2021-12-05 DIAGNOSIS — R058 Other specified cough: Secondary | ICD-10-CM

## 2021-12-05 DIAGNOSIS — R059 Cough, unspecified: Secondary | ICD-10-CM | POA: Diagnosis not present

## 2021-12-05 DIAGNOSIS — J209 Acute bronchitis, unspecified: Secondary | ICD-10-CM

## 2021-12-05 MED ORDER — PREDNISONE 10 MG PO TABS: 40.0000 mg | ORAL_TABLET | Freq: Every day | ORAL | 0 refills | Status: AC

## 2021-12-05 MED ORDER — AZITHROMYCIN 250 MG PO TABS: 250.0000 mg | ORAL_TABLET | Freq: Every day | ORAL | 0 refills | Status: DC

## 2021-12-05 NOTE — ED Provider Notes (Signed)
[facility]    CSN: [id number] Arrival date & time: 12/05/21  1131      History   Chief Complaint Chief Complaint  Patient presents with   Cough   Headache    HPI Vanessa Walton is a 71 y.o. female.  Patient just returned from 3 week trip to [place].  She has ongoing congestion and cough.  The cough is productive of yellow sputum and she has some shortness of breath x 4 days.  She bought an albuterol inhaler in [place] 4 days ago; last used yesterday evening.  No fever, sore throat, chest pain, vomiting, diarrhea, or other symptoms.  Patient was seen here on 10/29/2021; diagnosed with acute sinusitis and acute cough; treated with amoxicillin and Tessalon Perles.  She had a video visit with 11/02/2021 for persistent cough and was treated with Tessalon Perles and prednisone.  Her medical history includes hyperlipidemia, GERD, arthritis, Meniere's disease, basal cell carcinoma.    The history is provided by the patient and medical records.    Past Medical History:  Diagnosis Date   Arthritis    right index finger   Basal cell carcinoma 02/24/2020   L nasal dorsum, MOHs 04/15/20   Dysplastic nevus 12/27/2020   Spinal mid upper back, Severe atypia, excised 03/14/2021   Dysplastic nevus 12/27/2020   Left Lower Back, severe atypia, EXC 03/21/2021   GERD (gastroesophageal reflux disease)    Herpes simplex virus (HSV) infection    Hyperlipidemia    Meniere disease 02/13/2019   Meniere's disease     Patient Active Problem List   Diagnosis Date Noted   Encounter for general adult medical examination without abnormal findings 11/14/2021   Polyp of colon 10/21/2021   Screening for colon cancer 10/21/2021   Positive hepatitis C antibody test 10/21/2021   Postmenopausal 10/21/2021   Elevated liver function tests 10/21/2021   Chronically dry eyes, 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   Rosacea, unspecified 05/24/2021   Chronic Eustachian tube dysfunction, right 05/24/2021   Benign paroxysmal  positional vertigo of right ear 05/30/2020   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   HLD (hyperlipidemia) 08/01/2013    Past Surgical History:  Procedure Laterality Date   CESAREAN SECTION     COLONOSCOPY WITH PROPOFOL N/A 08/22/2017   Procedure: COLONOSCOPY WITH PROPOFOL;  Surgeon: Lin Landsman, MD;  Location: [place];  Service: Endoscopy;  Laterality: N/A;   POLYPECTOMY  08/22/2017   Procedure: POLYPECTOMY;  Surgeon: Lin Landsman, MD;  Location: [place];  Service: Endoscopy;;   TONSILLECTOMY      OB History     Gravida  2   Para  2   Term  2   Preterm      AB      Living  2      SAB      IAB      Ectopic      Multiple      Live Births  2            Home Medications    Prior to Admission medications   Medication Sig Start Date End Date Taking? Authorizing Provider  azithromycin (ZITHROMAX) 250 MG tablet Take 1 tablet (250 mg total) by mouth daily. Take first 2 tablets together, then 1 every day until finished. 12/05/21  Yes Sharion Balloon, NP  predniSONE (DELTASONE) 10 MG tablet Take 4 tablets (40 mg total) by mouth daily for 5 days.  12/05/21 12/10/21 Yes Sharion Balloon, NP  B Complex Vitamins (B COMPLEX PO) Take by mouth daily.    [provider]  benzonatate (TESSALON) 100 MG capsule Take 1 capsule (100 mg total) by mouth 2 (two) times daily as needed for cough. 11/02/21   Perlie Mayo, NP  Biotin 10000 MCG TABS Take by mouth.    [provider]  Cholecalciferol (VITAMIN D3) 50 MCG (2000 UT) TABS Take by mouth.    [provider]  GLUCOSAMINE CHONDROITIN COMPLX PO Take by mouth daily.    [provider]  Ivermectin (SOOLANTRA) 1 % CREA Apply to face nightly. 02/24/20   [name], MD  Magnesium 400 MG TABS Take by mouth.    [provider]  Omega-3 Fatty Acids (FISH OIL DOUBLE STRENGTH) 1200 MG CAPS Take by mouth 2  (two) times daily.    [provider]  Probiotic Product (PROBIOTIC DAILY PO) Take by mouth.    [provider]  rosuvastatin (CRESTOR) 10 MG tablet Take 1 tablet (10 mg total) by mouth at bedtime. 10/25/21 04/23/22  [name], FNP  valACYclovir (VALTREX) 1000 MG tablet Take 1 tablet (1,000 mg total) by mouth daily. For 5 days. May repeat if needed 02/18/20   [name], MD  vitamin E 400 UNIT capsule Take 400 Units by mouth daily.    [provider]  Shirley Friar 5 % SOLN  10/27/19   [provider]    Family History Family History  Problem Relation Age of Onset   Arthritis Mother    Hyperlipidemia Mother    Hyperlipidemia Father    Breast cancer Neg Hx     Social History Social History   Tobacco Use   Smoking status: Never   Smokeless tobacco: Never   Tobacco comments:    smoked socially as teenager  Vaping Use   Vaping Use: Never used  Substance Use Topics   Alcohol use: Yes    Alcohol/week: 6.0 standard drinks of alcohol    Types: 6 Glasses of wine per week    Comment: moderate   Drug use: No     Allergies   Astelin [azelastine] and Flonase [fluticasone]   Review of Systems Review of Systems  Constitutional:  Negative for chills and fever.  HENT:  Positive for congestion. Negative for ear pain and sore throat.   Respiratory:  Positive for cough and shortness of breath.   Cardiovascular:  Negative for chest pain and palpitations.  Gastrointestinal:  Negative for diarrhea and vomiting.  Skin:  Negative for color change and rash.  All other systems reviewed and are negative.    Physical Exam Triage Vital Signs ED Triage Vitals [12/05/21 1140]  Enc Vitals Group     BP      Pulse      Resp      Temp      Temp src      SpO2      Weight 135 lb (61.2 kg)     Height 4' 10.5" (1.486 m)     Head Circumference      Peak Flow      Pain Score      Pain Loc      Pain Edu?      Excl. in [place]?    No data  found.  Updated Vital Signs BP 125/76   Pulse 75   Temp 98.1 F (36.7 C)   Resp 18   Ht 4' 10.5" (1.486  m)   Wt 135 lb (61.2 kg)   SpO2 97%   BMI 27.73 kg/m   Visual Acuity Right Eye Distance:   Left Eye Distance:   Bilateral Distance:    Right Eye Near:   Left Eye Near:    Bilateral Near:     Physical Exam Vitals and nursing note reviewed.  Constitutional:      General: She is not in acute distress.    Appearance: Normal appearance. She is well-developed. She is not ill-appearing.  HENT:     Right Ear: Tympanic membrane normal.     Left Ear: Tympanic membrane normal.     Nose: Nose normal.     Mouth/Throat:     Mouth: Mucous membranes are moist.     Pharynx: Oropharynx is clear.     Comments: Clear PND.  Cardiovascular:     Rate and Rhythm: Normal rate and regular rhythm.     Heart sounds: Normal heart sounds.  Pulmonary:     Effort: Pulmonary effort is normal. No respiratory distress.     Breath sounds: Rhonchi present.  Musculoskeletal:     Cervical back: Neck supple.  Skin:    General: Skin is warm and dry.  Neurological:     Mental Status: She is alert.  Psychiatric:        Mood and Affect: Mood normal.        Behavior: Behavior normal.      UC Treatments / Results  Labs (all labs ordered are listed, but only abnormal results are displayed) Labs Reviewed - No data to display  EKG   Radiology DG Chest 2 View  Result Date: 12/05/2021 CLINICAL DATA:  Productive cough.  Recent international travel. EXAM: CHEST - 2 VIEW COMPARISON:  None Available. FINDINGS: The heart size and mediastinal contours are within normal limits. Both lungs are clear. No pleural effusion or pneumothorax. Mild multilevel degenerative changes in the thoracic spine. IMPRESSION: No acute cardiopulmonary abnormality. Electronically Signed   By: Ileana Roup M.D.   On: 12/05/2021 12:06    Procedures Procedures (including critical care time)  Medications Ordered in  UC Medications - No data to display  Initial Impression / Assessment and Plan / UC Course  I have reviewed the triage vital signs and the nursing notes.  Pertinent labs & imaging results that were available during my care of the patient were reviewed by me and considered in my medical decision making (see chart for details).   Productive cough, shortness of breath, Acute bronchitis.  CXR negative.  Patient returned from a trip to [place] yesterday.  She purchased an albuterol inhaler while in [place].  Instructed her to continue using the inhaler as directed.  Also treating today with Zithromax and prednisone.  Instructed patient to follow-up with her PCP for recheck later this week.  ED precautions discussed for shortness of breath or other concerning symptoms.  Education provided on cough, shortness of breath, acute bronchitis.  Patient agrees to plan of care.   Final Clinical Impressions(s) / UC Diagnoses   Final diagnoses:  Productive cough  Shortness of breath  Acute bronchitis, unspecified organism     Discharge Instructions      Take the Zithromax and prednisone as directed.  Continue using the albuterol inhaler as directed.  Follow up with your primary care provider this week.        ED Prescriptions     Medication Sig Dispense Auth. Provider   azithromycin (ZITHROMAX) 250 MG tablet Take  1 tablet (250 mg total) by mouth daily. Take first 2 tablets together, then 1 every day until finished. 6 tablet Sharion Balloon, NP   predniSONE (DELTASONE) 10 MG tablet Take 4 tablets (40 mg total) by mouth daily for 5 days. 20 tablet Sharion Balloon, NP      PDMP not reviewed this encounter.   Sharion Balloon, NP 12/05/21 [phone]

## 2021-12-05 NOTE — Discharge Instructions (Addendum)
Take the Zithromax and prednisone as directed.  Continue using the albuterol inhaler as directed.  Follow up with your primary care provider this week.

## 2021-12-05 NOTE — ED Triage Notes (Signed)
Patient to Urgent Care with complaints of  cough, congestion, and headache.   Reports cough stated august 24th, was seen here and has been taking the prescribed tessalon. Patient then completed a 3-day steroid prescription.   Patient returned from [place] last night and is still feeling congestion and coughing/ feeling SHOB. Used albuterol last night. Difficulty sleeping. Cough is productive- clear/ yellow.

## 2021-12-07 ENCOUNTER — Ambulatory Visit
Admission: RE | Admit: 2021-12-07 | Discharge: 2021-12-07 | Disposition: A | Discharge status: Home / Self Care | Payer: Medicare PPO | Source: Ambulatory Visit | Attending: Family | Admitting: Family

## 2021-12-07 DIAGNOSIS — Z780 Asymptomatic menopausal state: Secondary | ICD-10-CM | POA: Insufficient documentation

## 2021-12-08 ENCOUNTER — Ambulatory Visit: Payer: Medicare PPO | Admitting: Family

## 2021-12-08 ENCOUNTER — Encounter: Payer: Self-pay | Admitting: Family

## 2021-12-08 VITALS — BP 118/60 | HR 77 | Temp 98.8°F | Resp 16 | Ht 58.5 in | Wt 139.0 lb

## 2021-12-08 DIAGNOSIS — H6903 Patulous Eustachian tube, bilateral: Secondary | ICD-10-CM | POA: Diagnosis not present

## 2021-12-08 DIAGNOSIS — R053 Chronic cough: Secondary | ICD-10-CM | POA: Diagnosis not present

## 2021-12-08 DIAGNOSIS — J209 Acute bronchitis, unspecified: Secondary | ICD-10-CM

## 2021-12-08 DIAGNOSIS — J9801 Acute bronchospasm: Secondary | ICD-10-CM | POA: Diagnosis not present

## 2021-12-08 DIAGNOSIS — J3489 Other specified disorders of nose and nasal sinuses: Secondary | ICD-10-CM

## 2021-12-08 DIAGNOSIS — J302 Other seasonal allergic rhinitis: Secondary | ICD-10-CM | POA: Diagnosis not present

## 2021-12-08 MED ORDER — FLUTICASONE PROPIONATE HFA 110 MCG/ACT IN AERO: 1.0000 | INHALATION_SPRAY | Freq: Two times a day (BID) | RESPIRATORY_TRACT | 12 refills | Status: DC

## 2021-12-08 MED ORDER — MUPIROCIN 2 % EX OINT: 1.0000 | TOPICAL_OINTMENT | Freq: Two times a day (BID) | CUTANEOUS | 0 refills | Status: AC

## 2021-12-08 MED ORDER — CETIRIZINE HCL 10 MG PO TABS: 10.0000 mg | ORAL_TABLET | Freq: Every day | ORAL | 11 refills | Status: DC

## 2021-12-08 MED ORDER — MONTELUKAST SODIUM 10 MG PO TABS: 10.0000 mg | ORAL_TABLET | Freq: Every day | ORAL | 3 refills | Status: DC

## 2021-12-08 MED ORDER — SPACER/AERO-HOLDING CHAMBERS DEVI: 0 refills | Status: DC

## 2021-12-08 MED ORDER — BENZONATATE 200 MG PO CAPS: 200.0000 mg | ORAL_CAPSULE | Freq: Two times a day (BID) | ORAL | 0 refills | Status: DC | PRN

## 2021-12-08 NOTE — Patient Instructions (Addendum)
  Start night time zyrtec for allergies Start nightly Singulair which will help open up your airways Continue your prednisone until prescription completed.  Continue zpack until completed.   Mupirocin ointment for nose, apply for Q tip.  Start flovent 1 puff twice daily to see if this helps with symptoms.   Regards,   [name] FNP-C

## 2021-12-08 NOTE — Progress Notes (Signed)
Established Patient Office Visit  Subjective:  Patient ID: Vanessa Walton, female    DOB: 1950/09/30  Age: 71 y.o. MRN: [id number]  CC:  Chief Complaint  Patient presents with   Follow-up    HPI Vanessa Walton is here today for follow up.  Recently returned from three week trip to [place].  Started with sinus headache the week of august 13th restarted azelastine, although irritated her nose. 8/23 started with a cough. 8/26 went to urgent care in [place] was given rx for amoxicillin for ten days. She didn't have much improvement so did a video visit on 8/3,and was given prednisone RX for 40 mg daily for three days. She then went off to [place].   At night she would suffered with a bronchial spasm where she had sob and couldn't seem to catch her breath. This occurred probably about one week ago. She did get some ventolin HFA which gave her some mild relief.   Once back from [place] went to urgent care again 10/3 and was given zpack and prednisone as well for five days. She was told to continue prn with albuterol. She is on day four of zpack. She has mild improvement and did sleep last night without having to cough all night. CXR 10/2 with no acute cardiopulmonary abn.   With some sob.     Past Medical History:  Diagnosis Date   Arthritis    right index finger   Basal cell carcinoma 02/24/2020   L nasal dorsum, MOHs 04/15/20   Dysplastic nevus 12/27/2020   Spinal mid upper back, Severe atypia, excised 03/14/2021   Dysplastic nevus 12/27/2020   Left Lower Back, severe atypia, EXC 03/21/2021   GERD (gastroesophageal reflux disease)    Herpes simplex virus (HSV) infection    Hyperlipidemia    Meniere disease 02/13/2019   Meniere's disease     Past Surgical History:  Procedure Laterality Date   CESAREAN SECTION     COLONOSCOPY WITH PROPOFOL N/A 08/22/2017   Procedure: COLONOSCOPY WITH PROPOFOL;  Surgeon: Lin Landsman, MD;  Location: [place];  Service: Endoscopy;   Laterality: N/A;   POLYPECTOMY  08/22/2017   Procedure: POLYPECTOMY;  Surgeon: Lin Landsman, MD;  Location: [place];  Service: Endoscopy;;   TONSILLECTOMY      Family History  Problem Relation Age of Onset   Arthritis Mother    Hyperlipidemia Mother    Hyperlipidemia Father    Breast cancer Neg Hx     Social History   Socioeconomic History   Marital status: Married    Spouse name: Not on file   Number of children: 2   Years of education: Not on file   Highest education level: Not on file  Occupational History   Occupation: retired    Fish farm manager: [employer]  Tobacco Use   Smoking status: Never   Smokeless tobacco: Never   Tobacco comments:    smoked socially as teenager  Vaping Use   Vaping Use: Never used  Substance and Sexual Activity   Alcohol use: Yes    Alcohol/week: 6.0 standard drinks of alcohol    Types: 6 Glasses of wine per week    Comment: moderate   Drug use: No   Sexual activity: Yes    Partners: Male    Birth control/protection: None  Other Topics Concern   Not on file  Social History Narrative   Age 65 daughter np school   Age 55 female   Social Determinants of  Health   Financial Resource Strain: Not on file  Food Insecurity: Not on file  Transportation Needs: Not on file  Physical Activity: Not on file  Stress: Not on file  Social Connections: Not on file  Intimate Partner Violence: Not on file    Outpatient Medications Prior to Visit  Medication Sig Dispense Refill   azithromycin (ZITHROMAX) 250 MG tablet Take 1 tablet (250 mg total) by mouth daily. Take first 2 tablets together, then 1 every day until finished. 6 tablet 0   B Complex Vitamins (B COMPLEX PO) Take by mouth daily.     Biotin 10000 MCG TABS Take by mouth.     Cholecalciferol (VITAMIN D3) 50 MCG (2000 UT) TABS Take by mouth.     GLUCOSAMINE CHONDROITIN COMPLX PO Take by mouth daily.     Ivermectin (SOOLANTRA) 1 % CREA Apply to face nightly. 45 g 2   Magnesium 400  MG TABS Take by mouth.     Omega-3 Fatty Acids (FISH OIL DOUBLE STRENGTH) 1200 MG CAPS Take by mouth 2 (two) times daily.     predniSONE (DELTASONE) 10 MG tablet Take 4 tablets (40 mg total) by mouth daily for 5 days. 20 tablet 0   Probiotic Product (PROBIOTIC DAILY PO) Take by mouth.     rosuvastatin (CRESTOR) 10 MG tablet Take 1 tablet (10 mg total) by mouth at bedtime. 90 tablet 1   valACYclovir (VALTREX) 1000 MG tablet Take 1 tablet (1,000 mg total) by mouth daily. For 5 days. May repeat if needed 30 tablet 2   vitamin E 400 UNIT capsule Take 400 Units by mouth daily.     XIIDRA 5 % SOLN      benzonatate (TESSALON) 100 MG capsule Take 1 capsule (100 mg total) by mouth 2 (two) times daily as needed for cough. 20 capsule 0   No facility-administered medications prior to visit.    Allergies  Allergen Reactions   Astelin [Azelastine] Other (See Comments)    Irritation to nose   Flonase [Fluticasone] Other (See Comments)    Due to chronic eustacian tube dysfunction          Objective:    Physical Exam Constitutional:      General: She is not in acute distress.    Appearance: Normal appearance. She is not ill-appearing.  HENT:     Right Ear: Tympanic membrane normal.     Left Ear: Tympanic membrane normal.     Nose: Mucosal edema and rhinorrhea present. No congestion.     Right Turbinates: Not enlarged or swollen.     Left Turbinates: Not enlarged or swollen.     Right Sinus: No maxillary sinus tenderness or frontal sinus tenderness.     Left Sinus: No maxillary sinus tenderness or frontal sinus tenderness.     Comments: Lesion left nares internal with mild erythema     Mouth/Throat:     Mouth: Mucous membranes are moist.     Pharynx: Posterior oropharyngeal erythema (cobblestoning) present. No pharyngeal swelling or oropharyngeal exudate.     Tonsils: No tonsillar exudate.  Eyes:     Extraocular Movements: Extraocular movements intact.     Conjunctiva/sclera:  Conjunctivae normal.     Pupils: Pupils are equal, round, and reactive to light.  Neck:     Thyroid: No thyroid mass.  Cardiovascular:     Rate and Rhythm: Normal rate and regular rhythm.  Pulmonary:     Effort: Pulmonary effort is normal.  Breath sounds: Normal breath sounds.  Lymphadenopathy:     Cervical:     Right cervical: No superficial cervical adenopathy.    Left cervical: No superficial cervical adenopathy.  Neurological:     Mental Status: She is alert.      BP 118/60   Pulse 77   Temp 98.8 F (37.1 C)   Resp 16   Ht 4' 10.5" (1.486 m)   Wt 139 lb (63 kg)   SpO2 98%   BMI 28.56 kg/m  Wt Readings from Last 3 Encounters:  12/08/21 139 lb (63 kg)  12/05/21 135 lb (61.2 kg)  10/29/21 135 lb (61.2 kg)     Health Maintenance Due  Topic Date Due   COLONOSCOPY (Pts 45-71yr Insurance coverage will need to be confirmed)  08/22/2020    There are no preventive care reminders to display for this patient.  Lab Results  Component Value Date   TSH 1.50 05/28/2020   Lab Results  Component Value Date   WBC 5.0 10/21/2021   HGB 12.9 10/21/2021   HCT 38.8 10/21/2021   MCV 96.2 10/21/2021   PLT 169.0 10/21/2021   Lab Results  Component Value Date   NA 137 10/21/2021   K 4.1 10/21/2021   CO2 23 10/21/2021   GLUCOSE 90 10/21/2021   BUN 21 10/21/2021   CREATININE 0.77 10/21/2021   BILITOT 0.5 10/21/2021   ALKPHOS 33 (L) 10/21/2021   AST 33 10/21/2021   ALT 36 (H) 10/21/2021   PROT 7.4 10/21/2021   ALBUMIN 4.6 10/21/2021   CALCIUM 9.7 10/21/2021   GFR 77.92 10/21/2021   Lab Results  Component Value Date   CHOL 236 (H) 10/21/2021   Lab Results  Component Value Date   HDL 83.10 10/21/2021   Lab Results  Component Value Date   LDLCALC 128 (H) 10/21/2021   Lab Results  Component Value Date   TRIG 126.0 10/21/2021   Lab Results  Component Value Date   CHOLHDL 3 10/21/2021   No results found for: "HGBA1C"    Assessment & Plan:   Problem  List Items Addressed This Visit       Respiratory   Seasonal allergic rhinitis   Relevant Medications   cetirizine (ZYRTEC) 10 MG tablet   RESOLVED: Bronchitis with bronchospasm   Relevant Medications   montelukast (SINGULAIR) 10 MG tablet   fluticasone (FLOVENT HFA) 110 MCG/ACT inhaler   Spacer/Aero-Holding Chambers DEVI     Other   Lesion of nose    Rx mupirocin ointment 2%      Relevant Medications   mupirocin ointment (BACTROBAN) 2 %   Bronchospasm    Rx sent for Flovent 100 mcg patient to start and see if this improves Start singular 10 mg nightly Tessalon Perles 200 mg as needed for acute cough  Start taking Zyrtec nightly 10 mg over-the-counter      Other Visit Diagnoses     Persistent cough    -  Primary   Relevant Medications   benzonatate (TESSALON) 200 MG capsule   Spacer/Aero-Holding Chambers DEVI   Patulous eustachian tube of both ears           Meds ordered this encounter  Medications   benzonatate (TESSALON) 200 MG capsule    Sig: Take 1 capsule (200 mg total) by mouth 2 (two) times daily as needed for cough.    Dispense:  20 capsule    Refill:  0    Order Specific Question:   Supervising  Provider    Answer:   Diona Browner, AMY E [[id number]]   montelukast (SINGULAIR) 10 MG tablet    Sig: Take 1 tablet (10 mg total) by mouth at bedtime.    Dispense:  30 tablet    Refill:  3    Order Specific Question:   Supervising Provider    Answer:   BEDSOLE, AMY E [[id number]]   cetirizine (ZYRTEC) 10 MG tablet    Sig: Take 1 tablet (10 mg total) by mouth daily.    Dispense:  30 tablet    Refill:  11    Order Specific Question:   Supervising Provider    Answer:   Diona Browner, AMY E [[id number]]   mupirocin ointment (BACTROBAN) 2 %    Sig: Apply 1 Application topically 2 (two) times daily for 10 days.    Dispense:  20 g    Refill:  0    Order Specific Question:   Supervising Provider    Answer:   BEDSOLE, AMY E [[id number]]   fluticasone (FLOVENT HFA) 110 MCG/ACT inhaler    Sig:  Inhale 1 puff into the lungs 2 (two) times daily.    Dispense:  1 each    Refill:  12    Order Specific Question:   Supervising Provider    Answer:   Diona Browner, AMY E [[id number]]   Spacer/Aero-Holding Chambers DEVI    Sig: Use spacer with use of flovent inhaler which is one puff twice daily    Dispense:  1 applicator    Refill:  0    Order Specific Question:   Supervising Provider    Answer:   Diona Browner, AMY E [[id number]]    Follow-up: Return in about 1 week (around 12/15/2021) for f/u cough.    [name], FNP

## 2021-12-09 ENCOUNTER — Ambulatory Visit: Admit: 2021-12-09 | Payer: Medicare PPO | Admitting: Gastroenterology

## 2021-12-09 DIAGNOSIS — J9801 Acute bronchospasm: Secondary | ICD-10-CM | POA: Insufficient documentation

## 2021-12-09 DIAGNOSIS — J3489 Other specified disorders of nose and nasal sinuses: Secondary | ICD-10-CM | POA: Insufficient documentation

## 2021-12-09 DIAGNOSIS — J302 Other seasonal allergic rhinitis: Secondary | ICD-10-CM | POA: Insufficient documentation

## 2021-12-09 DIAGNOSIS — J209 Acute bronchitis, unspecified: Secondary | ICD-10-CM | POA: Insufficient documentation

## 2021-12-09 SURGERY — COLONOSCOPY WITH PROPOFOL
Anesthesia: General

## 2021-12-09 NOTE — Assessment & Plan Note (Signed)
Rx mupirocin ointment 2%

## 2021-12-09 NOTE — Assessment & Plan Note (Signed)
Rx sent for Flovent 100 mcg patient to start and see if this improves Start singular 10 mg nightly Tessalon Perles 200 mg as needed for acute cough  Start taking Zyrtec nightly 10 mg over-the-counter

## 2021-12-13 ENCOUNTER — Other Ambulatory Visit: Payer: Self-pay | Admitting: Family

## 2021-12-13 ENCOUNTER — Ambulatory Visit
Admission: RE | Admit: 2021-12-13 | Discharge: 2021-12-13 | Disposition: A | Discharge status: Home / Self Care | Payer: Medicare PPO | Source: Ambulatory Visit | Attending: Family | Admitting: Family

## 2021-12-13 DIAGNOSIS — N281 Cyst of kidney, acquired: Secondary | ICD-10-CM | POA: Diagnosis not present

## 2021-12-13 DIAGNOSIS — D179 Benign lipomatous neoplasm, unspecified: Secondary | ICD-10-CM | POA: Diagnosis not present

## 2021-12-13 DIAGNOSIS — R7989 Other specified abnormal findings of blood chemistry: Secondary | ICD-10-CM | POA: Insufficient documentation

## 2021-12-13 DIAGNOSIS — N2889 Other specified disorders of kidney and ureter: Secondary | ICD-10-CM

## 2021-12-13 DIAGNOSIS — N289 Disorder of kidney and ureter, unspecified: Secondary | ICD-10-CM

## 2021-12-13 NOTE — Progress Notes (Signed)
There was a simple cyst in the right kidney on the ultrasound and there was a kidney lesion in the left side of the kidney and they need a closer look at it to determine exactly what this is.  I have ordered a CT scan of the kidneys and you should hear something from the referral coordinator once approved.

## 2021-12-14 DIAGNOSIS — M9903 Segmental and somatic dysfunction of lumbar region: Secondary | ICD-10-CM | POA: Diagnosis not present

## 2021-12-14 DIAGNOSIS — M9902 Segmental and somatic dysfunction of thoracic region: Secondary | ICD-10-CM | POA: Diagnosis not present

## 2021-12-14 DIAGNOSIS — M9905 Segmental and somatic dysfunction of pelvic region: Secondary | ICD-10-CM | POA: Diagnosis not present

## 2021-12-14 DIAGNOSIS — M9901 Segmental and somatic dysfunction of cervical region: Secondary | ICD-10-CM | POA: Diagnosis not present

## 2021-12-15 ENCOUNTER — Ambulatory Visit: Payer: Medicare PPO | Admitting: Family

## 2021-12-15 ENCOUNTER — Encounter: Payer: Self-pay | Admitting: Family

## 2021-12-15 VITALS — BP 116/76 | HR 82 | Temp 97.5°F | Ht 58.5 in | Wt 141.2 lb

## 2021-12-15 DIAGNOSIS — L237 Allergic contact dermatitis due to plants, except food: Secondary | ICD-10-CM | POA: Diagnosis not present

## 2021-12-15 DIAGNOSIS — Z860100 Personal history of colon polyps, unspecified: Secondary | ICD-10-CM | POA: Insufficient documentation

## 2021-12-15 DIAGNOSIS — J9801 Acute bronchospasm: Secondary | ICD-10-CM

## 2021-12-15 MED ORDER — CLOBETASOL PROPIONATE 0.05 % EX CREA: 1.0000 | TOPICAL_CREAM | Freq: Two times a day (BID) | CUTANEOUS | 0 refills | Status: DC

## 2021-12-15 MED ORDER — PREDNISONE 20 MG PO TABS: ORAL_TABLET | ORAL | 0 refills | Status: DC

## 2021-12-15 NOTE — Patient Instructions (Signed)
   Regards,   Sloane Junkin FNP-C  

## 2021-12-15 NOTE — Progress Notes (Signed)
Established Patient Office Visit  Subjective:  Patient ID: Vanessa Walton, female    DOB: 06-Jul-1950  Age: 71 y.o. MRN: [id number]  CC:  Chief Complaint  Patient presents with   Cough    1 w f/u. Cough has significantly improved. No other sxs. Rash on legs from possible poison oak would like advise     HPI Vanessa Walton is here today for follow up.   Nose lesion, used bactroban with some improvement and doing well with this.   Bronchospasm: last visit started on Singulair 10 mg nightly as well as flovent 100 mcg which is helping significantly. She has had significant improvement in her cough and no longer with chest congestion. Still with productive cough but with much improvement.   Has CT scanned for next work.  Will reschedule colonoscopy as well, cancelled due to her coughing fits.   Also was in the garden a few days ago and got into poison something, and now with itchy rash that isn't really improving , she is applying calamine to the site which helps slightly with the itch.   Recent u/s with left kidney lesion 0.5 cm, has scheduled CT renal abd 10/18 to evaluate further. Notably, her sister found a kidney lesion herself and had a recent scan that showed she had bladder cancer which worries her slightly.   Past Medical History:  Diagnosis Date   Arthritis    right index finger   Basal cell carcinoma 02/24/2020   L nasal dorsum, MOHs 04/15/20   Dysplastic nevus 12/27/2020   Spinal mid upper back, Severe atypia, excised 03/14/2021   Dysplastic nevus 12/27/2020   Left Lower Back, severe atypia, EXC 03/21/2021   GERD (gastroesophageal reflux disease)    Herpes simplex virus (HSV) infection    Hyperlipidemia    Meniere disease 02/13/2019   Meniere's disease     Past Surgical History:  Procedure Laterality Date   CESAREAN SECTION     COLONOSCOPY WITH PROPOFOL N/A 08/22/2017   Procedure: COLONOSCOPY WITH PROPOFOL;  Surgeon: Lin Landsman, MD;  Location: [facility];  Service: Endoscopy;  Laterality: N/A;   POLYPECTOMY  08/22/2017   Procedure: POLYPECTOMY;  Surgeon: Lin Landsman, MD;  Location: [facility];  Service: Endoscopy;;   TONSILLECTOMY      Family History  Problem Relation Age of Onset   Arthritis Mother    Hyperlipidemia Mother    Hyperlipidemia Father    Breast cancer Neg Hx     Social History   Socioeconomic History   Marital status: Married    Spouse name: Not on file   Number of children: 2   Years of education: Not on file   Highest education level: Not on file  Occupational History   Occupation: retired    Fish farm manager: OTHER  Tobacco Use   Smoking status: Never   Smokeless tobacco: Never   Tobacco comments:    smoked socially as teenager  Vaping Use   Vaping Use: Never used  Substance and Sexual Activity   Alcohol use: Yes    Alcohol/week: 6.0 standard drinks of alcohol    Types: 6 Glasses of wine per week    Comment: moderate   Drug use: No   Sexual activity: Yes    Partners: Male    Birth control/protection: None  Other Topics Concern   Not on file  Social History Narrative   Age 8 daughter np school   Age 71 female   Social Determinants of  Health   Financial Resource Strain: Not on file  Food Insecurity: Not on file  Transportation Needs: Not on file  Physical Activity: Not on file  Stress: Not on file  Social Connections: Not on file  Intimate Partner Violence: Not on file    Outpatient Medications Prior to Visit  Medication Sig Dispense Refill   azithromycin (ZITHROMAX) 250 MG tablet Take 1 tablet (250 mg total) by mouth daily. Take first 2 tablets together, then 1 every day until finished. (Patient not taking: Reported on 12/15/2021) 6 tablet 0   B Complex Vitamins (B COMPLEX PO) Take by mouth daily.     benzonatate (TESSALON) 200 MG capsule Take 1 capsule (200 mg total) by mouth 2 (two) times daily as needed for cough. 20 capsule 0   Biotin 10000 MCG TABS Take by mouth.      cetirizine (ZYRTEC) 10 MG tablet Take 1 tablet (10 mg total) by mouth daily. 30 tablet 11   Cholecalciferol (VITAMIN D3) 50 MCG (2000 UT) TABS Take by mouth.     fluticasone (FLOVENT HFA) 110 MCG/ACT inhaler Inhale 1 puff into the lungs 2 (two) times daily. 1 each 12   GLUCOSAMINE CHONDROITIN COMPLX PO Take by mouth daily.     Ivermectin (SOOLANTRA) 1 % CREA Apply to face nightly. 45 g 2   Magnesium 400 MG TABS Take by mouth.     montelukast (SINGULAIR) 10 MG tablet Take 1 tablet (10 mg total) by mouth at bedtime. 30 tablet 3   mupirocin ointment (BACTROBAN) 2 % Apply 1 Application topically 2 (two) times daily for 10 days. 20 g 0   Omega-3 Fatty Acids (FISH OIL DOUBLE STRENGTH) 1200 MG CAPS Take by mouth 2 (two) times daily.     Probiotic Product (PROBIOTIC DAILY PO) Take by mouth.     rosuvastatin (CRESTOR) 10 MG tablet Take 1 tablet (10 mg total) by mouth at bedtime. 90 tablet 1   Spacer/Aero-Holding Chambers DEVI Use spacer with use of flovent inhaler which is one puff twice daily 1 applicator 0   valACYclovir (VALTREX) 1000 MG tablet Take 1 tablet (1,000 mg total) by mouth daily. For 5 days. May repeat if needed 30 tablet 2   vitamin E 400 UNIT capsule Take 400 Units by mouth daily.     XIIDRA 5 % SOLN      No facility-administered medications prior to visit.    Allergies  Allergen Reactions   Astelin [Azelastine] Other (See Comments)    Irritation to nose   Flonase [Fluticasone] Other (See Comments)    Due to chronic eustacian tube dysfunction        Objective:    Physical Exam Constitutional:      General: She is not in acute distress.    Appearance: Normal appearance. She is normal weight. She is not ill-appearing, toxic-appearing or diaphoretic.  Cardiovascular:     Rate and Rhythm: Normal rate and regular rhythm.  Pulmonary:     Effort: Pulmonary effort is normal.  Skin:    General: Skin is warm.     Findings: Rash (raised erythematic rash with linear striations  and small red papules on left inner thigh) present.  Neurological:     General: No focal deficit present.     Mental Status: She is alert and oriented to person, place, and time. Mental status is at baseline.  Psychiatric:        Mood and Affect: Mood normal.        Behavior:  Behavior normal.        Thought Content: Thought content normal.        Judgment: Judgment normal.         BP 116/76   Pulse 82   Temp (!) 97.5 F (36.4 C) (Temporal)   Ht 4' 10.5" (1.486 m)   Wt 141 lb 3.2 oz (64 kg)   SpO2 96%   BMI 29.01 kg/m  Wt Readings from Last 3 Encounters:  12/15/21 141 lb 3.2 oz (64 kg)  12/08/21 139 lb (63 kg)  12/05/21 135 lb (61.2 kg)     Health Maintenance Due  Topic Date Due   COLONOSCOPY (Pts 45-20yr Insurance coverage will need to be confirmed)  08/22/2020   COVID-19 Vaccine (6 - Pfizer risk series) 12/17/2021    There are no preventive care reminders to display for this patient.  Lab Results  Component Value Date   TSH 1.50 05/28/2020   Lab Results  Component Value Date   WBC 5.0 10/21/2021   HGB 12.9 10/21/2021   HCT 38.8 10/21/2021   MCV 96.2 10/21/2021   PLT 169.0 10/21/2021   Lab Results  Component Value Date   NA 137 10/21/2021   K 4.1 10/21/2021   CO2 23 10/21/2021   GLUCOSE 90 10/21/2021   BUN 21 10/21/2021   CREATININE 0.77 10/21/2021   BILITOT 0.5 10/21/2021   ALKPHOS 33 (L) 10/21/2021   AST 33 10/21/2021   ALT 36 (H) 10/21/2021   PROT 7.4 10/21/2021   ALBUMIN 4.6 10/21/2021   CALCIUM 9.7 10/21/2021   GFR 77.92 10/21/2021   Lab Results  Component Value Date   CHOL 236 (H) 10/21/2021   Lab Results  Component Value Date   HDL 83.10 10/21/2021   Lab Results  Component Value Date   LDLCALC 128 (H) 10/21/2021   Lab Results  Component Value Date   TRIG 126.0 10/21/2021   Lab Results  Component Value Date   CHOLHDL 3 10/21/2021   No results found for: "HGBA1C"    Assessment & Plan:   Problem List Items Addressed  This Visit       Musculoskeletal and Integument   Poison ivy dermatitis - Primary    rx prednisone 20 taper  Clobetasol 0.1% ointment prescribed apply twice daily Please monitor site for worsening signs/symptoms of infection to include: increasing redness, increasing tenderness, increase in size, and or pustulant drainage from site. If this is to occur please let me know immediately.        Relevant Medications   predniSONE (DELTASONE) 20 MG tablet   clobetasol cream (TEMOVATE) 0.05 %     Other   Bronchospasm    Improving Continue flovent 100 mcg x two more weeks Continue singulair 10 mg nightly       History of colon polyps    Meds ordered this encounter  Medications   predniSONE (DELTASONE) 20 MG tablet    Sig: Take two tablets po qd for five days, one tablet po qd for five days, then 1/2 tablet po qd for five days    Dispense:  18 tablet    Refill:  0    Order Specific Question:   Supervising Provider    Answer:   BEDSOLE, AMY E [[id number]]   clobetasol cream (TEMOVATE) 0.05 %    Sig: Apply 1 Application topically 2 (two) times daily.    Dispense:  30 g    Refill:  0    Order Specific Question:  Supervising Provider    Answer:   Eliezer Lofts E [[id number]]    Follow-up: Return in about 6 months (around 06/16/2022) for f/u cholesterol.    [name], FNP

## 2021-12-15 NOTE — Assessment & Plan Note (Signed)
Improving Continue flovent 100 mcg x two more weeks Continue singulair 10 mg nightly

## 2021-12-15 NOTE — Assessment & Plan Note (Signed)
rx prednisone 20 taper  Clobetasol 0.1% ointment prescribed apply twice daily Please monitor site for worsening signs/symptoms of infection to include: increasing redness, increasing tenderness, increase in size, and or pustulant drainage from site. If this is to occur please let me know immediately.

## 2021-12-21 ENCOUNTER — Ambulatory Visit
Admission: RE | Admit: 2021-12-21 | Discharge: 2021-12-21 | Disposition: A | Discharge status: Home / Self Care | Payer: Medicare PPO | Source: Ambulatory Visit | Attending: Family | Admitting: Family

## 2021-12-21 DIAGNOSIS — N2889 Other specified disorders of kidney and ureter: Secondary | ICD-10-CM | POA: Diagnosis not present

## 2021-12-21 DIAGNOSIS — N281 Cyst of kidney, acquired: Secondary | ICD-10-CM | POA: Diagnosis not present

## 2021-12-21 DIAGNOSIS — R160 Hepatomegaly, not elsewhere classified: Secondary | ICD-10-CM | POA: Diagnosis not present

## 2021-12-21 LAB — POCT I-STAT CREATININE: Creatinine, Ser: 0.8 mg/dL (ref 0.44–1.00)

## 2021-12-21 MED ORDER — IOHEXOL 300 MG/ML  SOLN
100.0000 mL | Freq: Once | INTRAMUSCULAR | Status: AC | PRN
  Administered 2021-12-21: 100 mL via INTRAVENOUS

## 2021-12-27 ENCOUNTER — Ambulatory Visit: Payer: Medicare PPO | Admitting: Dermatology

## 2021-12-28 ENCOUNTER — Encounter: Payer: Self-pay | Admitting: Dermatology

## 2021-12-28 ENCOUNTER — Other Ambulatory Visit: Payer: Self-pay | Admitting: Family

## 2021-12-28 ENCOUNTER — Ambulatory Visit: Payer: Medicare PPO | Admitting: Dermatology

## 2021-12-28 DIAGNOSIS — D225 Melanocytic nevi of trunk: Secondary | ICD-10-CM

## 2021-12-28 DIAGNOSIS — L814 Other melanin hyperpigmentation: Secondary | ICD-10-CM

## 2021-12-28 DIAGNOSIS — L720 Epidermal cyst: Secondary | ICD-10-CM | POA: Diagnosis not present

## 2021-12-28 DIAGNOSIS — L821 Other seborrheic keratosis: Secondary | ICD-10-CM

## 2021-12-28 DIAGNOSIS — Z1283 Encounter for screening for malignant neoplasm of skin: Secondary | ICD-10-CM | POA: Diagnosis not present

## 2021-12-28 DIAGNOSIS — K760 Fatty (change of) liver, not elsewhere classified: Secondary | ICD-10-CM

## 2021-12-28 DIAGNOSIS — N289 Disorder of kidney and ureter, unspecified: Secondary | ICD-10-CM

## 2021-12-28 DIAGNOSIS — L719 Rosacea, unspecified: Secondary | ICD-10-CM | POA: Diagnosis not present

## 2021-12-28 DIAGNOSIS — D229 Melanocytic nevi, unspecified: Secondary | ICD-10-CM

## 2021-12-28 DIAGNOSIS — Z85828 Personal history of other malignant neoplasm of skin: Secondary | ICD-10-CM

## 2021-12-28 DIAGNOSIS — Z86018 Personal history of other benign neoplasm: Secondary | ICD-10-CM | POA: Diagnosis not present

## 2021-12-28 DIAGNOSIS — D2239 Melanocytic nevi of other parts of face: Secondary | ICD-10-CM

## 2021-12-28 DIAGNOSIS — L918 Other hypertrophic disorders of the skin: Secondary | ICD-10-CM

## 2021-12-28 DIAGNOSIS — L578 Other skin changes due to chronic exposure to nonionizing radiation: Secondary | ICD-10-CM

## 2021-12-28 NOTE — Progress Notes (Signed)
Liver does show some enlargement , and there are some liver areas that are unable to distinguish exactly waht we are looking at on the Ct. They suggest a possible small hemangioma of the liver. At this point, I do suggest we send you to Gi for more evaluation. Will refer to GI for fatty liver.   Right kidney with small cyst and liver lesion, however per radiology no follow up is needed as do not appearing concerning.

## 2021-12-28 NOTE — Patient Instructions (Addendum)
Clogged pores/Milia Start Effaclar pea-sized amount to face at bedtime. Available over the counter.  CeraVe acne control cleanser.   Rosacea Continue [facility] metronidazole/ivermectin/azelaic acid twice daily as needed to affected areas on the face. The patient was advised this is not covered by insurance since it is made by a compounding pharmacy. They will receive an email to check out and the medication will be mailed to their home.   Instructions for [facility] Medications  One or more of your medications was sent to the [facility] mail order compounding pharmacy. You will receive an email from them and can purchase the medicine through that link. It will then be mailed to your home at the address you confirmed. If for any reason you do not receive an email from them, please check your spam folder. If you still do not find the email, please let us know. [facility] phone number is [phone].    Recommend daily broad spectrum sunscreen SPF 30+ to sun-exposed areas, reapply every 2 hours as needed. Call for new or changing lesions.  Staying in the shade or wearing long sleeves, sun glasses (UVA+UVB protection) and wide brim hats (4-inch brim around the entire circumference of the hat) are also recommended for sun protection.    Melanoma ABCDEs  Melanoma is the most dangerous type of skin cancer, and is the leading cause of death from skin disease.  You are more likely to develop melanoma if you: Have light-colored skin, light-colored eyes, or red or blond hair Spend a lot of time in the sun Tan regularly, either outdoors or in a tanning bed Have had blistering sunburns, especially during childhood Have a close family member who has had a melanoma Have atypical moles or large birthmarks  Early detection of melanoma is key since treatment is typically straightforward and cure rates are extremely high if we catch it early.   The first sign of melanoma is often a  change in a mole or a new dark spot.  The ABCDE system is a way of remembering the signs of melanoma.  A for asymmetry:  The two halves do not match. B for border:  The edges of the growth are irregular. C for color:  A mixture of colors are present instead of an even brown color. D for diameter:  Melanomas are usually (but not always) greater than 69m - the size of a pencil eraser. E for evolution:  The spot keeps changing in size, shape, and color.  Please check your skin once per month between visits. You can use a small mirror in front and a large mirror behind you to keep an eye on the back side or your body.   If you see any new or changing lesions before your next follow-up, please call to schedule a visit.  Please continue daily skin protection including broad spectrum sunscreen SPF 30+ to sun-exposed areas, reapplying every 2 hours as needed when you're outdoors.   Staying in the shade or wearing long sleeves, sun glasses (UVA+UVB protection) and wide brim hats (4-inch brim around the entire circumference of the hat) are also recommended for sun protection.    Due to recent changes in healthcare laws, you may see results of your pathology and/or laboratory studies on MyChart before the doctors have had a chance to review them. We understand that in some cases there may be results that are confusing or concerning to you. Please understand that not all results are received at the same  time and often the doctors may need to interpret multiple results in order to provide you with the best plan of care or course of treatment. Therefore, we ask that you please give [place] 2 business days to thoroughly review all your results before contacting the office for clarification. Should we see a critical lab result, you will be contacted sooner.   If You Need Anything After Your Visit  If you have any questions or concerns for your doctor, please call our main line at [phone] and press option 4 to  reach your doctor's medical assistant. If no one answers, please leave a voicemail as directed and we will return your call as soon as possible. Messages left after 4 pm will be answered the following business day.   You may also send [place] a message via [place]. We typically respond to MyChart messages within 1-2 business days.  For prescription refills, please ask your pharmacy to contact our office. Our fax number is [phone].  If you have an urgent issue when the clinic is closed that cannot wait until the next business day, you can page your doctor at the number below.    Please note that while we do our best to be available for urgent issues outside of office hours, we are not available 24/7.   If you have an urgent issue and are unable to reach [place], you may choose to seek medical care at your doctor's office, retail clinic, urgent care center, or emergency room.  If you have a medical emergency, please immediately call 911 or go to the emergency department.  Pager Numbers  - Dr. Nehemiah Massed: [phone]  - Dr. Laurence Ferrari: [phone]  - Dr. Nicole Kindred: [phone]  In the event of inclement weather, please call our main line at [phone] for an update on the status of any delays or closures.  Dermatology Medication Tips: Please keep the boxes that topical medications come in in order to help keep track of the instructions about where and how to use these. Pharmacies typically print the medication instructions only on the boxes and not directly on the medication tubes.   If your medication is too expensive, please contact our office at [phone] option 4 or send [place] a message through [place].   We are unable to tell what your co-pay for medications will be in advance as this is different depending on your insurance coverage. However, we may be able to find a substitute medication at lower cost or fill out paperwork to get insurance to cover a needed medication.   If a prior  authorization is required to get your medication covered by your insurance company, please allow [place] 1-2 business days to complete this process.  Drug prices often vary depending on where the prescription is filled and some pharmacies may offer cheaper prices.  The website www.goodrx.com contains coupons for medications through different pharmacies. The prices here do not account for what the cost may be with help from insurance (it may be cheaper with your insurance), but the website can give you the price if you did not use any insurance.  - You can print the associated coupon and take it with your prescription to the pharmacy.  - You may also stop by our office during regular business hours and pick up a GoodRx coupon card.  - If you need your prescription sent electronically to a different pharmacy, notify our office through [facility] or by phone at [phone] option 4.  Si Usted Necesita Algo Despus de Su Visita  Tambin puede enviarnos un mensaje a travs de Pharmacist, community. Por lo general respondemos a los mensajes de MyChart en el transcurso de 1 a 2 das hbiles.  Para renovar recetas, por favor pida a su farmacia que se ponga en contacto con nuestra oficina. Harland Dingwall de fax es [place] [phone].  Si tiene un asunto urgente cuando la clnica est cerrada y que no puede esperar hasta el siguiente da hbil, puede llamar/localizar a su doctor(a) al nmero que aparece a continuacin.   Por favor, tenga en cuenta que aunque hacemos todo lo posible para estar disponibles para asuntos urgentes fuera del horario de [place], no estamos disponibles las 24 horas del da, los 7 das de la [place].   Si tiene un problema urgente y no puede comunicarse con nosotros, puede optar por buscar atencin mdica  en el consultorio de su doctor(a), en una clnica privada, en un centro de atencin urgente o en una sala de emergencias.  Si tiene Engineering geologist, por favor llame  inmediatamente al 911 o vaya a la sala de emergencias.  Nmeros de bper  - Dr. Nehemiah Massed: [phone]  - Dra. Moye: [phone]  - Dra. Nicole Kindred: [phone]  En caso de inclemencias del [place], por favor llame a Johnsie Kindred principal al [phone] para una actualizacin sobre el [place] de cualquier retraso o cierre.  Consejos para la medicacin en dermatologa: Por favor, guarde las cajas en las que vienen los medicamentos de uso tpico para ayudarle a seguir las instrucciones sobre dnde y cmo usarlos. Las farmacias generalmente imprimen las instrucciones del medicamento slo en las cajas y no directamente en los tubos del [place].   Si su medicamento es muy caro, por favor, pngase en contacto con Zigmund Daniel llamando al [phone] y presione la opcin 4 o envenos un mensaje a travs de Pharmacist, community.   No podemos decirle cul ser su copago por los medicamentos por adelantado ya que esto es diferente dependiendo de la cobertura de su seguro. Sin embargo, es posible que podamos encontrar un medicamento sustituto a Electrical engineer un formulario para que el seguro cubra el medicamento que se considera necesario.   Si se requiere una autorizacin previa para que su compaa de seguros [place] su medicamento, por favor permtanos de 1 a 2 das hbiles para completar este proceso.  Los precios de los medicamentos varan con frecuencia dependiendo del Environmental consultant de dnde se surte la receta y alguna farmacias pueden ofrecer precios ms baratos.  El sitio web www.goodrx.com tiene cupones para medicamentos de Airline pilot. Los precios aqu no tienen en cuenta lo que podra costar con la ayuda del seguro (puede ser ms barato con su seguro), pero el sitio web puede darle el precio si no utiliz Research scientist (physical sciences).  - Puede imprimir el cupn correspondiente y llevarlo con su receta a la farmacia.  - Tambin puede pasar por nuestra oficina durante el horario de atencin regular y Charity fundraiser  una tarjeta de cupones de GoodRx.  - Si necesita que su receta se enve electrnicamente a una farmacia diferente, informe a nuestra oficina a travs de MyChart de [place] o por telfono llamando al [phone] y presione la opcin 4.

## 2021-12-28 NOTE — Progress Notes (Signed)
Follow-Up Visit   Subjective  Vanessa Walton is a 71 y.o. female who presents for the following: Annual Exam (HxBCC, Hx dysplastic nevi). She has rosacea and uses SM triple cream.  Eyes are better and she has stopped the Oracea.  The patient presents for Total-Body Skin Exam (TBSE) for skin cancer screening and mole check.  The patient has spots, moles and lesions to be evaluated, some may be new or changing and the patient has concerns that these could be cancer.   The following portions of the chart were reviewed this encounter and updated as appropriate:      Review of Systems: No other skin or systemic complaints except as noted in HPI or Assessment and Plan.   Objective  Well appearing patient in no apparent distress; mood and affect are within normal limits.  A full examination was performed including scalp, head, eyes, ears, nose, lips, neck, chest, axillae, abdomen, back, buttocks, bilateral upper extremities, bilateral lower extremities, hands, feet, fingers, toes, fingernails, and toenails. All findings within normal limits unless otherwise noted below.  Left Paranasal, abdomen, back 82m flesh papule at left paranasal.  Tan-brown and/or pink-flesh-colored symmetric macules and papules at abdomen and back. Photos of back/abdomen compared, no changes  face Mild mid face erythema with telangiectasias   face Smooth white papule(s).    Assessment & Plan   History of Basal Cell Carcinoma of the Skin. L nasal dorsum, Mohs 04/15/20 - No evidence of recurrence today - Recommend regular full body skin exams - Recommend daily broad spectrum sunscreen SPF 30+ to sun-exposed areas, reapply every 2 hours as needed.  - Call if any new or changing lesions are noted between office visits  History of Dysplastic Nevi. 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  Left Lower Back, severe atypia, EXC 03/21/2021 - No evidence of recurrence today - Recommend regular full  body skin exams - Recommend daily broad spectrum sunscreen SPF 30+ to sun-exposed areas, reapply every 2 hours as needed.  - Call if any new or changing lesions are noted between office visits    Lentigines - Scattered tan macules, L lower cheek at jaw is clear s/p cosmetic cryotherapy - Due to sun exposure - Benign-appearing, observe - Recommend daily broad spectrum sunscreen SPF 30+ to sun-exposed areas, reapply every 2 hours as needed. - Call for any changes  Seborrheic Keratoses - Stuck-on, waxy, tan-brown papules and/or plaques  - Benign-appearing - Discussed benign etiology and prognosis. - Observe - Call for any changes  Melanocytic Nevi - Tan-brown and/or pink-flesh-colored symmetric macules and papules - Benign appearing on exam today - Observation - Call clinic for new or changing moles - Recommend daily use of broad spectrum spf 30+ sunscreen to sun-exposed areas.   Hemangiomas - Red papules - Discussed benign nature - Observe - Call for any changes  Actinic Damage - Chronic condition, secondary to cumulative UV/sun exposure - diffuse scaly erythematous macules with underlying dyspigmentation - Recommend daily broad spectrum sunscreen SPF 30+ to sun-exposed areas, reapply every 2 hours as needed.  - Staying in the shade or wearing long sleeves, sun glasses (UVA+UVB protection) and wide brim hats (4-inch brim around the entire circumference of the hat) are also recommended for sun protection.  - Call for new or changing lesions.  Skin cancer screening performed today.  Acrochordons (Skin Tags). Neck. - Fleshy, skin-colored pedunculated papules - Benign appearing.  - Observe. - If desired, they can be removed with an in office procedure  that is not covered by insurance. - Please call the clinic if you notice any new or changing lesions.    Nevus Left Paranasal, abdomen, back  Vs sebaceous hyperplasia L paranasal  Benign-appearing.  Observation.  Call  clinic for new or changing lesions.   Nevi Back/Abdomen Benign-appearing. Stable compared to previous visit. Observation.  Call clinic for new or changing moles.  Recommend daily use of broad spectrum spf 30+ sunscreen to sun-exposed areas.     Rosacea face  Chronic condition with duration or expected duration over one year. Currently well-controlled.   Rosacea is a chronic progressive skin condition usually affecting the face of adults, causing redness and/or acne bumps. It is treatable but not curable. It sometimes affects the eyes (ocular rosacea) as well. It may respond to topical and/or systemic medication and can flare with stress, sun exposure, alcohol, exercise, topical steroids (including hydrocortisone/cortisone 10) and some foods.  Daily application of broad spectrum spf 30+ sunscreen to face is recommended to reduce flares.  Continue Skin Medicinals metronidazole/ivermectin/azelaic acid twice daily as needed to affected areas on the face. The patient was advised this is not covered by insurance since it is made by a compounding pharmacy. They will receive an email to check out and the medication will be mailed to their home.    Denies eye symptoms. D/C Oracea for now.  Milia face  Benign, observe.    Start Effaclar gel at bedtime to face. Samples given Start CeraVe acne control cleanser with SA as tolerated   Return in about 6 months (around 06/29/2022) for TBSE, HxBCC, HxDN.  I, Emelia Salisbury, CMA, am acting as scribe for [name], MD.  Documentation: I have reviewed the above documentation for accuracy and completeness, and I agree with the above.  [name] MD

## 2021-12-28 NOTE — Progress Notes (Signed)
Noted. Referral has been placed.

## 2022-01-02 ENCOUNTER — Other Ambulatory Visit: Payer: Self-pay | Admitting: *Deleted

## 2022-01-02 ENCOUNTER — Telehealth: Payer: Self-pay | Admitting: Family

## 2022-01-02 ENCOUNTER — Telehealth: Payer: Self-pay | Admitting: *Deleted

## 2022-01-02 DIAGNOSIS — Z86010 Personal history of colonic polyps: Secondary | ICD-10-CM

## 2022-01-02 NOTE — Telephone Encounter (Signed)
Left message for patient to schedule Annual Wellness Visit.  Please schedule with Nurse Health Advisor Denisa O'Brien-Blaney, LPN This appt can be telephone.  Please call [phone] ask for [facility]

## 2022-01-02 NOTE — Telephone Encounter (Signed)
Patient called office calling to reschedule colonoscopy that was canceled on 12/09/2021. I have already schedule patient for colonoscopy on 02/10/2022.  However, patient have a referral for a kidney lesion and fatty liver which she will need a new patient appointment for.   FYI, Dr Marius Ditch is the one that perform her colonoscopy.  Please call patient. Thank you.

## 2022-01-02 NOTE — Telephone Encounter (Signed)
Patient called office calling to reschedule colonoscopy that was canceled on 12/09/2021. I have already schedule patient for colonoscopy on 02/10/2022.  Patient have already pick up prep solution.

## 2022-02-01 DIAGNOSIS — M9901 Segmental and somatic dysfunction of cervical region: Secondary | ICD-10-CM | POA: Diagnosis not present

## 2022-02-01 DIAGNOSIS — M9905 Segmental and somatic dysfunction of pelvic region: Secondary | ICD-10-CM | POA: Diagnosis not present

## 2022-02-01 DIAGNOSIS — M9902 Segmental and somatic dysfunction of thoracic region: Secondary | ICD-10-CM | POA: Diagnosis not present

## 2022-02-01 DIAGNOSIS — M9903 Segmental and somatic dysfunction of lumbar region: Secondary | ICD-10-CM | POA: Diagnosis not present

## 2022-02-07 ENCOUNTER — Ambulatory Visit (INDEPENDENT_AMBULATORY_CARE_PROVIDER_SITE_OTHER): Payer: Medicare PPO

## 2022-02-07 VITALS — Ht 58.5 in | Wt 141.0 lb

## 2022-02-07 DIAGNOSIS — Z0000 Encounter for general adult medical examination without abnormal findings: Secondary | ICD-10-CM | POA: Diagnosis not present

## 2022-02-07 NOTE — Progress Notes (Signed)
Subjective:   Vanessa Walton is a 71 y.o. female who presents for Medicare Annual (Subsequent) preventive examination.  Review of Systems    Virtual Visit via Telephone Note  I connected with  Vanessa Walton on 02/07/22 at  2:00 PM EST by telephone and verified that I am speaking with the correct person using two identifiers.  Location: Patient: Home Provider: Office Persons participating in the virtual visit: patient/Nurse Health Advisor   I discussed the limitations, risks, security and privacy concerns of performing an evaluation and management service by telephone and the availability of in person appointments. The patient expressed understanding and agreed to proceed.  Interactive audio and video telecommunications were attempted between this nurse and patient, however failed, due to patient having technical difficulties OR patient did not have access to video capability.  We continued and completed visit with audio only.  Some vital signs may be absent or patient reported.   Criselda Peaches, LPN  Cardiac Risk Factors include: advanced age (>24mn, >>48women)     Objective:    Today's Vitals   02/07/22 1359  Weight: 141 lb (64 kg)  Height: 4' 10.5" (1.486 m)   Body mass index is 28.97 kg/m.     02/07/2022    2:08 PM 12/05/2021   11:45 AM 10/29/2021    1:39 PM 08/22/2017    7:46 AM  Advanced Directives  Does Patient Have a Medical Advance Directive? Yes Yes Yes Yes  Type of [place] will Living will  Does patient want to make changes to medical advance directive?    No - Patient declined  Copy of HEdnain Chart? No - copy requested       Current Medications (verified) Outpatient Encounter Medications as of 02/07/2022  Medication Sig   B Complex Vitamins (B COMPLEX PO) Take by mouth daily.   Biotin 10000 MCG TABS Take by  mouth.   cetirizine (ZYRTEC) 10 MG tablet Take 1 tablet (10 mg total) by mouth daily.   Cholecalciferol (VITAMIN D3) 50 MCG (2000 UT) TABS Take by mouth.   clobetasol cream (TEMOVATE) 02.11% Apply 1 Application topically 2 (two) times daily.   fluticasone (FLOVENT HFA) 110 MCG/ACT inhaler Inhale 1 puff into the lungs 2 (two) times daily.   GLUCOSAMINE CHONDROITIN COMPLX PO Take by mouth daily.   Magnesium 400 MG TABS Take by mouth.   montelukast (SINGULAIR) 10 MG tablet Take 1 tablet (10 mg total) by mouth at bedtime.   Omega-3 Fatty Acids (FISH OIL DOUBLE STRENGTH) 1200 MG CAPS Take by mouth 2 (two) times daily.   Probiotic Product (PROBIOTIC DAILY PO) Take by mouth.   rosuvastatin (CRESTOR) 10 MG tablet Take 1 tablet (10 mg total) by mouth at bedtime.   Spacer/Aero-Holding Chambers DEVI Use spacer with use of flovent inhaler which is one puff twice daily   valACYclovir (VALTREX) 1000 MG tablet Take 1 tablet (1,000 mg total) by mouth daily. For 5 days. May repeat if needed   vitamin E 400 UNIT capsule Take 400 Units by mouth daily.   XIIDRA 5 % SOLN    [DISCONTINUED] benzonatate (TESSALON) 200 MG capsule Take 1 capsule (200 mg total) by mouth 2 (two) times daily as needed for cough.   [DISCONTINUED] predniSONE (DELTASONE) 20 MG tablet Take two tablets po qd for five days, one tablet po qd for five days, then 1/2 tablet po  qd for five days   No facility-administered encounter medications on file as of 02/07/2022.    Allergies (verified) Astelin [azelastine] and Flonase [fluticasone]   History: Past Medical History:  Diagnosis Date   Arthritis    right index finger   Basal cell carcinoma 02/24/2020   L nasal dorsum, MOHs 04/15/20   Dysplastic nevus 12/27/2020   Spinal mid upper back, Severe atypia, excised 03/14/2021   Dysplastic nevus 12/27/2020   Left Lower Back, severe atypia, EXC 03/21/2021   GERD (gastroesophageal reflux disease)    Herpes simplex virus (HSV) infection     Hyperlipidemia    Meniere disease 02/13/2019   Meniere's disease    Past Surgical History:  Procedure Laterality Date   CESAREAN SECTION     COLONOSCOPY WITH PROPOFOL N/A 08/22/2017   Procedure: COLONOSCOPY WITH PROPOFOL;  Surgeon: Lin Landsman, MD;  Location: [facility];  Service: Endoscopy;  Laterality: N/A;   POLYPECTOMY  08/22/2017   Procedure: POLYPECTOMY;  Surgeon: Lin Landsman, MD;  Location: [place];  Service: Endoscopy;;   TONSILLECTOMY     Family History  Problem Relation Age of Onset   Arthritis Mother    Hyperlipidemia Mother    Hyperlipidemia Father    Breast cancer Neg Hx    Social History   Socioeconomic History   Marital status: Married    Spouse name: Not on file   Number of children: 2   Years of education: Not on file   Highest education level: Not on file  Occupational History   Occupation: retired    Fish farm manager: OTHER  Tobacco Use   Smoking status: Never   Smokeless tobacco: Never   Tobacco comments:    smoked socially as teenager  Vaping Use   Vaping Use: Never used  Substance and Sexual Activity   Alcohol use: Yes    Alcohol/week: 6.0 standard drinks of alcohol    Types: 6 Glasses of wine per week    Comment: moderate   Drug use: No   Sexual activity: Yes    Partners: Male    Birth control/protection: None  Other Topics Concern   Not on file  Social History Narrative   Age 31 daughter np school   Age 71 female   Social Determinants of Health   Financial Resource Strain: Low Risk  (02/07/2022)   Overall Financial Resource Strain (CARDIA)    Difficulty of Paying Living Expenses: Not hard at all  Food Insecurity: No Food Insecurity (02/07/2022)   Hunger Vital Sign    Worried About Running Out of Food in the Last Year: Never true    [place] in the Last Year: Never true  Transportation Needs: No Transportation Needs (02/07/2022)   PRAPARE - Hydrologist (Medical): No     Lack of Transportation (Non-Medical): No  Physical Activity: Sufficiently Active (02/07/2022)   Exercise Vital Sign    Days of Exercise per Week: 6 days    Minutes of Exercise per Session: 70 min  Stress: No Stress Concern Present (02/07/2022)   [place]    Feeling of Stress : Not at all  Social Connections: [place] (02/07/2022)   Social Connection and Isolation Panel [NHANES]    Frequency of Communication with Friends and Family: More than three times a week    Frequency of Social Gatherings with Friends and Family: More than three times a week  Attends Religious Services: More than 4 times per year    Active Member of Clubs or Organizations: Yes    Attends Archivist Meetings: More than 4 times per year    Marital Status: Married    Tobacco Counseling Counseling given: Not Answered Tobacco comments: smoked socially as teenager   Clinical Intake:  Pre-visit preparation completed: Yes  Pain : No/denies pain     BMI - recorded: 28.97 Nutritional Status: BMI 25 -29 Overweight Nutritional Risks: None Diabetes: No  How often do you need to have someone help you when you read instructions, pamphlets, or other written materials from your doctor or pharmacy?: 1 - Never  Diabetic?  No  Interpreter Needed?: No  Information entered by :: Rolene Arbour LPN   Activities of Daily Living    02/07/2022    2:04 PM 02/05/2022    2:32 PM  In your present state of health, do you have any difficulty performing the following activities:  Hearing? 1 0  Comment Hearing lost followed by [employer] W/o hearing aids   Vision? 0 0  Difficulty concentrating or making decisions? 0 0  Walking or climbing stairs? 0 0  Dressing or bathing? 0 0  Doing errands, shopping? 0 0  Preparing Food and eating ? N N  Using the Toilet? N N  In the past six months, have you accidently leaked urine? N N  Do you  have problems with loss of bowel control? N N  Managing your Medications? N N  Managing your Finances? N N  Housekeeping or managing your Housekeeping? N N    Patient Care Team: [name], FNP as PCP - General (Family Medicine)  Indicate any recent Medical Services you may have received from other than [facility] providers in the past year (date may be approximate).     Assessment:   This is a routine wellness examination for Vanessa Walton.  Hearing/Vision screen Hearing Screening - Comments:: Hearing loss w/o hearing aids. Followed by Audiologist Vision Screening - Comments:: Wears rx glasses - up to date with routine eye exams with  [place] issues and exercise activities discussed: Exercise limited by: None identified   Goals Addressed               This Visit's Progress     Patient stated (pt-stated)        I want to lose 10lbs       Depression Screen    02/07/2022    2:03 PM 05/30/2020   11:17 AM 02/13/2019    9:41 AM 05/24/2017   11:51 AM 04/11/2016    3:25 PM 08/01/2013    2:16 PM  PHQ 2/9 Scores  PHQ - 2 Score 0 0 0 0 0 0    Fall Risk    02/07/2022    2:07 PM 02/05/2022    2:32 PM 10/21/2021    9:43 AM 02/13/2019    9:41 AM 04/11/2016    3:25 PM  Fall Risk   Falls in the past year? 0 0 0 0 No  Number falls in past yr: 0 0 0    Injury with Fall? 0 0 0    Risk for fall due to : No Fall Risks  No Fall Risks    Follow up Falls prevention discussed        [place]:  Any stairs in or around the home? Yes  If so, are there any without handrails?  No  Home free of loose throw rugs in walkways, pet beds, electrical cords, etc? Yes  Adequate lighting in your home to reduce risk of falls? Yes   ASSISTIVE DEVICES UTILIZED TO PREVENT FALLS:  Life alert? No  Use of a cane, walker or w/c? No  Grab bars in the bathroom? Yes  Shower chair or bench in shower? Yes  Elevated toilet seat or a handicapped toilet? No    TIMED UP AND GO:  Was the test performed? No Zenaida Niece Visit  Cognitive Function:        02/07/2022    2:08 PM  6CIT Screen  What Year? 0 points  What month? 0 points  What time? 0 points  Count back from 20 0 points  Months in reverse 0 points  Repeat phrase 0 points  Total Score 0 points    Immunizations Immunization History  Administered Date(s) Administered   Fluad Quad(high Dose 65+) 10/31/2018   Influenza, High Dose Seasonal PF 03/10/2016, 02/11/2018   Influenza,inj,Quad PF,6+ Mos 11/10/2014, 12/07/2016   Influenza-Unspecified 01/12/2020, 11/08/2021   PFIZER Comirnaty(Gray Top)Covid-19 Tri-Sucrose Vaccine 06/04/2020   PFIZER(Purple Top)SARS-COV-2 Vaccination 04/04/2019, 04/27/2019, 11/20/2019   Pfizer Covid-19 Vaccine Bivalent Booster 51yr & up 10/22/2021   Pneumococcal Conjugate-13 04/11/2016   Pneumococcal Polysaccharide-23 05/24/2017   Tdap 05/24/2017   Zoster Recombinat (Shingrix) 07/08/2021, 10/05/2021   Zoster, Live 04/08/2015    TDAP status: Up to date  Flu Vaccine status: Up to date  Pneumococcal vaccine status: Up to date  Covid-19 vaccine status: Completed vaccines  Qualifies for Shingles Vaccine? Yes   Zostavax completed Yes   Shingrix Completed?: Yes  Screening Tests Health Maintenance  Topic Date Due   COLONOSCOPY (Pts 45-444yrInsurance coverage will need to be confirmed)  02/10/2022 (Originally 08/22/2020)   COVID-19 Vaccine (6 - 2023-24 season) 02/23/2022 (Originally 12/17/2021)   Medicare Annual Wellness (AWLely 02/08/2023   MAMMOGRAM  08/05/2023   DTaP/Tdap/Td (2 - Td or Tdap) 05/25/2027   Pneumonia Vaccine 6537Years old  Completed   INFLUENZA VACCINE  Completed   DEXA SCAN  Completed   Hepatitis C Screening  Completed   Zoster Vaccines- Shingrix  Completed   HPV VACCINES  Aged Out    Health Maintenance  There are no preventive care reminders to display for this patient.   Colorectal cancer screening: Referral to GI placed  Scheduled for 02/10/22. Pt aware the office will call re: appt.  Mammogram status: Completed 08/04/21. Repeat every year  Bone Density status: Completed 12/07/21. Results reflect: Bone density results: OSTEOPOROSIS. Repeat every   years.  Lung Cancer Screening: (Low Dose CT Chest recommended if Age 71-80ears, 30 pack-year currently smoking OR have quit w/in 15years.) does not qualify.     Additional Screening:  Hepatitis C Screening: does qualify; Completed 10/21/21  Vision Screening: Recommended annual ophthalmology exams for early detection of glaucoma and other disorders of the eye. Is the patient up to date with their annual eye exam?  Yes  Who is the provider or what is the name of the office in which the patient attends annual eye exams? [place] [facility] [place] is not established with a provider, would they like to be referred to a provider to establish care? No .   Dental Screening: Recommended annual dental exams for proper oral hygiene  Community Resource Referral / Chronic Care Management:  CRR required this visit?  No   CCM required this visit?  No  Plan:     I have personally reviewed and noted the following in the patient's chart:   Medical and social history Use of alcohol, tobacco or illicit drugs  Current medications and supplements including opioid prescriptions. Patient is not currently taking opioid prescriptions. Functional ability and status Nutritional status Physical activity Advanced directives List of other physicians Hospitalizations, surgeries, and ER visits in previous 12 months Vitals Screenings to include cognitive, depression, and falls Referrals and appointments  In addition, I have reviewed and discussed with patient certain preventive protocols, quality metrics, and best practice recommendations. A written personalized care plan for preventive services as well as general preventive health recommendations were provided to patient.      Criselda Peaches, LPN   63/10/1769   Nurse Notes: None

## 2022-02-07 NOTE — Patient Instructions (Addendum)
[name] , Thank you for taking time to come for your Medicare Wellness Visit. I appreciate your ongoing commitment to your health goals. Please review the following plan we discussed and let me know if I can assist you in the future.   These are the goals we discussed:  Goals       Patient stated (pt-stated)      I want to lose 10lbs        This is a list of the screening recommended for you and due dates:  Health Maintenance  Topic Date Due   Colon Cancer Screening  02/10/2022*   COVID-19 Vaccine (6 - 2023-24 season) 02/23/2022*   Medicare Annual Wellness Visit  02/08/2023   Mammogram  08/05/2023   DTaP/Tdap/Td vaccine (2 - Td or Tdap) 05/25/2027   Pneumonia Vaccine  Completed   Flu Shot  Completed   DEXA scan (bone density measurement)  Completed   Hepatitis C Screening: USPSTF Recommendation to screen - Ages 38-79 yo.  Completed   Zoster (Shingles) Vaccine  Completed   HPV Vaccine  Aged Out  *Topic was postponed. The date shown is not the original due date.    Advanced directives: Please bring a copy of your health care power of attorney and living will to the office to be added to your chart at your convenience.   Conditions/risks identified: None  Next appointment: Follow up in one year for your annual wellness visit     Preventive Care 65 Years and Older, Female Preventive care refers to lifestyle choices and visits with your health care provider that can promote health and wellness. What does preventive care include? A yearly physical exam. This is also called an annual well check. Dental exams once or twice a year. Routine eye exams. Ask your health care provider how often you should have your eyes checked. Personal lifestyle choices, including: Daily care of your teeth and gums. Regular physical activity. Eating a healthy diet. Avoiding tobacco and drug use. Limiting alcohol use. Practicing safe sex. Taking low-dose aspirin every day. Taking vitamin and  mineral supplements as recommended by your health care provider. What happens during an annual well check? The services and screenings done by your health care provider during your annual well check will depend on your age, overall health, lifestyle risk factors, and family history of disease. Counseling  Your health care provider may ask you questions about your: Alcohol use. Tobacco use. Drug use. Emotional well-being. Home and relationship well-being. Sexual activity. Eating habits. History of falls. Memory and ability to understand (cognition). Work and work Statistician. Reproductive health. Screening  You may have the following tests or measurements: Height, weight, and BMI. Blood pressure. Lipid and cholesterol levels. These may be checked every 5 years, or more frequently if you are over 62 years old. Skin check. Lung cancer screening. You may have this screening every year starting at age 37 if you have a 30-pack-year history of smoking and currently smoke or have quit within the past 15 years. Fecal occult blood test (FOBT) of the stool. You may have this test every year starting at age 21. Flexible sigmoidoscopy or colonoscopy. You may have a sigmoidoscopy every 5 years or a colonoscopy every 10 years starting at age 84. Hepatitis C blood test. Hepatitis B blood test. Sexually transmitted disease (STD) testing. Diabetes screening. This is done by checking your blood sugar (glucose) after you have not eaten for a while (fasting). You may have this done every 1-3  years. Bone density scan. This is done to screen for osteoporosis. You may have this done starting at age 22. Mammogram. This may be done every 1-2 years. Talk to your health care provider about how often you should have regular mammograms. Talk with your health care provider about your test results, treatment options, and if necessary, the need for more tests. Vaccines  Your health care provider may recommend  certain vaccines, such as: Influenza vaccine. This is recommended every year. Tetanus, diphtheria, and acellular pertussis (Tdap, Td) vaccine. You may need a Td booster every 10 years. Zoster vaccine. You may need this after age 14. Pneumococcal 13-valent conjugate (PCV13) vaccine. One dose is recommended after age 57. Pneumococcal polysaccharide (PPSV23) vaccine. One dose is recommended after age 74. Talk to your health care provider about which screenings and vaccines you need and how often you need them. This information is not intended to replace advice given to you by your health care provider. Make sure you discuss any questions you have with your health care provider. Document Released: 03/19/2015 Document Revised: 11/10/2015 Document Reviewed: 12/22/2014 Elsevier Interactive Patient Education  2017 [place] Prevention in the Home Falls can cause injuries. They can happen to people of all ages. There are many things you can do to make your home safe and to help prevent falls. What can I do on the outside of my home? Regularly fix the edges of walkways and driveways and fix any cracks. Remove anything that might make you trip as you walk through a door, such as a raised step or threshold. Trim any bushes or trees on the path to your home. Use bright outdoor lighting. Clear any walking paths of anything that might make someone trip, such as rocks or tools. Regularly check to see if handrails are loose or broken. Make sure that both sides of any steps have handrails. Any raised decks and porches should have guardrails on the edges. Have any leaves, snow, or ice cleared regularly. Use sand or salt on walking paths during winter. Clean up any spills in your garage right away. This includes oil or grease spills. What can I do in the bathroom? Use night lights. Install grab bars by the toilet and in the tub and shower. Do not use towel bars as grab bars. Use non-skid mats or  decals in the tub or shower. If you need to sit down in the shower, use a plastic, non-slip stool. Keep the floor dry. Clean up any water that spills on the floor as soon as it happens. Remove soap buildup in the tub or shower regularly. Attach bath mats securely with double-sided non-slip rug tape. Do not have throw rugs and other things on the floor that can make you trip. What can I do in the bedroom? Use night lights. Make sure that you have a light by your bed that is easy to reach. Do not use any sheets or blankets that are too big for your bed. They should not hang down onto the floor. Have a firm chair that has side arms. You can use this for support while you get dressed. Do not have throw rugs and other things on the floor that can make you trip. What can I do in the kitchen? Clean up any spills right away. Avoid walking on wet floors. Keep items that you use a lot in easy-to-reach places. If you need to reach something above you, use a strong step stool that has a grab  bar. Keep electrical cords out of the way. Do not use floor polish or wax that makes floors slippery. If you must use wax, use non-skid floor wax. Do not have throw rugs and other things on the floor that can make you trip. What can I do with my stairs? Do not leave any items on the stairs. Make sure that there are handrails on both sides of the stairs and use them. Fix handrails that are broken or loose. Make sure that handrails are as long as the stairways. Check any carpeting to make sure that it is firmly attached to the stairs. Fix any carpet that is loose or worn. Avoid having throw rugs at the top or bottom of the stairs. If you do have throw rugs, attach them to the floor with carpet tape. Make sure that you have a light switch at the top of the stairs and the bottom of the stairs. If you do not have them, ask someone to add them for you. What else can I do to help prevent falls? Wear shoes that: Do not  have high heels. Have rubber bottoms. Are comfortable and fit you well. Are closed at the toe. Do not wear sandals. If you use a stepladder: Make sure that it is fully opened. Do not climb a closed stepladder. Make sure that both sides of the stepladder are locked into place. Ask someone to hold it for you, if possible. Clearly mark and make sure that you can see: Any grab bars or handrails. First and last steps. Where the edge of each step is. Use tools that help you move around (mobility aids) if they are needed. These include: Canes. Walkers. Scooters. Crutches. Turn on the lights when you go into a dark area. Replace any light bulbs as soon as they burn out. Set up your furniture so you have a clear path. Avoid moving your furniture around. If any of your floors are uneven, fix them. If there are any pets around you, be aware of where they are. Review your medicines with your doctor. Some medicines can make you feel dizzy. This can increase your chance of falling. Ask your doctor what other things that you can do to help prevent falls. This information is not intended to replace advice given to you by your health care provider. Make sure you discuss any questions you have with your health care provider. Document Released: 12/17/2008 Document Revised: 07/29/2015 Document Reviewed: 03/27/2014 Elsevier Interactive Patient Education  2017 [employer].

## 2022-02-10 ENCOUNTER — Ambulatory Visit: Payer: Medicare PPO | Admitting: Anesthesiology

## 2022-02-10 ENCOUNTER — Encounter: Payer: Self-pay | Admitting: Gastroenterology

## 2022-02-10 ENCOUNTER — Encounter
Admission: RE | Disposition: A | Discharge status: Home / Self Care | Payer: Self-pay | Source: Home / Self Care | Attending: Gastroenterology

## 2022-02-10 ENCOUNTER — Ambulatory Visit
Admission: RE | Admit: 2022-02-10 | Discharge: 2022-02-10 | Disposition: A | Discharge status: Home / Self Care | Payer: Medicare PPO | Attending: Gastroenterology | Admitting: Gastroenterology

## 2022-02-10 DIAGNOSIS — H8109 Meniere's disease, unspecified ear: Secondary | ICD-10-CM | POA: Diagnosis not present

## 2022-02-10 DIAGNOSIS — Z1211 Encounter for screening for malignant neoplasm of colon: Secondary | ICD-10-CM | POA: Insufficient documentation

## 2022-02-10 DIAGNOSIS — K635 Polyp of colon: Secondary | ICD-10-CM

## 2022-02-10 DIAGNOSIS — D124 Benign neoplasm of descending colon: Secondary | ICD-10-CM | POA: Diagnosis not present

## 2022-02-10 DIAGNOSIS — Z860100 Personal history of colon polyps, unspecified: Secondary | ICD-10-CM

## 2022-02-10 DIAGNOSIS — K5790 Diverticulosis of intestine, part unspecified, without perforation or abscess without bleeding: Secondary | ICD-10-CM | POA: Diagnosis not present

## 2022-02-10 DIAGNOSIS — E785 Hyperlipidemia, unspecified: Secondary | ICD-10-CM | POA: Diagnosis not present

## 2022-02-10 HISTORY — PX: COLONOSCOPY WITH PROPOFOL: SHX5780

## 2022-02-10 SURGERY — COLONOSCOPY WITH PROPOFOL
Anesthesia: General

## 2022-02-10 MED ORDER — PROPOFOL 500 MG/50ML IV EMUL
INTRAVENOUS | Status: DC | PRN
  Administered 2022-02-10: 120 ug/kg/min via INTRAVENOUS

## 2022-02-10 MED ORDER — STERILE WATER FOR IRRIGATION IR SOLN
Status: DC | PRN
  Administered 2022-02-10: 50 mL

## 2022-02-10 MED ORDER — PROPOFOL 10 MG/ML IV BOLUS
INTRAVENOUS | Status: DC | PRN
  Administered 2022-02-10: 30 mg via INTRAVENOUS
  Administered 2022-02-10: 70 mg via INTRAVENOUS

## 2022-02-10 MED ORDER — LIDOCAINE 2% (20 MG/ML) 5 ML SYRINGE
INTRAMUSCULAR | Status: DC | PRN
  Administered 2022-02-10: 20 mg via INTRAVENOUS

## 2022-02-10 MED ORDER — SODIUM CHLORIDE 0.9 % IV SOLN: INTRAVENOUS | Status: DC

## 2022-02-10 NOTE — Transfer of Care (Signed)
Immediate Anesthesia Transfer of Care Note  Patient: Vanessa Walton  Procedure(s) Performed: COLONOSCOPY WITH PROPOFOL  Patient Location: Endoscopy Unit  Anesthesia Type:General  Level of Consciousness: awake, alert , and oriented  Airway & Oxygen Therapy: Patient Spontanous Breathing  Post-op Assessment: Report given to RN and Post -op Vital signs reviewed and stable  Post vital signs: Reviewed  Last Vitals:  Vitals Value Taken Time  BP    Temp    Pulse 70 02/10/22 1023  Resp 15 02/10/22 1023  SpO2 100 % 02/10/22 1023  Vitals shown include unvalidated device data.  Last Pain:  Vitals:   02/10/22 1022  TempSrc:   PainSc: 0-No pain         Complications: No notable events documented.

## 2022-02-10 NOTE — H&P (Signed)
Vanessa Darby, MD [address]  Vanessa Walton  [place], [place] [address]  Main: [phone]  Fax: [phone] Pager: [phone]  Primary Care Physician:  [name], FNP Primary Gastroenterologist:  Dr. Cephas Walton  Pre-Procedure History & Physical: HPI:  Vanessa Walton is a 71 y.o. female is here for an colonoscopy.   Past Medical History:  Diagnosis Date   Arthritis    right index finger   Basal cell carcinoma 02/24/2020   L nasal dorsum, MOHs 04/15/20   Dysplastic nevus 12/27/2020   Spinal mid upper back, Severe atypia, excised 03/14/2021   Dysplastic nevus 12/27/2020   Left Lower Back, severe atypia, EXC 03/21/2021   GERD (gastroesophageal reflux disease)    Herpes simplex virus (HSV) infection    Hyperlipidemia    Meniere disease 02/13/2019   Meniere's disease     Past Surgical History:  Procedure Laterality Date   CESAREAN SECTION     COLONOSCOPY WITH PROPOFOL N/A 08/22/2017   Procedure: COLONOSCOPY WITH PROPOFOL;  Surgeon: Lin Landsman, MD;  Location: [facility];  Service: Endoscopy;  Laterality: N/A;   POLYPECTOMY  08/22/2017   Procedure: POLYPECTOMY;  Surgeon: Lin Landsman, MD;  Location: [facility];  Service: Endoscopy;;   TONSILLECTOMY      Prior to Admission medications   Medication Sig Start Date End Date Taking? Authorizing Provider  B Complex Vitamins (B COMPLEX PO) Take by mouth daily.    [provider]  Biotin 10000 MCG TABS Take by mouth.    [provider]  cetirizine (ZYRTEC) 10 MG tablet Take 1 tablet (10 mg total) by mouth daily. 12/08/21   [name], FNP  Cholecalciferol (VITAMIN D3) 50 MCG (2000 UT) TABS Take by mouth.    [provider]  clobetasol cream (TEMOVATE) 9.62 % Apply 1 Application topically 2 (two) times daily. 12/15/21   [name], FNP  fluticasone (FLOVENT HFA) 110 MCG/ACT inhaler Inhale 1 puff into the lungs 2 (two) times daily. 12/08/21   [name], FNP  GLUCOSAMINE CHONDROITIN COMPLX PO Take by mouth daily.    [provider]  Magnesium 400 MG TABS Take by mouth.    [provider]  montelukast (SINGULAIR) 10 MG tablet Take 1 tablet (10 mg total) by mouth at bedtime. 12/08/21   [name], FNP  Omega-3 Fatty Acids (FISH OIL DOUBLE STRENGTH) 1200 MG CAPS Take by mouth 2 (two) times daily.    [provider]  Probiotic Product (PROBIOTIC DAILY PO) Take by mouth.    [provider]  rosuvastatin (CRESTOR) 10 MG tablet Take 1 tablet (10 mg total) by mouth at bedtime. 10/25/21 04/23/22  [name], FNP  Spacer/Aero-Holding [name] DEVI Use spacer with use of flovent inhaler which is one puff twice daily 12/08/21   [name], FNP  valACYclovir (VALTREX) 1000 MG tablet Take 1 tablet (1,000 mg total) by mouth daily. For 5 days. May repeat if needed 02/18/20   [name], MD  vitamin E 400 UNIT capsule Take 400 Units by mouth daily.    [provider]  XIIDRA 5 % SOLN  10/27/19   [provider]    Allergies as of 01/02/2022 - Review Complete 12/28/2021  Allergen Reaction Noted   Astelin [azelastine] Other (See Comments) 10/21/2021   Flonase [fluticasone] Other (See Comments) 10/21/2021    Family History  Problem Relation Age of Onset   Arthritis Mother    Hyperlipidemia Mother    Hyperlipidemia  Father    Breast cancer Neg Hx     Social History   Socioeconomic History   Marital status: Married    Spouse name: Not on file   Number of children: 2   Years of education: Not on file   Highest education level: Not on file  Occupational History   Occupation: retired    Fish farm manager: OTHER  Tobacco Use   Smoking status: Never   Smokeless tobacco: Never   Tobacco comments:    smoked socially as teenager  Vaping Use   Vaping Use: Never used  Substance and Sexual Activity   Alcohol use: Yes    Alcohol/week: 6.0 standard drinks of alcohol    Types: 6  Glasses of wine per week    Comment: moderate   Drug use: No   Sexual activity: Yes    Partners: Male    Birth control/protection: None  Other Topics Concern   Not on file  Social History Narrative   Age 10 daughter np school   Age 89 female   Social Determinants of Health   Financial Resource Strain: Low Risk  (02/07/2022)   Overall Financial Resource Strain (CARDIA)    Difficulty of Paying Living Expenses: Not hard at all  Food Insecurity: No Food Insecurity (02/07/2022)   Hunger Vital Sign    Worried About Running Out of Food in the Last Year: Never true    [place] in the Last Year: Never true  Transportation Needs: No Transportation Needs (02/07/2022)   PRAPARE - Hydrologist (Medical): No    Lack of Transportation (Non-Medical): No  Physical Activity: Sufficiently Active (02/07/2022)   Exercise Vital Sign    Days of Exercise per Week: 6 days    Minutes of Exercise per Session: 70 min  Stress: No Stress Concern Present (02/07/2022)   [place]    Feeling of Stress : Not at all  Social Connections: [place] (02/07/2022)   Social Connection and Isolation Panel [NHANES]    Frequency of Communication with Friends and Family: More than three times a week    Frequency of Social Gatherings with Friends and Family: More than three times a week    Attends Religious Services: More than 4 times per year    Active Member of [employer] or Organizations: Yes    Attends Music therapist: More than 4 times per year    Marital Status: Married  Human resources officer Violence: Not At Risk (02/07/2022)   Humiliation, Afraid, Rape, and Kick questionnaire    Fear of Current or Ex-Partner: No    Emotionally Abused: No    Physically Abused: No    Sexually Abused: No    Review of Systems: See HPI, otherwise negative ROS  Physical Exam: BP 113/82   Pulse 71   Temp (!) 97.5 F  (36.4 C) (Temporal)   Resp 16   Ht 4' 10.5" (1.486 m)   Wt 62.1 kg   SpO2 100%   BMI 28.15 kg/m  General:   Alert,  pleasant and cooperative in NAD Head:  Normocephalic and atraumatic. Neck:  Supple; no masses or thyromegaly. Lungs:  Clear throughout to auscultation.    Heart:  Regular rate and rhythm. Abdomen:  Soft, nontender and nondistended. Normal bowel sounds, without guarding, and without rebound.   Neurologic:  Alert and  oriented x4;  grossly normal neurologically.  Impression/Plan: Onetta Spainhower is here for an  colonoscopy to be performed for h/o colon polyps  Risks, benefits, limitations, and alternatives regarding  colonoscopy have been reviewed with the patient.  Questions have been answered.  All parties agreeable.   Sherri Sear, MD  02/10/2022, 9:58 AM

## 2022-02-10 NOTE — Anesthesia Postprocedure Evaluation (Signed)
Anesthesia Post Note  Patient: Vanessa Walton  Procedure(s) Performed: COLONOSCOPY WITH PROPOFOL  Patient location during evaluation: PACU Anesthesia Type: General Level of consciousness: awake and alert, oriented and patient cooperative Pain management: pain level controlled Vital Signs Assessment: post-procedure vital signs reviewed and stable Respiratory status: spontaneous breathing, nonlabored ventilation and respiratory function stable Cardiovascular status: blood pressure returned to baseline and stable Postop Assessment: adequate PO intake Anesthetic complications: no   No notable events documented.   Last Vitals:  Vitals:   02/10/22 0932 02/10/22 1023  BP: 113/82 121/60  Pulse: 71   Resp: 16   Temp: (!) 36.4 C   SpO2: 100%     Last Pain:  Vitals:   02/10/22 1032  TempSrc:   PainSc: 0-No pain                 Darrin Nipper

## 2022-02-10 NOTE — Op Note (Signed)
[facility] Gastroenterology Patient Name: Vanessa Walton Procedure Date: 02/10/2022 9:51 AM MRN: [id number] Account #: [id number] Date of Birth: 1950/07/29 Admit Type: Outpatient Age: 71 Room: [facility] ENDO ROOM 2 Gender: Female Note Status: Finalized Instrument Name: Peds Colonoscope [id number] Procedure:             Colonoscopy Indications:           Surveillance: Personal history of adenomatous polyps                         on last colonoscopy 3 years ago, Last colonoscopy:                         June 2019 Providers:             Lin Landsman MD, MD Referring MD:          [name] (Referring MD) Medicines:             General Anesthesia Complications:         No immediate complications. Estimated blood loss: None. Procedure:             Pre-Anesthesia Assessment:                        - Prior to the procedure, a History and Physical was                         performed, and patient medications and allergies were                         reviewed. The patient is competent. The risks and                         benefits of the procedure and the sedation options and                         risks were discussed with the patient. All questions                         were answered and informed consent was obtained.                         Patient identification and proposed procedure were                         verified by the physician, the nurse, the                         anesthesiologist, the anesthetist and the technician                         in the pre-procedure area in the procedure room in the                         endoscopy suite. Mental Status Examination: alert and                         oriented. Airway Examination: normal oropharyngeal  airway and neck mobility. Respiratory Examination:                         clear to auscultation. CV Examination: normal.                         Prophylactic Antibiotics: The patient  does not require                         prophylactic antibiotics. Prior Anticoagulants: The                         patient has taken no anticoagulant or antiplatelet                         agents. ASA Grade Assessment: II - A patient with mild                         systemic disease. After reviewing the risks and                         benefits, the patient was deemed in satisfactory                         condition to undergo the procedure. The anesthesia                         plan was to use general anesthesia. Immediately prior                         to administration of medications, the patient was                         re-assessed for adequacy to receive sedatives. The                         heart rate, respiratory rate, oxygen saturations,                         blood pressure, adequacy of pulmonary ventilation, and                         response to care were monitored throughout the                         procedure. The physical status of the patient was                         re-assessed after the procedure.                        After obtaining informed consent, the colonoscope was                         passed under direct vision. Throughout the procedure,                         the patient's blood pressure, pulse, and oxygen  saturations were monitored continuously. The                         Colonoscope was introduced through the anus and                         advanced to the the cecum, identified by appendiceal                         orifice and ileocecal valve. The colonoscopy was                         performed without difficulty. The patient tolerated                         the procedure well. The quality of the bowel                         preparation was evaluated using the BBPS [facility] Bowel                         Preparation Scale) with scores of: Right Colon = 3,                         Transverse Colon = 3 and Left  Colon = 3 (entire mucosa                         seen well with no residual staining, small fragments                         of stool or opaque liquid). The total BBPS score                         equals 9. The ileocecal valve, appendiceal orifice,                         and rectum were photographed. Findings:      The perianal and digital rectal examinations were normal. Pertinent       negatives include normal sphincter tone and no palpable rectal lesions.      A 6 mm polyp was found in the descending colon. The polyp was sessile.       The polyp was removed with a cold snare. Resection and retrieval were       complete. Estimated blood loss: none.      The retroflexed view of the distal rectum and anal verge was normal and       showed no anal or rectal abnormalities.      A post polypectomy scar was found in the ascending colon. The scar       tissue was healthy in appearance. There was no evidence of the previous       polyp. Impression:            - One 6 mm polyp in the descending colon, removed with                         a cold snare. Resected and retrieved.                        -  The distal rectum and anal verge are normal on                         retroflexion view. Recommendation:        - Discharge patient to home (with escort).                        - Resume previous diet today.                        - Continue present medications.                        - Await pathology results.                        - Repeat colonoscopy in 5 years for surveillance based                         on pathology results. Procedure Code(s):     --- Professional ---                        [id number], Colonoscopy, flexible; with removal of                         tumor(s), polyp(s), or other lesion(s) by snare                         technique Diagnosis Code(s):     --- Professional ---                        Z86.010, Personal history of colonic polyps                        D12.4, Benign  neoplasm of descending colon CPT copyright 2022 American Medical Association. All rights reserved. The codes documented in this report are preliminary and upon coder review may  be revised to meet current compliance requirements. Dr. Ulyess Mort Lin Landsman MD, MD 02/10/2022 10:26:55 AM This report has been signed electronically. Number of Addenda: 0 Note Initiated On: 02/10/2022 9:51 AM Scope Withdrawal Time: 0 hours 13 minutes 57 seconds  Total Procedure Duration: 0 hours 16 minutes 55 seconds  Estimated Blood Loss:  Estimated blood loss: none. Estimated blood loss: none.      [facility]

## 2022-02-10 NOTE — Anesthesia Preprocedure Evaluation (Addendum)
Anesthesia Evaluation  Patient identified by MRN, date of birth, ID band Patient awake    Reviewed: Allergy & Precautions, NPO status , Patient's Chart, lab work & pertinent test results  History of Anesthesia Complications Negative for: history of anesthetic complications  Airway Mallampati: III   Neck ROM: Full    Dental no notable dental hx.    Pulmonary neg pulmonary ROS   Pulmonary exam normal breath sounds clear to auscultation       Cardiovascular Exercise Tolerance: Good negative cardio ROS Normal cardiovascular exam Rhythm:Regular Rate:Normal     Neuro/Psych Meniere disease    GI/Hepatic ,GERD  ,,  Endo/Other  negative endocrine ROS    Renal/GU negative Renal ROS     Musculoskeletal  (+) Arthritis ,    Abdominal   Peds  Hematology negative hematology ROS (+)   Anesthesia Other Findings   Reproductive/Obstetrics                             Anesthesia Physical Anesthesia Plan  ASA: 2  Anesthesia Plan: General   Post-op Pain Management:    Induction: Intravenous  PONV Risk Score and Plan: 3 and Propofol infusion, TIVA and Treatment may vary due to age or medical condition  Airway Management Planned: Natural Airway  Additional Equipment:   Intra-op Plan:   Post-operative Plan:   Informed Consent: I have reviewed the patients History and Physical, chart, labs and discussed the procedure including the risks, benefits and alternatives for the proposed anesthesia with the patient or authorized representative who has indicated his/her understanding and acceptance.       Plan Discussed with: CRNA  Anesthesia Plan Comments: (LMA/GETA backup discussed.  Patient consented for risks of anesthesia including but not limited to:  - adverse reactions to medications - damage to eyes, teeth, lips or other oral mucosa - nerve damage due to positioning  - sore throat or  hoarseness - damage to heart, brain, nerves, lungs, other parts of body or loss of life  Informed patient about role of CRNA in peri- and intra-operative care.  Patient voiced understanding.)       Anesthesia Quick Evaluation

## 2022-02-13 ENCOUNTER — Encounter: Payer: Self-pay | Admitting: Gastroenterology

## 2022-02-13 LAB — SURGICAL PATHOLOGY

## 2022-02-14 ENCOUNTER — Encounter: Payer: Self-pay | Admitting: Gastroenterology

## 2022-03-06 ENCOUNTER — Other Ambulatory Visit: Payer: Self-pay | Admitting: Family

## 2022-03-06 DIAGNOSIS — J209 Acute bronchitis, unspecified: Secondary | ICD-10-CM

## 2022-03-08 DIAGNOSIS — M9901 Segmental and somatic dysfunction of cervical region: Secondary | ICD-10-CM | POA: Diagnosis not present

## 2022-03-08 DIAGNOSIS — M9905 Segmental and somatic dysfunction of pelvic region: Secondary | ICD-10-CM | POA: Diagnosis not present

## 2022-03-08 DIAGNOSIS — M9902 Segmental and somatic dysfunction of thoracic region: Secondary | ICD-10-CM | POA: Diagnosis not present

## 2022-03-08 DIAGNOSIS — M9903 Segmental and somatic dysfunction of lumbar region: Secondary | ICD-10-CM | POA: Diagnosis not present

## 2022-03-13 DIAGNOSIS — H2513 Age-related nuclear cataract, bilateral: Secondary | ICD-10-CM | POA: Diagnosis not present

## 2022-03-13 DIAGNOSIS — Z0100 Encounter for examination of eyes and vision without abnormal findings: Secondary | ICD-10-CM | POA: Diagnosis not present

## 2022-03-14 DIAGNOSIS — Z0100 Encounter for examination of eyes and vision without abnormal findings: Secondary | ICD-10-CM | POA: Diagnosis not present

## 2022-03-14 DIAGNOSIS — H2513 Age-related nuclear cataract, bilateral: Secondary | ICD-10-CM | POA: Diagnosis not present

## 2022-03-22 DIAGNOSIS — M9902 Segmental and somatic dysfunction of thoracic region: Secondary | ICD-10-CM | POA: Diagnosis not present

## 2022-03-22 DIAGNOSIS — M9903 Segmental and somatic dysfunction of lumbar region: Secondary | ICD-10-CM | POA: Diagnosis not present

## 2022-03-22 DIAGNOSIS — M9905 Segmental and somatic dysfunction of pelvic region: Secondary | ICD-10-CM | POA: Diagnosis not present

## 2022-03-22 DIAGNOSIS — M9901 Segmental and somatic dysfunction of cervical region: Secondary | ICD-10-CM | POA: Diagnosis not present

## 2022-04-05 DIAGNOSIS — M9905 Segmental and somatic dysfunction of pelvic region: Secondary | ICD-10-CM | POA: Diagnosis not present

## 2022-04-05 DIAGNOSIS — M9903 Segmental and somatic dysfunction of lumbar region: Secondary | ICD-10-CM | POA: Diagnosis not present

## 2022-04-05 DIAGNOSIS — M9902 Segmental and somatic dysfunction of thoracic region: Secondary | ICD-10-CM | POA: Diagnosis not present

## 2022-04-05 DIAGNOSIS — M9901 Segmental and somatic dysfunction of cervical region: Secondary | ICD-10-CM | POA: Diagnosis not present

## 2022-04-18 ENCOUNTER — Other Ambulatory Visit: Payer: Self-pay | Admitting: Family

## 2022-05-01 DIAGNOSIS — M9903 Segmental and somatic dysfunction of lumbar region: Secondary | ICD-10-CM | POA: Diagnosis not present

## 2022-05-01 DIAGNOSIS — M9905 Segmental and somatic dysfunction of pelvic region: Secondary | ICD-10-CM | POA: Diagnosis not present

## 2022-05-01 DIAGNOSIS — M9901 Segmental and somatic dysfunction of cervical region: Secondary | ICD-10-CM | POA: Diagnosis not present

## 2022-05-01 DIAGNOSIS — M9902 Segmental and somatic dysfunction of thoracic region: Secondary | ICD-10-CM | POA: Diagnosis not present

## 2022-05-10 ENCOUNTER — Encounter: Payer: Self-pay | Admitting: Gastroenterology

## 2022-05-10 ENCOUNTER — Ambulatory Visit: Payer: Medicare PPO | Admitting: Gastroenterology

## 2022-05-10 VITALS — BP 129/76 | HR 79 | Temp 98.1°F | Ht 58.5 in | Wt 144.4 lb

## 2022-05-10 DIAGNOSIS — K760 Fatty (change of) liver, not elsewhere classified: Secondary | ICD-10-CM

## 2022-05-10 DIAGNOSIS — R7989 Other specified abnormal findings of blood chemistry: Secondary | ICD-10-CM

## 2022-05-10 NOTE — Progress Notes (Signed)
[name], MD [address]  [place]  [place], [place] [address]  Main: [phone]  Fax: [phone]    Gastroenterology Consultation  Referring Provider:     [name], FNP Primary Care Physician:  [name], FNP Primary Gastroenterologist:  Dr. [name] Reason for Consultation: Fatty liver, chronically elevated LFTs        HPI:   Vanessa Walton is a 72 y.o. female referred by [name], [place]  for consultation & management of chronically elevated LFTs.  Patient is found to have mildly elevated transaminases since 2015.  Head CT antibody was negative.  No further workup was pursued.  Recently, she underwent abdominal ultrasound by her PCP in October 2023 for elevated LFTs, which revealed normal liver, pancreas as well as gallbladder.  She had cystic lesion in bilateral kidneys, therefore underwent CT renal abdomen with and without contrast, did not reveal any renal lesions.  However, she was found to have severely decreased attenuation and is enlarged consistent with steatotic enlarged liver.  Normal gallbladder.  Patient reports that she thought her elevated liver enzymes are secondary to behavior of consumption of alcohol.  She is to consume 2 glasses of wine daily which she significantly cut down to 2-3 times a week only.  She just returned from [place] after a vacation She does consume red meat few times a week.  She stays active, walks 5 days a week about 4 miles a day She denies any family history of known chronic liver disease  NSAIDs: None  Antiplts/Anticoagulants/Anti thrombotics: None  GI Procedures:  Colonoscopy 02/2022 1 subcentimeter polyp was removed DIAGNOSIS:  A. DESCENDING COLON, "POLYP"; COLD SNARE:  - FRAGMENTS OF HYPERPLASTIC POLYP.  - NEGATIVE FOR DYSPLASIA AND MALIGNANCY.   Past Medical History:  Diagnosis Date   Arthritis    right index finger   Basal cell carcinoma 02/24/2020   L nasal dorsum, MOHs 04/15/20    Dysplastic nevus 12/27/2020   Spinal mid upper back, Severe atypia, excised 03/14/2021   Dysplastic nevus 12/27/2020   Left Lower Back, severe atypia, EXC 03/21/2021   GERD (gastroesophageal reflux disease)    Herpes simplex virus (HSV) infection    Hyperlipidemia    Meniere disease 02/13/2019   Meniere's disease     Past Surgical History:  Procedure Laterality Date   CESAREAN SECTION     COLONOSCOPY WITH PROPOFOL N/A 08/22/2017   Procedure: COLONOSCOPY WITH PROPOFOL;  Surgeon: Lin Landsman, MD;  Location: [facility];  Service: Endoscopy;  Laterality: N/A;   COLONOSCOPY WITH PROPOFOL N/A 02/10/2022   Procedure: COLONOSCOPY WITH PROPOFOL;  Surgeon: Lin Landsman, MD;  Location: [facility] ENDOSCOPY;  Service: Gastroenterology;  Laterality: N/A;   POLYPECTOMY  08/22/2017   Procedure: POLYPECTOMY;  Surgeon: Lin Landsman, MD;  Location: [facility];  Service: Endoscopy;;   TONSILLECTOMY       Current Outpatient Medications:    B Complex Vitamins (B COMPLEX PO), Take by mouth daily., Disp: , Rfl:    Biotin 10000 MCG TABS, Take by mouth., Disp: , Rfl:    cetirizine (ZYRTEC) 10 MG tablet, Take 1 tablet (10 mg total) by mouth daily., Disp: 30 tablet, Rfl: 11   Cholecalciferol (VITAMIN D3) 50 MCG (2000 UT) TABS, Take by mouth., Disp: , Rfl:    GLUCOSAMINE CHONDROITIN COMPLX PO, Take by mouth daily., Disp: , Rfl:    Omega-3 Fatty Acids (FISH OIL DOUBLE STRENGTH) 1200 MG CAPS, Take by mouth 2 (two)  times daily., Disp: , Rfl:    Probiotic Product (PROBIOTIC DAILY PO), Take by mouth., Disp: , Rfl:    rosuvastatin (CRESTOR) 10 MG tablet, TAKE 1 TABLET BY MOUTH EVERYDAY AT BEDTIME, Disp: 90 tablet, Rfl: 1   valACYclovir (VALTREX) 1000 MG tablet, Take 1 tablet (1,000 mg total) by mouth daily. For 5 days. May repeat if needed, Disp: 30 tablet, Rfl: 2   vitamin E 400 UNIT capsule, Take 400 Units by mouth daily., Disp: , Rfl:    XIIDRA 5 % SOLN, , Disp: , Rfl:    Family  History  Problem Relation Age of Onset   Arthritis Mother    Hyperlipidemia Mother    Hyperlipidemia Father    Breast cancer Neg Hx      Social History   Tobacco Use   Smoking status: Never   Smokeless tobacco: Never   Tobacco comments:    smoked socially as teenager  Vaping Use   Vaping Use: Never used  Substance Use Topics   Alcohol use: Yes    Alcohol/week: 6.0 standard drinks of alcohol    Types: 6 Glasses of wine per week    Comment: moderate   Drug use: No    Allergies as of 05/10/2022 - Review Complete 05/10/2022  Allergen Reaction Noted   Astelin [azelastine] Other (See Comments) 10/21/2021   Flonase [fluticasone] Other (See Comments) 10/21/2021    Review of Systems:    All systems reviewed and negative except where noted in HPI.   Physical Exam:  BP 129/76   Pulse 79   Temp 98.1 F (36.7 C) (Oral)   Ht 4' 10.5" (1.486 m)   Wt 144 lb 6 oz (65.5 kg)   BMI 29.66 kg/m  No LMP recorded. Patient is postmenopausal.  General:   Alert,  Well-developed, well-nourished, pleasant and cooperative in NAD Head:  Normocephalic and atraumatic. Eyes:  Sclera clear, no icterus.   Conjunctiva pink. Ears:  Normal auditory acuity. Nose:  No deformity, discharge, or lesions. Mouth:  No deformity or lesions,oropharynx pink & moist. Neck:  Supple; no masses or thyromegaly. Lungs:  Respirations even and unlabored.  Clear throughout to auscultation.   No wheezes, crackles, or rhonchi. No acute distress. Heart:  Regular rate and rhythm; no murmurs, clicks, rubs, or gallops. Abdomen:  Normal bowel sounds. Soft, non-tender and non-distended without masses, hepatosplenomegaly or hernias noted.  No guarding or rebound tenderness.   Rectal: Not performed Msk:  Symmetrical without gross deformities. Good, equal movement & strength bilaterally. Pulses:  Normal pulses noted. Extremities:  No clubbing or edema.  No cyanosis. Neurologic:  Alert and oriented x3;  grossly normal  neurologically. Skin:  Intact without significant lesions or rashes. No jaundice. Psych:  Alert and cooperative. Normal mood and affect.  Imaging Studies: Reviewed  Assessment and Plan:   Vanessa Walton is a 72 y.o. pleasant Caucasian female with hyperlipidemia, no other significant past medical history is seen in consultation for chronically elevated transaminases for about 10 years.  Recent imaging revealed steatosis with hepatomegaly.  Patient has significantly cut down on drinking wine from 2 glasses/day to 2 to 3 glasses/week  Recommend secondary liver disease workup Based on the results, discussed with patient regarding the possibility of ultrasound-guided liver biopsy Discussed about fatty liver disease, lifestyle modification, long-term implications   Follow up in 6 months   [name], MD

## 2022-05-12 LAB — HEPATITIS A ANTIBODY, TOTAL: hep A Total Ab: NEGATIVE

## 2022-05-12 LAB — IRON,TIBC AND FERRITIN PANEL
Ferritin: 283 ng/mL — ABNORMAL HIGH (ref 15–150)
Iron Saturation: 17 % (ref 15–55)
Iron: 49 ug/dL (ref 27–139)
Total Iron Binding Capacity: 296 ug/dL (ref 250–450)
UIBC: 247 ug/dL (ref 118–369)

## 2022-05-12 LAB — NASH FIBROSURE(R) PLUS
ALPHA 2-MACROGLOBULINS, QN: 228 mg/dL (ref 110–276)
ALT (SGPT) P5P: 28 IU/L (ref 0–40)
AST (SGOT) P5P: 27 IU/L (ref 0–40)
Apolipoprotein A-1: 210 mg/dL — ABNORMAL HIGH (ref 116–209)
Bilirubin, Total: 0.2 mg/dL (ref 0.0–1.2)
Cholesterol, Total: 217 mg/dL — ABNORMAL HIGH (ref 100–199)
Fibrosis Score: 0.09 (ref 0.00–0.21)
GGT: 20 IU/L (ref 0–60)
Glucose: 122 mg/dL — ABNORMAL HIGH (ref 70–99)
Haptoglobin: 118 mg/dL (ref 42–346)
NASH Score: 0.6 — ABNORMAL HIGH (ref 0.00–0.25)
Steatosis Score: 0.57 — ABNORMAL HIGH (ref 0.00–0.40)
Triglycerides: 308 mg/dL — ABNORMAL HIGH (ref 0–149)

## 2022-05-12 LAB — CELIAC DISEASE PANEL
Endomysial IgA: NEGATIVE
IgA/Immunoglobulin A, Serum: 92 mg/dL (ref 64–422)
Transglutaminase IgA: <2 U/mL (ref 0–3)

## 2022-05-12 LAB — CERULOPLASMIN: Ceruloplasmin: 23.3 mg/dL (ref 19.0–39.0)

## 2022-05-12 LAB — HEPATIC FUNCTION PANEL
ALT: 23 IU/L (ref 0–32)
AST: 25 IU/L (ref 0–40)
Albumin: 4.7 g/dL (ref 3.8–4.8)
Alkaline Phosphatase: 43 IU/L — ABNORMAL LOW (ref 44–121)
Bilirubin Total: 0.2 mg/dL (ref 0.0–1.2)
Bilirubin, Direct: <0.1 mg/dL (ref 0.00–0.40)
Total Protein: 6.8 g/dL (ref 6.0–8.5)

## 2022-05-12 LAB — HEPATITIS B SURFACE ANTIGEN: Hepatitis B Surface Ag: NEGATIVE

## 2022-05-12 LAB — ANA: Anti Nuclear Antibody (ANA): POSITIVE — AB

## 2022-05-12 LAB — ALPHA-1-ANTITRYPSIN: A-1 Antitrypsin: 124 mg/dL (ref 101–187)

## 2022-05-12 LAB — HEPATITIS B SURFACE ANTIBODY,QUALITATIVE: Hep B Surface Ab, Qual: REACTIVE

## 2022-05-12 LAB — HEPATITIS B CORE ANTIBODY, TOTAL: Hep B Core Total Ab: NEGATIVE

## 2022-05-12 LAB — MITOCHONDRIAL ANTIBODIES: Mitochondrial Ab: <20 Units (ref 0.0–20.0)

## 2022-05-12 LAB — ANTI-SMOOTH MUSCLE ANTIBODY, IGG: Smooth Muscle Ab: 11 Units (ref 0–19)

## 2022-05-13 ENCOUNTER — Encounter: Payer: Self-pay | Admitting: Gastroenterology

## 2022-05-22 DIAGNOSIS — M9903 Segmental and somatic dysfunction of lumbar region: Secondary | ICD-10-CM | POA: Diagnosis not present

## 2022-05-22 DIAGNOSIS — M9901 Segmental and somatic dysfunction of cervical region: Secondary | ICD-10-CM | POA: Diagnosis not present

## 2022-05-22 DIAGNOSIS — M9902 Segmental and somatic dysfunction of thoracic region: Secondary | ICD-10-CM | POA: Diagnosis not present

## 2022-05-22 DIAGNOSIS — M9905 Segmental and somatic dysfunction of pelvic region: Secondary | ICD-10-CM | POA: Diagnosis not present

## 2022-06-14 DIAGNOSIS — M9903 Segmental and somatic dysfunction of lumbar region: Secondary | ICD-10-CM | POA: Diagnosis not present

## 2022-06-14 DIAGNOSIS — M9905 Segmental and somatic dysfunction of pelvic region: Secondary | ICD-10-CM | POA: Diagnosis not present

## 2022-06-14 DIAGNOSIS — M9902 Segmental and somatic dysfunction of thoracic region: Secondary | ICD-10-CM | POA: Diagnosis not present

## 2022-06-14 DIAGNOSIS — M9901 Segmental and somatic dysfunction of cervical region: Secondary | ICD-10-CM | POA: Diagnosis not present

## 2022-06-21 ENCOUNTER — Encounter: Payer: Self-pay | Admitting: Family

## 2022-06-21 DIAGNOSIS — J302 Other seasonal allergic rhinitis: Secondary | ICD-10-CM

## 2022-06-21 MED ORDER — FLUTICASONE PROPIONATE HFA 110 MCG/ACT IN AERO: 1.0000 | INHALATION_SPRAY | Freq: Two times a day (BID) | RESPIRATORY_TRACT | 12 refills | Status: DC

## 2022-06-21 NOTE — Telephone Encounter (Signed)
Refill for fluticasone (FLOVENT HFA) 110 MCG/ACT inhaler. See Mychart message.  LV- 12/08/21 NV- 02/12/23

## 2022-07-03 ENCOUNTER — Other Ambulatory Visit: Payer: Self-pay | Admitting: Family

## 2022-07-03 DIAGNOSIS — Z1231 Encounter for screening mammogram for malignant neoplasm of breast: Secondary | ICD-10-CM

## 2022-07-04 ENCOUNTER — Ambulatory Visit: Payer: Medicare PPO | Admitting: Dermatology

## 2022-07-04 ENCOUNTER — Encounter: Payer: Self-pay | Admitting: Dermatology

## 2022-07-04 VITALS — BP 130/76 | HR 75

## 2022-07-04 DIAGNOSIS — Z85828 Personal history of other malignant neoplasm of skin: Secondary | ICD-10-CM

## 2022-07-04 DIAGNOSIS — L578 Other skin changes due to chronic exposure to nonionizing radiation: Secondary | ICD-10-CM

## 2022-07-04 DIAGNOSIS — L918 Other hypertrophic disorders of the skin: Secondary | ICD-10-CM | POA: Diagnosis not present

## 2022-07-04 DIAGNOSIS — L219 Seborrheic dermatitis, unspecified: Secondary | ICD-10-CM

## 2022-07-04 DIAGNOSIS — L719 Rosacea, unspecified: Secondary | ICD-10-CM | POA: Diagnosis not present

## 2022-07-04 DIAGNOSIS — Z1283 Encounter for screening for malignant neoplasm of skin: Secondary | ICD-10-CM | POA: Diagnosis not present

## 2022-07-04 DIAGNOSIS — L720 Epidermal cyst: Secondary | ICD-10-CM

## 2022-07-04 DIAGNOSIS — Z86018 Personal history of other benign neoplasm: Secondary | ICD-10-CM

## 2022-07-04 DIAGNOSIS — D225 Melanocytic nevi of trunk: Secondary | ICD-10-CM

## 2022-07-04 DIAGNOSIS — D2239 Melanocytic nevi of other parts of face: Secondary | ICD-10-CM

## 2022-07-04 DIAGNOSIS — L814 Other melanin hyperpigmentation: Secondary | ICD-10-CM

## 2022-07-04 DIAGNOSIS — D229 Melanocytic nevi, unspecified: Secondary | ICD-10-CM

## 2022-07-04 DIAGNOSIS — L821 Other seborrheic keratosis: Secondary | ICD-10-CM

## 2022-07-04 MED ORDER — KETOCONAZOLE 2 % EX CREA: 1.0000 | TOPICAL_CREAM | Freq: Every day | CUTANEOUS | 4 refills | Status: DC

## 2022-07-04 MED ORDER — HYDROCORTISONE 2.5 % EX CREA: TOPICAL_CREAM | CUTANEOUS | 1 refills | Status: DC

## 2022-07-04 MED ORDER — DOXYCYCLINE HYCLATE 20 MG PO TABS: ORAL_TABLET | ORAL | 5 refills | Status: DC

## 2022-07-04 NOTE — Progress Notes (Signed)
Follow-Up Visit   Subjective  Vanessa Walton is a 72 y.o. female who presents for the following: Skin Cancer Screening and Full Body Skin Exam Hx of bcc, hx of dysplastic, hx of rosacea, hx of seb derm at eyebrows.   Patient is bothered by skin tags at neck.  Gets caught on clothing.  The patient presents for Total-Body Skin Exam (TBSE) for skin cancer screening and mole check. The patient has spots, moles and lesions to be evaluated, some may be new or changing and the patient has concerns that these could be cancer.    The following portions of the chart were reviewed this encounter and updated as appropriate: medications, allergies, medical history  Review of Systems:  No other skin or systemic complaints except as noted in HPI or Assessment and Plan.  Objective  Well appearing patient in no apparent distress; mood and affect are within normal limits.  A full examination was performed including scalp, head, eyes, ears, nose, lips, neck, chest, axillae, abdomen, back, buttocks, bilateral upper extremities, bilateral lower extremities, hands, feet, fingers, toes, fingernails, and toenails. All findings within normal limits unless otherwise noted below.   Relevant physical exam findings are noted in the Assessment and Plan.    Assessment & Plan   ROSACEA Exam Clear at exam  Chronic condition with duration or expected duration over one year. Currently well-controlled.   Rosacea is a chronic progressive skin condition usually affecting the face of adults, causing redness and/or acne bumps. It is treatable but not curable. It sometimes affects the eyes (ocular rosacea) as well. It may respond to topical and/or systemic medication and can flare with stress, sun exposure, alcohol, exercise, topical steroids (including hydrocortisone/cortisone 10) and some foods.  Daily application of broad spectrum spf 30+ sunscreen to face is recommended to reduce flares.  Denies eye symptoms  today  Treatment Plan Continue Skin Medicinals metronidazole/ivermectin/azelaic acid 1-2x daily as needed to affected areas on the face. The patient was advised this is not covered by insurance since it is made by a compounding pharmacy. They will receive an email to check out and the medication will be mailed to their home.     Can continue to use doxycycline 20 mg tab, take 2 pills daily with food and drink for flares   Doxycycline should be taken with food to prevent nausea. Do not lay down for 30 minutes after taking. Be cautious with sun exposure and use good sun protection while on this medication. Pregnant women should not take this medication.   Milia Face White papules    Benign, observe.     Can continue Effaclar gel at bedtime to face    Seborrheic dermatitis Eyebrows  Exam:  Mild erythema/scale.   Chronic and persistent condition with duration or expected duration over one year. Condition is symptomatic/ bothersome to patient. Not currently at goal.   Seborrheic Dermatitis  -  is a chronic persistent rash characterized by pinkness and scaling most commonly of the mid face but also can occur on the scalp (dandruff), ears; mid chest, mid back and groin.  It tends to be exacerbated by stress and cooler weather.  People who have neurologic disease may experience new onset or exacerbation of existing seborrheic dermatitis.  The condition is not curable but treatable and can be controlled.   D/c clobetasol cream Cont HC 2.5% cr qd up to 5d/wk aa eyebrows prn flares Cont Ketoconazole 2% cr qd     LENTIGINES, SEBORRHEIC KERATOSES, HEMANGIOMAS -  Benign normal skin lesions - Benign-appearing - Call for any changes  MELANOCYTIC NEVI - Tan-brown and/or pink-flesh-colored symmetric macules and papules - Benign appearing on exam today, Photos of back/abdomen compared, no changes - Observation - Call clinic for new or changing moles - Recommend daily use of broad spectrum spf  30+ sunscreen to sun-exposed areas.   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  Benign findings on dermoscopy. Stable compared to previous visit.   Inferior umbilicus Exam: 3 mm med dark brown macule   Treatment Plan:  Benign-appearing. Observation.  Call clinic for new or changing moles.  Recommend daily use of broad spectrum spf 30+ sunscreen to sun-exposed areas.    ACTINIC DAMAGE - Chronic condition, secondary to cumulative UV/sun exposure - diffuse scaly erythematous macules with underlying dyspigmentation - Recommend daily broad spectrum sunscreen SPF 30+ to sun-exposed areas, reapply every 2 hours as needed.  - Staying in the shade or wearing long sleeves, sun glasses (UVA+UVB protection) and wide brim hats (4-inch brim around the entire circumference of the hat) are also recommended for sun protection.  - Call for new or changing lesions.  HISTORY OF BASAL CELL CARCINOMA OF THE SKIN Left nasal dorsum , Mohs 04/15/20 - No evidence of recurrence today - Recommend regular full body skin exams - Recommend daily broad spectrum sunscreen SPF 30+ to sun-exposed areas, reapply every 2 hours as needed.  - Call if any new or changing lesions are noted between office visits  History of Dysplastic Nevi. 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  Left Lower Back, severe atypia, EXC 03/21/2021 - No evidence of recurrence today - Recommend regular full body skin exams - Recommend daily broad spectrum sunscreen SPF 30+ to sun-exposed areas, reapply every 2 hours as needed.  - Call if any new or changing lesions are noted between office visits     SKIN CANCER SCREENING PERFORMED TODAY.    Acrochordon (15) left neck x 6, right neck x 9  Symptomatic, irritating, patient would like treated.   Discussed snip excision vs Ln2, will treat with cryotherapy today  Destruction of lesion - left neck x 6, right neck x  9  Destruction method: cryotherapy   Informed consent: discussed and consent obtained   Lesion destroyed using liquid nitrogen: Yes   Region frozen until ice ball extended beyond lesion: Yes   Outcome: patient tolerated procedure well with no complications   Post-procedure details: wound care instructions given   Additional details:  Prior to procedure, discussed risks of blister formation, small wound, skin dyspigmentation, or rare scar following cryotherapy. Recommend Vaseline ointment to treated areas while healing.   Rosacea  Related Medications doxycycline (PERIOSTAT) 20 MG tablet Take 2 by mouth with food daily and drink as needed for flares with rosacea  Seborrheic dermatitis  Related Medications hydrocortisone 2.5 % cream Apply to eyebrows 1-2 times a day as directed.  ketoconazole (NIZORAL) 2 % cream Apply 1 Application topically at bedtime. qhs to scaly areas eyebrows and face   Return in about 6 months (around 01/03/2023) for recheck moles seb derm and rosacea follow up.  I, Asher Muir, CMA, am acting as scribe for Willeen Niece, MD.   Documentation: I have reviewed the above documentation for accuracy and completeness, and I agree with the above.  Willeen Niece, MD

## 2022-07-04 NOTE — Patient Instructions (Addendum)
Recommend Elta MD Clear or Sheer (water resistant) for a good moisturizing spf that won't clog pores or cause breakouts   D/c clobetasol cream do not use at face or bold folds  Continue ketoconazole cream and hydrocortisone cream as needed for itchy scaly spots at eyebrows  Continue doxycline 20 mg tab take twice daily with food and drink as needed for rosacea flares  Doxycycline should be taken with food to prevent nausea. Do not lay down for 30 minutes after taking. Be cautious with sun exposure and use good sun protection while on this medication. Pregnant women should not take this medication.     Seborrheic Keratosis  What causes seborrheic keratoses? Seborrheic keratoses are harmless, common skin growths that first appear during adult life.  As time goes by, more growths appear.  Some people may develop a large number of them.  Seborrheic keratoses appear on both covered and uncovered body parts.  They are not caused by sunlight.  The tendency to develop seborrheic keratoses can be inherited.  They vary in color from skin-colored to gray, brown, or even black.  They can be either smooth or have a rough, warty surface.   Seborrheic keratoses are superficial and look as if they were stuck on the skin.  Under the microscope this type of keratosis looks like layers upon layers of skin.  That is why at times the top layer may seem to fall off, but the rest of the growth remains and re-grows.    Treatment Seborrheic keratoses do not need to be treated, but can easily be removed in the office.  Seborrheic keratoses often cause symptoms when they rub on clothing or jewelry.  Lesions can be in the way of shaving.  If they become inflamed, they can cause itching, soreness, or burning.  Removal of a seborrheic keratosis can be accomplished by freezing, burning, or surgery. If any spot bleeds, scabs, or grows rapidly, please return to have it checked, as these can be an indication of a skin  cancer.     Melanoma ABCDEs  Melanoma is the most dangerous type of skin cancer, and is the leading cause of death from skin disease.  You are more likely to develop melanoma if you: Have light-colored skin, light-colored eyes, or red or blond hair Spend a lot of time in the sun Tan regularly, either outdoors or in a tanning bed Have had blistering sunburns, especially during childhood Have a close family member who has had a melanoma Have atypical moles or large birthmarks  Early detection of melanoma is key since treatment is typically straightforward and cure rates are extremely high if we catch it early.   The first sign of melanoma is often a change in a mole or a new dark spot.  The ABCDE system is a way of remembering the signs of melanoma.  A for asymmetry:  The two halves do not match. B for border:  The edges of the growth are irregular. C for color:  A mixture of colors are present instead of an even brown color. D for diameter:  Melanomas are usually (but not always) greater than 6mm - the size of a pencil eraser. E for evolution:  The spot keeps changing in size, shape, and color.  Please check your skin once per month between visits. You can use a small mirror in front and a large mirror behind you to keep an eye on the back side or your body.   If you see  any new or changing lesions before your next follow-up, please call to schedule a visit.  Please continue daily skin protection including broad spectrum sunscreen SPF 30+ to sun-exposed areas, reapplying every 2 hours as needed when you're outdoors.   Staying in the shade or wearing long sleeves, sun glasses (UVA+UVB protection) and wide brim hats (4-inch brim around the entire circumference of the hat) are also recommended for sun protection.      Due to recent changes in healthcare laws, you may see results of your pathology and/or laboratory studies on MyChart before the doctors have had a chance to review them.  We understand that in some cases there may be results that are confusing or concerning to you. Please understand that not all results are received at the same time and often the doctors may need to interpret multiple results in order to provide you with the best plan of care or course of treatment. Therefore, we ask that you please give [place] 2 business days to thoroughly review all your results before contacting the office for clarification. Should we see a critical lab result, you will be contacted sooner.   If You Need Anything After Your Visit  If you have any questions or concerns for your doctor, please call our main line at [phone] and press option 4 to reach your doctor's medical assistant. If no one answers, please leave a voicemail as directed and we will return your call as soon as possible. Messages left after 4 pm will be answered the following business day.   You may also send [place] a message via MyChart. We typically respond to MyChart messages within 1-2 business days.  For prescription refills, please ask your pharmacy to contact our office. Our fax number is [phone].  If you have an urgent issue when the clinic is closed that cannot wait until the next business day, you can page your doctor at the number below.    Please note that while we do our best to be available for urgent issues outside of office hours, we are not available 24/7.   If you have an urgent issue and are unable to reach [place], you may choose to seek medical care at your doctor's office, retail clinic, urgent care center, or emergency room.  If you have a medical emergency, please immediately call 911 or go to the emergency department.  Pager Numbers  - Dr. Gwen Pounds: [phone]  - Dr. Neale Burly: [phone]  - Dr. Roseanne Reno: [phone]  In the event of inclement weather, please call our main line at [phone] for an update on the status of any delays or closures.  Dermatology Medication Tips: Please  keep the boxes that topical medications come in in order to help keep track of the instructions about where and how to use these. Pharmacies typically print the medication instructions only on the boxes and not directly on the medication tubes.   If your medication is too expensive, please contact our office at [phone] option 4 or send [place] a message through MyChart.   We are unable to tell what your co-pay for medications will be in advance as this is different depending on your insurance coverage. However, we may be able to find a substitute medication at lower cost or fill out paperwork to get insurance to cover a needed medication.   If a prior authorization is required to get your medication covered by your insurance company, please allow [place] 1-2 business days to complete this process.  Drug  prices often vary depending on where the prescription is filled and some pharmacies may offer cheaper prices.  The website www.goodrx.com contains coupons for medications through different pharmacies. The prices here do not account for what the cost may be with help from insurance (it may be cheaper with your insurance), but the website can give you the price if you did not use any insurance.  - You can print the associated coupon and take it with your prescription to the pharmacy.  - You may also stop by our office during regular business hours and pick up a GoodRx coupon card.  - If you need your prescription sent electronically to a different pharmacy, notify our office through [facility] or by phone at [phone] option 4.     Si Usted Necesita Algo Despus de Su Visita  Tambin puede enviarnos un mensaje a travs de Clinical cytogeneticist. Por lo general respondemos a los mensajes de MyChart en el transcurso de 1 a 2 das hbiles.  Para renovar recetas, por favor pida a su farmacia que se ponga en contacto con nuestra oficina. Annie Sable de fax es [place] [phone].  Si tiene un asunto urgente  cuando la clnica est cerrada y que no puede esperar hasta el siguiente da hbil, puede llamar/localizar a su doctor(a) al nmero que aparece a continuacin.   Por favor, tenga en cuenta que aunque hacemos todo lo posible para estar disponibles para asuntos urgentes fuera del horario de [place], no estamos disponibles las 24 horas del da, los 7 [address]  [address] de la [place].   Si tiene un problema urgente y no puede comunicarse con nosotros, puede optar por buscar atencin mdica  en el consultorio de su doctor(a), en una clnica privada, en un centro de atencin urgente o en una sala de emergencias.  Si tiene Engineer, drilling, por favor llame inmediatamente al 911 o vaya a la sala de emergencias.  Nmeros de bper  - Dr. Gwen Pounds: [phone]  - Dra. Moye: [phone]  - Dra. Roseanne Reno: [phone]  En caso de inclemencias del [place], por favor llame a Lacy Duverney principal al [phone] para una actualizacin sobre el [place] de cualquier retraso o cierre.  Consejos para la medicacin en dermatologa: Por favor, guarde las cajas en las que vienen los medicamentos de uso tpico para ayudarle a seguir las instrucciones sobre dnde y cmo usarlos. Las farmacias generalmente imprimen las instrucciones del medicamento slo en las cajas y no directamente en los tubos del [place].   Si su medicamento es muy caro, por favor, pngase en contacto con Rolm Gala llamando al [phone] y presione la opcin 4 o envenos un mensaje a travs de Clinical cytogeneticist.   No podemos decirle cul ser su copago por los medicamentos por adelantado ya que esto es diferente dependiendo de la cobertura de su seguro. Sin embargo, es posible que podamos encontrar un medicamento sustituto a Audiological scientist un formulario para que el seguro cubra el medicamento que se considera necesario.   Si se requiere una autorizacin previa para que su compaa de seguros [place] su medicamento, por favor permtanos de 1 a 2  das hbiles para completar [address]  Los precios de los medicamentos varan con frecuencia dependiendo del Environmental consultant de dnde se surte la receta y alguna farmacias pueden ofrecer precios ms baratos.  El sitio web www.goodrx.com tiene cupones para medicamentos de Health and safety inspector. Los precios aqu no tienen en cuenta lo que podra costar con la ayuda del seguro (puede ser ms barato  con su seguro), pero el sitio web puede darle el precio si no Field seismologist.  - Puede imprimir el cupn correspondiente y llevarlo con su receta a la farmacia.  - Tambin puede pasar por nuestra oficina durante el horario de atencin regular y Charity fundraiser una tarjeta de cupones de GoodRx.  - Si necesita que su receta se enve electrnicamente a una farmacia diferente, informe a nuestra oficina a travs de MyChart de [place] o por telfono llamando al [phone] y presione la opcin 4.

## 2022-07-08 ENCOUNTER — Other Ambulatory Visit: Payer: Self-pay | Admitting: Dermatology

## 2022-07-08 ENCOUNTER — Encounter: Payer: Self-pay | Admitting: Family

## 2022-07-08 DIAGNOSIS — A6000 Herpesviral infection of urogenital system, unspecified: Secondary | ICD-10-CM

## 2022-07-08 DIAGNOSIS — L719 Rosacea, unspecified: Secondary | ICD-10-CM

## 2022-07-10 MED ORDER — VALACYCLOVIR HCL 1 G PO TABS: ORAL_TABLET | ORAL | 0 refills | Status: DC

## 2022-07-19 DIAGNOSIS — M9905 Segmental and somatic dysfunction of pelvic region: Secondary | ICD-10-CM | POA: Diagnosis not present

## 2022-07-19 DIAGNOSIS — M9903 Segmental and somatic dysfunction of lumbar region: Secondary | ICD-10-CM | POA: Diagnosis not present

## 2022-07-19 DIAGNOSIS — M9901 Segmental and somatic dysfunction of cervical region: Secondary | ICD-10-CM | POA: Diagnosis not present

## 2022-07-19 DIAGNOSIS — M9902 Segmental and somatic dysfunction of thoracic region: Secondary | ICD-10-CM | POA: Diagnosis not present

## 2022-08-14 ENCOUNTER — Ambulatory Visit
Admission: RE | Admit: 2022-08-14 | Discharge: 2022-08-14 | Disposition: A | Discharge status: Home / Self Care | Payer: Medicare PPO | Source: Ambulatory Visit | Attending: Family | Admitting: Family

## 2022-08-14 DIAGNOSIS — Z1231 Encounter for screening mammogram for malignant neoplasm of breast: Secondary | ICD-10-CM | POA: Diagnosis not present

## 2022-08-16 ENCOUNTER — Other Ambulatory Visit: Payer: Self-pay | Admitting: Family

## 2022-08-16 DIAGNOSIS — M9902 Segmental and somatic dysfunction of thoracic region: Secondary | ICD-10-CM | POA: Diagnosis not present

## 2022-08-16 DIAGNOSIS — M9905 Segmental and somatic dysfunction of pelvic region: Secondary | ICD-10-CM | POA: Diagnosis not present

## 2022-08-16 DIAGNOSIS — M9903 Segmental and somatic dysfunction of lumbar region: Secondary | ICD-10-CM | POA: Diagnosis not present

## 2022-08-16 DIAGNOSIS — M9901 Segmental and somatic dysfunction of cervical region: Secondary | ICD-10-CM | POA: Diagnosis not present

## 2022-08-16 DIAGNOSIS — A6000 Herpesviral infection of urogenital system, unspecified: Secondary | ICD-10-CM

## 2022-08-26 ENCOUNTER — Other Ambulatory Visit: Payer: Self-pay | Admitting: Family

## 2022-08-26 DIAGNOSIS — A6000 Herpesviral infection of urogenital system, unspecified: Secondary | ICD-10-CM

## 2022-09-01 ENCOUNTER — Other Ambulatory Visit: Payer: Self-pay | Admitting: Family

## 2022-09-01 DIAGNOSIS — J209 Acute bronchitis, unspecified: Secondary | ICD-10-CM

## 2022-09-19 DIAGNOSIS — M9901 Segmental and somatic dysfunction of cervical region: Secondary | ICD-10-CM | POA: Diagnosis not present

## 2022-09-19 DIAGNOSIS — M9905 Segmental and somatic dysfunction of pelvic region: Secondary | ICD-10-CM | POA: Diagnosis not present

## 2022-09-19 DIAGNOSIS — M9903 Segmental and somatic dysfunction of lumbar region: Secondary | ICD-10-CM | POA: Diagnosis not present

## 2022-09-19 DIAGNOSIS — M9902 Segmental and somatic dysfunction of thoracic region: Secondary | ICD-10-CM | POA: Diagnosis not present

## 2022-10-11 ENCOUNTER — Other Ambulatory Visit: Payer: Self-pay | Admitting: Family

## 2022-10-17 DIAGNOSIS — M9905 Segmental and somatic dysfunction of pelvic region: Secondary | ICD-10-CM | POA: Diagnosis not present

## 2022-10-17 DIAGNOSIS — M9903 Segmental and somatic dysfunction of lumbar region: Secondary | ICD-10-CM | POA: Diagnosis not present

## 2022-10-17 DIAGNOSIS — M9902 Segmental and somatic dysfunction of thoracic region: Secondary | ICD-10-CM | POA: Diagnosis not present

## 2022-10-17 DIAGNOSIS — M9901 Segmental and somatic dysfunction of cervical region: Secondary | ICD-10-CM | POA: Diagnosis not present

## 2022-11-10 ENCOUNTER — Encounter: Payer: Self-pay | Admitting: Family

## 2022-11-13 DIAGNOSIS — M9905 Segmental and somatic dysfunction of pelvic region: Secondary | ICD-10-CM | POA: Diagnosis not present

## 2022-11-13 DIAGNOSIS — M9901 Segmental and somatic dysfunction of cervical region: Secondary | ICD-10-CM | POA: Diagnosis not present

## 2022-11-13 DIAGNOSIS — M9903 Segmental and somatic dysfunction of lumbar region: Secondary | ICD-10-CM | POA: Diagnosis not present

## 2022-11-13 DIAGNOSIS — M9902 Segmental and somatic dysfunction of thoracic region: Secondary | ICD-10-CM | POA: Diagnosis not present

## 2022-11-28 ENCOUNTER — Other Ambulatory Visit: Payer: Self-pay | Admitting: Family

## 2022-11-28 DIAGNOSIS — J302 Other seasonal allergic rhinitis: Secondary | ICD-10-CM

## 2022-12-19 ENCOUNTER — Telehealth: Payer: Self-pay

## 2022-12-19 NOTE — Telephone Encounter (Signed)
Patient called requesting a refill of [facility] Rosacea triple cream, ok rx [facility]

## 2022-12-22 DIAGNOSIS — M9901 Segmental and somatic dysfunction of cervical region: Secondary | ICD-10-CM | POA: Diagnosis not present

## 2022-12-22 DIAGNOSIS — M9902 Segmental and somatic dysfunction of thoracic region: Secondary | ICD-10-CM | POA: Diagnosis not present

## 2022-12-22 DIAGNOSIS — M9905 Segmental and somatic dysfunction of pelvic region: Secondary | ICD-10-CM | POA: Diagnosis not present

## 2022-12-22 DIAGNOSIS — M9903 Segmental and somatic dysfunction of lumbar region: Secondary | ICD-10-CM | POA: Diagnosis not present

## 2023-01-22 DIAGNOSIS — M9905 Segmental and somatic dysfunction of pelvic region: Secondary | ICD-10-CM | POA: Diagnosis not present

## 2023-01-22 DIAGNOSIS — M9902 Segmental and somatic dysfunction of thoracic region: Secondary | ICD-10-CM | POA: Diagnosis not present

## 2023-01-22 DIAGNOSIS — M9903 Segmental and somatic dysfunction of lumbar region: Secondary | ICD-10-CM | POA: Diagnosis not present

## 2023-01-22 DIAGNOSIS — M9901 Segmental and somatic dysfunction of cervical region: Secondary | ICD-10-CM | POA: Diagnosis not present

## 2023-01-30 ENCOUNTER — Ambulatory Visit: Payer: Medicare PPO | Admitting: Dermatology

## 2023-01-30 ENCOUNTER — Encounter: Payer: Self-pay | Admitting: Dermatology

## 2023-01-30 DIAGNOSIS — D229 Melanocytic nevi, unspecified: Secondary | ICD-10-CM

## 2023-01-30 DIAGNOSIS — L820 Inflamed seborrheic keratosis: Secondary | ICD-10-CM

## 2023-01-30 DIAGNOSIS — L719 Rosacea, unspecified: Secondary | ICD-10-CM

## 2023-01-30 DIAGNOSIS — L720 Epidermal cyst: Secondary | ICD-10-CM | POA: Diagnosis not present

## 2023-01-30 DIAGNOSIS — D225 Melanocytic nevi of trunk: Secondary | ICD-10-CM | POA: Diagnosis not present

## 2023-01-30 DIAGNOSIS — Z79899 Other long term (current) drug therapy: Secondary | ICD-10-CM

## 2023-01-30 DIAGNOSIS — D2239 Melanocytic nevi of other parts of face: Secondary | ICD-10-CM

## 2023-01-30 DIAGNOSIS — L219 Seborrheic dermatitis, unspecified: Secondary | ICD-10-CM

## 2023-01-30 NOTE — Progress Notes (Signed)
Follow-Up Visit   Subjective  Vanessa Walton is a 72 y.o. female who presents for the following: 7 month follow up. Rosacea. Has not take Doxycycline "in a while". Has been using [facility] metronidazole/ivermectin/azelaic acid compound as directed. States has been controlling well. Did have redness on chest so she applied the cream there and it worked well. Patient would like to know how can she tell if she needs to restart Doxycycline.   Seborrheic dermatitis at eyebrows. States she had been using the [facility] rosacea cream here and it has stayed under control.   Moles at left paranasal and inferior umbilicus. Here for recheck. Also check irritated area on back.   The patient has spots, moles and lesions to be evaluated, some may be new or changing and the patient may have concern these could be cancer.   The following portions of the chart were reviewed this encounter and updated as appropriate: medications, allergies, medical history  Review of Systems:  No other skin or systemic complaints except as noted in HPI or Assessment and Plan.  Objective  Well appearing patient in no apparent distress; mood and affect are within normal limits.  A focused examination was performed of the following areas: Face, back, abdomen  Relevant exam findings are noted in the Assessment and Plan.  Left Mid Back x1 Erythematous keratotic or waxy stuck-on papule or plaque.        Assessment & Plan   ROSACEA Exam Mild erythema at cheeks and nose  Chronic condition with duration or expected duration over one year. Currently well-controlled.   Rosacea is a chronic progressive skin condition usually affecting the face of adults, causing redness and/or acne bumps. It is treatable but not curable. It sometimes affects the eyes (ocular rosacea) as well. It may respond to topical and/or systemic medication and can flare with stress, sun exposure, alcohol, exercise, topical steroids  (including hydrocortisone/cortisone 10) and some foods.  Daily application of broad spectrum spf 30+ sunscreen to face is recommended to reduce flares.  Patient denies grittiness of the eyes  Treatment Plan Continue [facility] metronidazole/ivermectin/azelaic acid 1-2x daily as needed to affected areas on the face. The patient was advised this is not covered by insurance since it is made by a compounding pharmacy. They will receive an email to check out and the medication will be mailed to their home.     Can restart doxycycline 20 mg tab, take 2 pills daily with food and drink for flares. If eye symptoms worsen or rosacea flares.   Doxycycline should be taken with food to prevent nausea. Do not lay down for 30 minutes after taking. Be cautious with sun exposure and use good sun protection while on this medication. Pregnant women should not take this medication.    SEBORRHEIC DERMATITIS Exam: clear today at eyebrows  Chronic condition with duration or expected duration over one year. Currently well-controlled.    Seborrheic Dermatitis is a chronic persistent rash characterized by pinkness and scaling most commonly of the mid face but also can occur on the scalp (dandruff), ears; mid chest, mid back and groin.  It tends to be exacerbated by stress and cooler weather.  People who have neurologic disease may experience new onset or exacerbation of existing seborrheic dermatitis.  The condition is not curable but treatable and can be controlled.  Treatment Plan: Continue [facility] metronidazole/ivermectin/azelaic acid 1-2x daily as needed   Inflamed seborrheic keratosis Left Mid Back x1  Symptomatic, irritating, patient  would like treated.  Destruction of lesion - Left Mid Back x1  Destruction method: cryotherapy   Informed consent: discussed and consent obtained   Lesion destroyed using liquid nitrogen: Yes   Region frozen until ice ball extended beyond lesion: Yes    Outcome: patient tolerated procedure well with no complications   Post-procedure details: wound care instructions given   Additional details:  Prior to procedure, discussed risks of blister formation, small wound, skin dyspigmentation, or rare scar following cryotherapy. Recommend Vaseline ointment to treated areas while healing.    MELANOCYTIC NEVI Exam:   Left Paranasal  (Nevus Vs sebaceous hyperplasia)  Exam: 3.5 mm firm flesh smooth papule.  Benign findings on dermoscopy. Stable compared to previous visit.    Inferior umbilicus Exam: 3 mm med dark brown macule   Reviewed photos from 12/27/2020, nevi on abdomen are stable, new photos today  Treatment Plan: Benign-appearing. Stable compared to previous visit. Observation.  Call clinic for new or changing moles.  Recommend daily use of broad spectrum spf 30+ sunscreen to sun-exposed areas.    MILIA Exam: tiny erythematous firm white papule x1  Discussed this is a type of cyst. Benign-appearing. Sometimes these will clear with OTC adapalene/Differin 0.1% cream QHS or retinol.  Discussed extraction if symptomatic.  Treatment Plan: Symptomatic, irritating, patient would like treated.  Procedure risks and benefits were discussed with the patient including bruising and verbal consent was obtained. Following prep of the skin of the right mandible with an alcohol swab, area was injected with 1% lidocaine/epinephrine, extraction of milia was performed with cotton tip applicators following superficial incision made over their surfaces with a #11 surgical blade. Capillary hemostasis was achieved with 20% aluminum chloride solution. Vaseline ointment was applied to each site. The patient tolerated the procedure well.    Return in about 6 months (around 07/30/2023) for TBSE, HxBCC, HxDN.  I, Lawson Radar, CMA, am acting as scribe for Willeen Niece, MD.   Documentation: I have reviewed the above documentation for accuracy and completeness, and  I agree with the above.  Willeen Niece, MD

## 2023-01-30 NOTE — Patient Instructions (Addendum)
Cryotherapy Aftercare  Wash gently with soap and water everyday.   Apply Vaseline daily daily until healed.     Recommend daily broad spectrum sunscreen SPF 30+ to sun-exposed areas, reapply every 2 hours as needed. Call for new or changing lesions.  Staying in the shade or wearing long sleeves, sun glasses (UVA+UVB protection) and wide brim hats (4-inch brim around the entire circumference of the hat) are also recommended for sun protection.      Due to recent changes in healthcare laws, you may see results of your pathology and/or laboratory studies on MyChart before the doctors have had a chance to review them. We understand that in some cases there may be results that are confusing or concerning to you. Please understand that not all results are received at the same time and often the doctors may need to interpret multiple results in order to provide you with the best plan of care or course of treatment. Therefore, we ask that you please give [place] 2 business days to thoroughly review all your results before contacting the office for clarification. Should we see a critical lab result, you will be contacted sooner.   If You Need Anything After Your Visit  If you have any questions or concerns for your doctor, please call our main line at [phone] and press option 4 to reach your doctor's medical assistant. If no one answers, please leave a voicemail as directed and we will return your call as soon as possible. Messages left after 4 pm will be answered the following business day.   You may also send [place] a message via MyChart. We typically respond to MyChart messages within 1-2 business days.  For prescription refills, please ask your pharmacy to contact our office. Our fax number is [phone].  If you have an urgent issue when the clinic is closed that cannot wait until the next business day, you can page your doctor at the number below.    Please note that while we do our best to be  available for urgent issues outside of office hours, we are not available 24/7.   If you have an urgent issue and are unable to reach [place], you may choose to seek medical care at your doctor's office, retail clinic, urgent care center, or emergency room.  If you have a medical emergency, please immediately call 911 or go to the emergency department.  Pager Numbers  - Dr. Gwen Pounds: [phone]  - Dr. Roseanne Reno: [phone]  - Dr. Katrinka Blazing: [phone]   In the event of inclement weather, please call our main line at [phone] for an update on the status of any delays or closures.  Dermatology Medication Tips: Please keep the boxes that topical medications come in in order to help keep track of the instructions about where and how to use these. Pharmacies typically print the medication instructions only on the boxes and not directly on the medication tubes.   If your medication is too expensive, please contact our office at [phone] option 4 or send [place] a message through MyChart.   We are unable to tell what your co-pay for medications will be in advance as this is different depending on your insurance coverage. However, we may be able to find a substitute medication at lower cost or fill out paperwork to get insurance to cover a needed medication.   If a prior authorization is required to get your medication covered by your insurance company, please allow [place] 1-2 business days to complete  this process.  Drug prices often vary depending on where the prescription is filled and some pharmacies may offer cheaper prices.  The website www.goodrx.com contains coupons for medications through different pharmacies. The prices here do not account for what the cost may be with help from insurance (it may be cheaper with your insurance), but the website can give you the price if you did not use any insurance.  - You can print the associated coupon and take it with your prescription to the pharmacy.   - You may also stop by our office during regular business hours and pick up a GoodRx coupon card.  - If you need your prescription sent electronically to a different pharmacy, notify our office through [facility] or by phone at [phone] option 4.     Si Usted Necesita Algo Despus de Su Visita  Tambin puede enviarnos un mensaje a travs de Clinical cytogeneticist. Por lo general respondemos a los mensajes de MyChart en el transcurso de 1 a 2 das hbiles.  Para renovar recetas, por favor pida a su farmacia que se ponga en contacto con nuestra oficina. Annie Sable de fax es [place] [phone].  Si tiene un asunto urgente cuando la clnica est cerrada y que no puede esperar hasta el siguiente da hbil, puede llamar/localizar a su doctor(a) al nmero que aparece a continuacin.   Por favor, tenga en cuenta que aunque hacemos todo lo posible para estar disponibles para asuntos urgentes fuera del horario de [place], no estamos disponibles las 24 horas del da, los 7 [address]  [address] de la [place].   Si tiene un problema urgente y no puede comunicarse con nosotros, puede optar por buscar atencin mdica  en el consultorio de su doctor(a), en una clnica privada, en un centro de atencin urgente o en una sala de emergencias.  Si tiene Engineer, drilling, por favor llame inmediatamente al 911 o vaya a la sala de emergencias.  Nmeros de bper  - Dr. Gwen Pounds: [phone]  - Dra. Roseanne Reno: [phone]  - Dr. Katrinka Blazing: [phone]   En caso de inclemencias del tiempo, por favor llame a Lacy Duverney principal al [phone] para una actualizacin sobre el [place] de cualquier retraso o cierre.  Consejos para la medicacin en dermatologa: Por favor, guarde las cajas en las que vienen los medicamentos de uso tpico para ayudarle a seguir las instrucciones sobre dnde y cmo usarlos. Las farmacias generalmente imprimen las instrucciones del medicamento slo en las cajas y no directamente en los tubos del  [place].   Si su medicamento es muy caro, por favor, pngase en contacto con Rolm Gala llamando al [phone] y presione la opcin 4 o envenos un mensaje a travs de Clinical cytogeneticist.   No podemos decirle cul ser su copago por los medicamentos por adelantado ya que esto es diferente dependiendo de la cobertura de su seguro. Sin embargo, es posible que podamos encontrar un medicamento sustituto a Audiological scientist un formulario para que el seguro cubra el medicamento que se considera necesario.   Si se requiere una autorizacin previa para que su compaa de seguros [place] su medicamento, por favor permtanos de 1 a 2 das hbiles para completar [address]  Los precios de los medicamentos varan con frecuencia dependiendo del Environmental consultant de dnde se surte la receta y alguna farmacias pueden ofrecer precios ms baratos.  El sitio web www.goodrx.com tiene cupones para medicamentos de Health and safety inspector. Los precios aqu no tienen en cuenta lo que podra costar con la ayuda del  seguro (puede ser ms barato con su seguro), pero el sitio web puede darle el precio si no Visual merchandiser.  - Puede imprimir el cupn correspondiente y llevarlo con su receta a la farmacia.  - Tambin puede pasar por nuestra oficina durante el horario de atencin regular y Education officer, museum una tarjeta de cupones de GoodRx.  - Si necesita que su receta se enve electrnicamente a una farmacia diferente, informe a nuestra oficina a travs de MyChart de [place] o por telfono llamando al [phone] y presione la opcin 4.

## 2023-02-12 ENCOUNTER — Ambulatory Visit (INDEPENDENT_AMBULATORY_CARE_PROVIDER_SITE_OTHER): Payer: Medicare PPO

## 2023-02-12 VITALS — Ht 58.5 in | Wt 138.0 lb

## 2023-02-12 DIAGNOSIS — Z0000 Encounter for general adult medical examination without abnormal findings: Secondary | ICD-10-CM

## 2023-02-12 NOTE — Patient Instructions (Signed)
Ms. Mutschler , Thank you for taking time to come for your Medicare Wellness Visit. I appreciate your ongoing commitment to your health goals. Please review the following plan we discussed and let me know if I can assist you in the future.   Referrals/Orders/Follow-Ups/Clinician Recommendations: none  This is a list of the screening recommended for you and due dates:  Health Maintenance  Topic Date Due   COVID-19 Vaccine (6 - 2023-24 season) 11/05/2022   Medicare Annual Wellness Visit  02/12/2024   Mammogram  08/13/2024   Colon Cancer Screening  02/11/2027   DTaP/Tdap/Td vaccine (2 - Td or Tdap) 05/25/2027   Pneumonia Vaccine  Completed   Flu Shot  Completed   DEXA scan (bone density measurement)  Completed   Hepatitis C Screening  Completed   Zoster (Shingles) Vaccine  Completed   HPV Vaccine  Aged Out    Advanced directives: (Copy Requested) Please bring a copy of your health care power of attorney and living will to the office to be added to your chart at your convenience.  Next Medicare Annual Wellness Visit scheduled for next year: Yes 02/13/24 @ 9:30am televisit

## 2023-02-12 NOTE — Progress Notes (Signed)
Subjective:   Vanessa Walton is a 72 y.o. female who presents for Medicare Annual (Subsequent) preventive examination.  Visit Complete: Virtual I connected with  Savanna Handlin on 02/12/23 by a audio enabled telemedicine application and verified that I am speaking with the correct person using two identifiers.  Patient Location: Home  Provider Location: Office/Clinic  I discussed the limitations of evaluation and management by telemedicine. The patient expressed understanding and agreed to proceed.  Vital Signs: Because this visit was a virtual/telehealth visit, some criteria may be missing or patient reported. Any vitals not documented were not able to be obtained and vitals that have been documented are patient reported.  Patient Medicare AWV questionnaire was completed by the patient on 02/08/23; I have confirmed that all information answered by patient is correct and no changes since this date.  Cardiac Risk Factors include: advanced age (>82men, >63 women);dyslipidemia    Objective:    Today's Vitals   02/12/23 0925  Weight: 138 lb (62.6 kg)  Height: 4' 10.5" (1.486 m)   Body mass index is 28.35 kg/m.     02/12/2023    9:32 AM 02/10/2022    9:29 AM 02/07/2022    2:08 PM 12/05/2021   11:45 AM 10/29/2021    1:39 PM 08/22/2017    7:46 AM  Advanced Directives  Does Patient Have a Medical Advance Directive? Yes Yes Yes Yes Yes Yes  Type of Estate agent of [place];Living will  Healthcare Power of [place];Living will Healthcare Power of [place];Living will Healthcare Power of [place];Living will Living will  Does patient want to make changes to medical advance directive?      No - Patient declined  Copy of Healthcare Power of Attorney in Chart? No - copy requested  No - copy requested       Current Medications (verified) Outpatient Encounter Medications as of 02/12/2023  Medication Sig   B Complex Vitamins (B COMPLEX PO) Take by mouth daily.   Biotin  40981 MCG TABS Take by mouth.   cetirizine (ZYRTEC) 10 MG tablet TAKE 1 TABLET BY MOUTH EVERY DAY   Cholecalciferol (VITAMIN D3) 50 MCG (2000 UT) TABS Take by mouth.   fluticasone (FLOVENT HFA) 110 MCG/ACT inhaler Inhale 1 puff into the lungs in the morning and at bedtime.   GLUCOSAMINE CHONDROITIN COMPLX PO Take by mouth daily.   Omega-3 Fatty Acids (FISH OIL DOUBLE STRENGTH) 1200 MG CAPS Take by mouth 2 (two) times daily.   Probiotic Product (PROBIOTIC DAILY PO) Take by mouth.   rosuvastatin (CRESTOR) 10 MG tablet TAKE 1 TABLET BY MOUTH EVERYDAY AT BEDTIME   valACYclovir (VALTREX) 1000 MG tablet TAKE ONE BY MOUTH TWICE A DAY FOR FIVE DAYS AS NEEDED FOR OUTBREAK   vitamin E 400 UNIT capsule Take 400 Units by mouth daily.   XIIDRA 5 % SOLN    doxycycline (PERIOSTAT) 20 MG tablet Take 2 by mouth with food daily and drink as needed for flares with rosacea   hydrocortisone 2.5 % cream Apply to eyebrows 1-2 times a day as directed.   ketoconazole (NIZORAL) 2 % cream Apply 1 Application topically at bedtime. qhs to scaly areas eyebrows and face   No facility-administered encounter medications on file as of 02/12/2023.    Allergies (verified) Astelin [azelastine] and Flonase [fluticasone]   History: Past Medical History:  Diagnosis Date   Arthritis    right index finger   Basal cell carcinoma 02/24/2020   L nasal dorsum, [facility] 04/15/20  Dysplastic nevus 12/27/2020   Spinal mid upper back, Severe atypia, excised 03/14/2021   Dysplastic nevus 12/27/2020   Left Lower Back, severe atypia, EXC 03/21/2021   GERD (gastroesophageal reflux disease)    Herpes simplex virus (HSV) infection    Hyperlipidemia    Meniere disease 02/13/2019   Meniere's disease    Past Surgical History:  Procedure Laterality Date   CESAREAN SECTION     COLONOSCOPY WITH PROPOFOL N/A 08/22/2017   Procedure: COLONOSCOPY WITH PROPOFOL;  Surgeon: Toney Reil, MD;  [facility];  Service:  Endoscopy;  Laterality: N/A;   COLONOSCOPY WITH PROPOFOL N/A 02/10/2022   Procedure: COLONOSCOPY WITH PROPOFOL;  Surgeon: Toney Reil, MD;  Location: [facility];  Service: Gastroenterology;  Laterality: N/A;   POLYPECTOMY  08/22/2017   Procedure: POLYPECTOMY;  Surgeon: Toney Reil, MD;  Location: [facility];  Service: Endoscopy;;   TONSILLECTOMY     Family History  Problem Relation Age of Onset   Arthritis Mother    Hyperlipidemia Mother    Hyperlipidemia Father    Breast cancer Neg Hx    Social History   Socioeconomic History   Marital status: Married    Spouse name: Not on file   Number of children: 2   Years of education: Not on file   Highest education level: Not on file  Occupational History   Occupation: retired    Associate Professor: OTHER  Tobacco Use   Smoking status: Never   Smokeless tobacco: Never   Tobacco comments:    smoked socially as teenager  Vaping Use   Vaping status: Never Used  Substance and Sexual Activity   Alcohol use: Yes    Alcohol/week: 6.0 standard drinks of alcohol    Types: 6 Glasses of wine per week    Comment: moderate   Drug use: No   Sexual activity: Yes    Partners: Male    Birth control/protection: None  Other Topics Concern   Not on file  Social History Narrative   Age 77 daughter np school   Age 53 female   Social Determinants of Health   Financial Resource Strain: Low Risk  (02/08/2023)   Overall Financial Resource Strain (CARDIA)    Difficulty of Paying Living Expenses: Not hard at all  Food Insecurity: No Food Insecurity (02/08/2023)   Hunger Vital Sign    Worried About Running Out of Food in the Last Year: Never true    Ran Out of Food in the Last Year: Never true  Transportation Needs: No Transportation Needs (02/08/2023)   PRAPARE - Administrator, Civil Service (Medical): No    Lack of Transportation (Non-Medical): No  Physical Activity: Sufficiently Active (02/08/2023)   Exercise Vital  Sign    Days of Exercise per Week: 6 days    Minutes of Exercise per Session: 90 min  Stress: No Stress Concern Present (02/08/2023)   [employer] of Occupational Health - Occupational Stress Questionnaire    Feeling of Stress : Only a little  Social Connections: Unknown (02/08/2023)   Social Connection and Isolation Panel [NHANES]    Frequency of Communication with Friends and Family: More than three times a week    Frequency of Social Gatherings with Friends and Family: More than three times a week    Attends Religious Services: Not on file    Active Member of Clubs or Organizations: Yes    Attends Banker Meetings: More than 4 times per year  Marital Status: Married    Tobacco Counseling Counseling given: Not Answered Tobacco comments: smoked socially as teenager   Clinical Intake:  Pre-visit preparation completed: No  Pain : No/denies pain   BMI - recorded: 28.35 Nutritional Status: BMI 25 -29 Overweight Nutritional Risks: None Diabetes: No  How often do you need to have someone help you when you read instructions, pamphlets, or other written materials from your doctor or pharmacy?: 1 - Never  Interpreter Needed?: No  Comments: lives with husband Information entered by :: B.Damiya Sandefur,LPN   Activities of Daily Living    02/08/2023    5:25 PM  In your present state of health, do you have any difficulty performing the following activities:  Hearing? 0  Vision? 0  Difficulty concentrating or making decisions? 0  Walking or climbing stairs? 0  Dressing or bathing? 0  Doing errands, shopping? 0  Preparing Food and eating ? N  Using the Toilet? N  In the past six months, have you accidently leaked urine? N  Do you have problems with loss of bowel control? N  Managing your Medications? N  Managing your Finances? N  Housekeeping or managing your Housekeeping? N    Patient Care Team: Mort Sawyers, FNP as PCP - General (Family  Medicine) Dingeldein, Viviann Spare, MD (Ophthalmology)  Indicate any recent Medical Services you may have received from other than [facility] providers in the past year (date may be approximate).     Assessment:   This is a routine wellness examination for Janita.  Hearing/Vision screen Hearing Screening - Comments:: Pt says her hearing has slight loss (bilateral) per ENT Vision Screening - Comments:: Pt says her vision is good with glasses Dingeldein   Goals Addressed               This Visit's Progress     COMPLETED: Patient stated (pt-stated)   On track     I want to lose 10lbs       Depression Screen    02/12/2023    9:31 AM 02/07/2022    2:03 PM 05/30/2020   11:17 AM 02/13/2019    9:41 AM 05/24/2017   11:51 AM 04/11/2016    3:25 PM 08/01/2013    2:16 PM  PHQ 2/9 Scores  PHQ - 2 Score 0 0 0 0 0 0 0    Fall Risk    02/08/2023    5:25 PM 02/07/2022    2:07 PM 02/05/2022    2:32 PM 10/21/2021    9:43 AM 02/13/2019    9:41 AM  Fall Risk   Falls in the past year? 0 0 0 0 0  Number falls in past yr:  0 0 0   Injury with Fall?  0 0 0   Risk for fall due to : No Fall Risks No Fall Risks  No Fall Risks   Follow up Education provided;Falls prevention discussed Falls prevention discussed       MEDICARE RISK AT HOME: Medicare Risk at Home Any stairs in or around the home?: Yes If so, are there any without handrails?: No Home free of loose throw rugs in walkways, pet beds, electrical cords, etc?: Yes Adequate lighting in your home to reduce risk of falls?: Yes Life alert?: No Use of a cane, walker or w/c?: No Grab bars in the bathroom?: Yes Shower chair or bench in shower?: Yes Elevated toilet seat or a handicapped toilet?: No  TIMED UP AND GO:  Was the test performed?  No  Cognitive Function:        02/12/2023    9:34 AM 02/07/2022    2:08 PM  6CIT Screen  What Year? 0 points 0 points  What month? 0 points 0 points  What time? 0 points 0 points  Count back from 20  0 points 0 points  Months in reverse 0 points 0 points  Repeat phrase 0 points 0 points  Total Score 0 points 0 points    Immunizations Immunization History  Administered Date(s) Administered   Fluad Quad(high Dose 65+) 10/31/2018   Influenza, High Dose Seasonal PF 03/10/2016, 02/11/2018, 11/10/2022   Influenza,inj,Quad PF,6+ Mos 11/10/2014, 12/07/2016   Influenza-Unspecified 01/12/2020, 11/08/2021   PFIZER Comirnaty(Gray Top)Covid-19 Tri-Sucrose Vaccine 06/04/2020   PFIZER(Purple Top)SARS-COV-2 Vaccination 04/04/2019, 04/27/2019, 11/20/2019   Pfizer Covid-19 Vaccine Bivalent Booster 71yrs & up 10/22/2021   Pneumococcal Conjugate-13 04/11/2016   Pneumococcal Polysaccharide-23 05/24/2017   Tdap 05/24/2017   Zoster Recombinant(Shingrix) 07/08/2021, 10/05/2021   Zoster, Live 04/08/2015    TDAP status: Up to date  Flu Vaccine status: Up to date  Pneumococcal vaccine status: Up to date  Covid-19 vaccine status: Completed vaccines  Qualifies for Shingles Vaccine? Yes   Zostavax completed Yes   Shingrix Completed?: Yes  Screening Tests Health Maintenance  Topic Date Due   COVID-19 Vaccine (6 - 2023-24 season) 02/28/2023 (Originally 11/05/2022)   Medicare Annual Wellness (AWV)  02/12/2024   MAMMOGRAM  08/13/2024   Colonoscopy  02/11/2027   DTaP/Tdap/Td (2 - Td or Tdap) 05/25/2027   Pneumonia Vaccine 22+ Years old  Completed   INFLUENZA VACCINE  Completed   DEXA SCAN  Completed   Hepatitis C Screening  Completed   Zoster Vaccines- Shingrix  Completed   HPV VACCINES  Aged Out    Health Maintenance  There are no preventive care reminders to display for this patient.   Colorectal cancer screening: Type of screening: Colonoscopy. Completed 02/10/2022. Repeat every 5-10 years  Mammogram status: Completed 08/14/22. Repeat every year  Bone Density status: Completed 12/07/2021. Results reflect: Bone density results: NORMAL. Repeat every 5 years.  Lung Cancer Screening: (Low  Dose CT Chest recommended if Age 37-80 years, 20 pack-year currently smoking OR have quit w/in 15years.) does not qualify.   Lung Cancer Screening Referral: no  Additional Screening:  Hepatitis C Screening: does not qualify; Completed 10/21/21  Vision Screening: Recommended annual ophthalmology exams for early detection of glaucoma and other disorders of the eye. Is the patient up to date with their annual eye exam?  Yes  Who is the provider or what is the name of the office in which the patient attends annual eye exams? Dr Dellie Burns If pt is not established with a provider, would they like to be referred to a provider to establish care? No .   Dental Screening: Recommended annual dental exams for proper oral hygiene  Diabetic Foot Exam: n/a  Community Resource Referral / Chronic Care Management: CRR required this visit?  No   CCM required this visit?  No    Plan:     I have personally reviewed and noted the following in the patient's chart:   Medical and social history Use of alcohol, tobacco or illicit drugs  Current medications and supplements including opioid prescriptions. Patient is not currently taking opioid prescriptions. Functional ability and status Nutritional status Physical activity Advanced directives List of other physicians Hospitalizations, surgeries, and ER visits in previous 12 months Vitals Screenings to include cognitive, depression, and falls Referrals and appointments  In addition, I have reviewed and discussed with patient certain preventive protocols, quality metrics, and best practice recommendations. A written personalized care plan for preventive services as well as general preventive health recommendations were provided to patient.    Sue Lush, LPN   [id number]/11/[id number]   After Visit Summary: (MyChart) Due to this being a telephonic visit, the after visit summary with patients personalized plan was offered to patient via MyChart   Nurse  Notes: The patient states she is doing well and has no concerns or questions at this time.

## 2023-02-20 DIAGNOSIS — M9901 Segmental and somatic dysfunction of cervical region: Secondary | ICD-10-CM | POA: Diagnosis not present

## 2023-02-20 DIAGNOSIS — M9905 Segmental and somatic dysfunction of pelvic region: Secondary | ICD-10-CM | POA: Diagnosis not present

## 2023-02-20 DIAGNOSIS — M9902 Segmental and somatic dysfunction of thoracic region: Secondary | ICD-10-CM | POA: Diagnosis not present

## 2023-02-20 DIAGNOSIS — M9903 Segmental and somatic dysfunction of lumbar region: Secondary | ICD-10-CM | POA: Diagnosis not present

## 2023-02-24 ENCOUNTER — Other Ambulatory Visit: Payer: Self-pay | Admitting: Family

## 2023-02-24 DIAGNOSIS — J209 Acute bronchitis, unspecified: Secondary | ICD-10-CM

## 2023-03-12 DIAGNOSIS — M9901 Segmental and somatic dysfunction of cervical region: Secondary | ICD-10-CM | POA: Diagnosis not present

## 2023-03-12 DIAGNOSIS — M9903 Segmental and somatic dysfunction of lumbar region: Secondary | ICD-10-CM | POA: Diagnosis not present

## 2023-03-12 DIAGNOSIS — M9905 Segmental and somatic dysfunction of pelvic region: Secondary | ICD-10-CM | POA: Diagnosis not present

## 2023-03-12 DIAGNOSIS — M9902 Segmental and somatic dysfunction of thoracic region: Secondary | ICD-10-CM | POA: Diagnosis not present

## 2023-03-19 DIAGNOSIS — H16223 Keratoconjunctivitis sicca, not specified as Sjogren's, bilateral: Secondary | ICD-10-CM | POA: Diagnosis not present

## 2023-03-19 DIAGNOSIS — H2513 Age-related nuclear cataract, bilateral: Secondary | ICD-10-CM | POA: Diagnosis not present

## 2023-04-09 DIAGNOSIS — M9902 Segmental and somatic dysfunction of thoracic region: Secondary | ICD-10-CM | POA: Diagnosis not present

## 2023-04-09 DIAGNOSIS — M9901 Segmental and somatic dysfunction of cervical region: Secondary | ICD-10-CM | POA: Diagnosis not present

## 2023-04-09 DIAGNOSIS — M9903 Segmental and somatic dysfunction of lumbar region: Secondary | ICD-10-CM | POA: Diagnosis not present

## 2023-04-09 DIAGNOSIS — M9905 Segmental and somatic dysfunction of pelvic region: Secondary | ICD-10-CM | POA: Diagnosis not present

## 2023-04-15 ENCOUNTER — Other Ambulatory Visit: Payer: Self-pay | Admitting: Family

## 2023-04-24 ENCOUNTER — Telehealth: Payer: Medicare PPO | Admitting: Physician Assistant

## 2023-04-24 DIAGNOSIS — J019 Acute sinusitis, unspecified: Secondary | ICD-10-CM | POA: Diagnosis not present

## 2023-04-24 DIAGNOSIS — B9689 Other specified bacterial agents as the cause of diseases classified elsewhere: Secondary | ICD-10-CM | POA: Diagnosis not present

## 2023-04-24 DIAGNOSIS — J4531 Mild persistent asthma with (acute) exacerbation: Secondary | ICD-10-CM | POA: Diagnosis not present

## 2023-04-24 MED ORDER — DOXYCYCLINE HYCLATE 100 MG PO TABS: 100.0000 mg | ORAL_TABLET | Freq: Two times a day (BID) | ORAL | 0 refills | Status: DC

## 2023-04-24 MED ORDER — PREDNISONE 20 MG PO TABS
40.0000 mg | ORAL_TABLET | Freq: Every day | ORAL | 0 refills | Status: DC
Start: 2023-04-24 — End: 2023-04-30

## 2023-04-24 MED ORDER — BENZONATATE 100 MG PO CAPS
100.0000 mg | ORAL_CAPSULE | Freq: Three times a day (TID) | ORAL | 0 refills | Status: DC | PRN
Start: 2023-04-24 — End: 2023-12-03

## 2023-04-24 NOTE — Patient Instructions (Signed)
[name], thank you for joining Piedad Climes, PA-C for today's virtual visit.  While this provider is not your primary care provider (PCP), if your PCP is located in our provider database this encounter information will be shared with them immediately following your visit.   A [facility] MyChart account gives you access to today's visit and all your visits, tests, and labs performed at [facility] " click here if you don't have a [facility] MyChart account or go to [email or web address]  Consent: (Patient) Vanessa Walton provided verbal consent for this virtual visit at the beginning of the encounter.  Current Medications:  Current Outpatient Medications:    B Complex Vitamins (B COMPLEX PO), Take by mouth daily., Disp: , Rfl:    Biotin 45409 MCG TABS, Take by mouth., Disp: , Rfl:    cetirizine (ZYRTEC) 10 MG tablet, TAKE 1 TABLET BY MOUTH EVERY DAY, Disp: 90 tablet, Rfl: 3   Cholecalciferol (VITAMIN D3) 50 MCG (2000 UT) TABS, Take by mouth., Disp: , Rfl:    doxycycline (PERIOSTAT) 20 MG tablet, Take 2 by mouth with food daily and drink as needed for flares with rosacea, Disp: 60 tablet, Rfl: 5   fluticasone (FLOVENT HFA) 110 MCG/ACT inhaler, Inhale 1 puff into the lungs in the morning and at bedtime., Disp: 1 each, Rfl: 12   GLUCOSAMINE CHONDROITIN COMPLX PO, Take by mouth daily., Disp: , Rfl:    hydrocortisone 2.5 % cream, Apply to eyebrows 1-2 times a day as directed., Disp: 30 g, Rfl: 1   ketoconazole (NIZORAL) 2 % cream, Apply 1 Application topically at bedtime. qhs to scaly areas eyebrows and face, Disp: 60 g, Rfl: 4   Omega-3 Fatty Acids (FISH OIL DOUBLE STRENGTH) 1200 MG CAPS, Take by mouth 2 (two) times daily., Disp: , Rfl:    Probiotic Product (PROBIOTIC DAILY PO), Take by mouth., Disp: , Rfl:    rosuvastatin (CRESTOR) 10 MG tablet, TAKE 1 TABLET BY MOUTH EVERYDAY AT BEDTIME, Disp: 90 tablet, Rfl: 1   valACYclovir (VALTREX) 1000 MG tablet, TAKE ONE BY  MOUTH TWICE A DAY FOR FIVE DAYS AS NEEDED FOR OUTBREAK, Disp: 30 tablet, Rfl: 1   vitamin E 400 UNIT capsule, Take 400 Units by mouth daily., Disp: , Rfl:    XIIDRA 5 % SOLN, , Disp: , Rfl:    Medications ordered in this encounter:  No orders of the defined types were placed in this encounter.    *If you need refills on other medications prior to your next appointment, please contact your pharmacy*  Follow-Up: Call back or seek an in-person evaluation if the symptoms worsen or if the condition fails to improve as anticipated.  [facility] [phone]  Other Instructions Please take antibiotic as directed.  Increase fluid intake.  Use Saline nasal spray.  Take a daily multivitamin. Start the Prednisone and cough medication as directed. Ok to continue your OTC medications.  Place a humidifier in the bedroom.  Please call or return clinic if symptoms are not improving.  Sinusitis Sinusitis is redness, soreness, and swelling (inflammation) of the paranasal sinuses. Paranasal sinuses are air pockets within the bones of your face (beneath the eyes, the middle of the forehead, or above the eyes). In healthy paranasal sinuses, mucus is able to drain out, and air is able to circulate through them by way of your nose. However, when your paranasal sinuses are inflamed, mucus and air can become trapped. This can allow bacteria and other germs  to grow and cause infection. Sinusitis can develop quickly and last only a short time (acute) or continue over a long period (chronic). Sinusitis that lasts for more than 12 weeks is considered chronic.  CAUSES  Causes of sinusitis include: Allergies. Structural abnormalities, such as displacement of the cartilage that separates your nostrils (deviated septum), which can decrease the air flow through your nose and sinuses and affect sinus drainage. Functional abnormalities, such as when the small hairs (cilia) that line your sinuses and help remove  mucus do not work properly or are not present. SYMPTOMS  Symptoms of acute and chronic sinusitis are the same. The primary symptoms are pain and pressure around the affected sinuses. Other symptoms include: Upper toothache. Earache. Headache. Bad breath. Decreased sense of smell and taste. A cough, which worsens when you are lying flat. Fatigue. Fever. Thick drainage from your nose, which often is green and may contain pus (purulent). Swelling and warmth over the affected sinuses. DIAGNOSIS  Your caregiver will perform a physical exam. During the exam, your caregiver may: Look in your nose for signs of abnormal growths in your nostrils (nasal polyps). Tap over the affected sinus to check for signs of infection. View the inside of your sinuses (endoscopy) with a special imaging device with a light attached (endoscope), which is inserted into your sinuses. If your caregiver suspects that you have chronic sinusitis, one or more of the following tests may be recommended: Allergy tests. Nasal culture A sample of mucus is taken from your nose and sent to a lab and screened for bacteria. Nasal cytology A sample of mucus is taken from your nose and examined by your caregiver to determine if your sinusitis is related to an allergy. TREATMENT  Most cases of acute sinusitis are related to a viral infection and will resolve on their own within 10 days. Sometimes medicines are prescribed to help relieve symptoms (pain medicine, decongestants, nasal steroid sprays, or saline sprays).  However, for sinusitis related to a bacterial infection, your caregiver will prescribe antibiotic medicines. These are medicines that will help kill the bacteria causing the infection.  Rarely, sinusitis is caused by a fungal infection. In theses cases, your caregiver will prescribe antifungal medicine. For some cases of chronic sinusitis, surgery is needed. Generally, these are cases in which sinusitis recurs more than 3  times per year, despite other treatments. HOME CARE INSTRUCTIONS  Drink plenty of water. Water helps thin the mucus so your sinuses can drain more easily. Use a humidifier. Inhale steam 3 to 4 times a day (for example, sit in the bathroom with the shower running). Apply a warm, moist washcloth to your face 3 to 4 times a day, or as directed by your caregiver. Use saline nasal sprays to help moisten and clean your sinuses. Take over-the-counter or prescription medicines for pain, discomfort, or fever only as directed by your caregiver. SEEK IMMEDIATE MEDICAL CARE IF: You have increasing pain or severe headaches. You have nausea, vomiting, or drowsiness. You have swelling around your face. You have vision problems. You have a stiff neck. You have difficulty breathing. MAKE SURE YOU:  Understand these instructions. Will watch your condition. Will get help right away if you are not doing well or get worse. Document Released: 02/20/2005 Document Revised: 05/15/2011 Document Reviewed: 03/07/2011 [facility] Patient Information 2014 [place], [place].    If you have been instructed to have an in-person evaluation today at a local Urgent Care facility, please use the link below. It will take  you to a list of all of our available [facility], including address, phone number and hours of operation. Please do not delay care.  [facility]  If you or a family member do not have a primary care provider, use the link below to schedule a visit and establish care. When you choose a [facility] primary care physician or advanced practice provider, you gain a long-term partner in health. Find a Primary Care Provider  Learn more about [facility] in-office and virtual care options: [facility] - Get Care Now

## 2023-04-24 NOTE — Progress Notes (Signed)
Virtual Visit Consent   Vanessa Walton, you are scheduled for a virtual visit with a [facility] provider today. Just as with appointments in the office, your consent must be obtained to participate. Your consent will be active for this visit and any virtual visit you may have with one of our providers in the next 365 days. If you have a MyChart account, a copy of this consent can be sent to you electronically.  As this is a virtual visit, video technology does not allow for your provider to perform a traditional examination. This may limit your provider's ability to fully assess your condition. If your provider identifies any concerns that need to be evaluated in person or the need to arrange testing (such as labs, EKG, etc.), we will make arrangements to do so. Although advances in technology are sophisticated, we cannot ensure that it will always work on either your end or our end. If the connection with a video visit is poor, the visit may have to be switched to a telephone visit. With either a video or telephone visit, we are not always able to ensure that we have a secure connection.  By engaging in this virtual visit, you consent to the provision of healthcare and authorize for your insurance to be billed (if applicable) for the services provided during this visit. Depending on your insurance coverage, you may receive a charge related to this service.  I need to obtain your verbal consent now. Are you willing to proceed with your visit today? Vanessa Walton has provided verbal consent on 04/24/2023 for a virtual visit (video or telephone). Piedad Climes, [place]  Date: 04/24/2023 11:08 AM   Virtual Visit via Video Note   I, Piedad Climes, connected with  Vanessa Walton  ([id number], 1950-11-23) on 04/24/23 at 11:00 AM EST by a video-enabled telemedicine application and verified that I am speaking with the correct person using two identifiers.  Location: Patient: Virtual Visit Location  Patient: Home Provider: Virtual Visit Location Provider: Home Office   I discussed the limitations of evaluation and management by telemedicine and the availability of in person appointments. The patient expressed understanding and agreed to proceed.    History of Present Illness: Vanessa Walton is a 73 y.o. who identifies as a female who was assigned female at birth, and is being seen today for nasal congestion, sinus pressure and headache starting a week ago, now with increased chest congestion and cough that is productive of thick phlegm. Denies fever, chills. Took home COVID test which was negative. Has history of chronic ETD and asthma, using her medications as directed. Could not sleep last night due to substantial cough. R-sided ear pain that has been constant now.  HPI: HPI  Problems:  Patient Active Problem List   Diagnosis Date Noted   Polyp of descending colon 02/10/2022   History of colonic polyps 12/15/2021   Kidney lesion, native, left 12/13/2021   Cyst of right kidney 12/13/2021   Seasonal allergic rhinitis 12/09/2021   Bronchospasm 12/09/2021   Positive hepatitis C antibody test 10/21/2021   Postmenopausal 10/21/2021   Elevated liver function tests 10/21/2021   Chronically dry eyes, 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   Rosacea, unspecified 05/24/2021   Chronic Eustachian tube dysfunction, right 05/24/2021   Benign paroxysmal positional vertigo of right ear 05/30/2020   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   HLD (hyperlipidemia) 08/01/2013    Allergies:  Allergies  Allergen Reactions  Astelin [Azelastine] Other (See Comments)    Irritation to nose   Flonase [Fluticasone] Other (See Comments)    Due to chronic eustacian tube dysfunction   Medications:  Current Outpatient Medications:    benzonatate (TESSALON) 100 MG capsule, Take 1 capsule (100 mg total) by mouth 3 (three) times daily as needed for cough., Disp: 30  capsule, Rfl: 0   doxycycline (VIBRA-TABS) 100 MG tablet, Take 1 tablet (100 mg total) by mouth 2 (two) times daily., Disp: 20 tablet, Rfl: 0   predniSONE (DELTASONE) 20 MG tablet, Take 2 tablets (40 mg total) by mouth daily with breakfast., Disp: 10 tablet, Rfl: 0   B Complex Vitamins (B COMPLEX PO), Take by mouth daily., Disp: , Rfl:    Biotin 16109 MCG TABS, Take by mouth., Disp: , Rfl:    cetirizine (ZYRTEC) 10 MG tablet, TAKE 1 TABLET BY MOUTH EVERY DAY, Disp: 90 tablet, Rfl: 3   Cholecalciferol (VITAMIN D3) 50 MCG (2000 UT) TABS, Take by mouth., Disp: , Rfl:    fluticasone (FLOVENT HFA) 110 MCG/ACT inhaler, Inhale 1 puff into the lungs in the morning and at bedtime., Disp: 1 each, Rfl: 12   GLUCOSAMINE CHONDROITIN COMPLX PO, Take by mouth daily., Disp: , Rfl:    Omega-3 Fatty Acids (FISH OIL DOUBLE STRENGTH) 1200 MG CAPS, Take by mouth 2 (two) times daily., Disp: , Rfl:    Probiotic Product (PROBIOTIC DAILY PO), Take by mouth., Disp: , Rfl:    rosuvastatin (CRESTOR) 10 MG tablet, TAKE 1 TABLET BY MOUTH EVERYDAY AT BEDTIME, Disp: 90 tablet, Rfl: 1   valACYclovir (VALTREX) 1000 MG tablet, TAKE ONE BY MOUTH TWICE A DAY FOR FIVE DAYS AS NEEDED FOR OUTBREAK, Disp: 30 tablet, Rfl: 1   vitamin E 400 UNIT capsule, Take 400 Units by mouth daily., Disp: , Rfl:    XIIDRA 5 % SOLN, , Disp: , Rfl:   Observations/Objective: Patient is well-developed, well-nourished in no acute distress.  Resting comfortably at home.  Head is normocephalic, atraumatic.  No labored breathing. Speech is clear and coherent with logical content.  Patient is alert and oriented at baseline.   Assessment and Plan: 1. Acute bacterial sinusitis (Primary) - benzonatate (TESSALON) 100 MG capsule; Take 1 capsule (100 mg total) by mouth 3 (three) times daily as needed for cough.  Dispense: 30 capsule; Refill: 0 - doxycycline (VIBRA-TABS) 100 MG tablet; Take 1 tablet (100 mg total) by mouth 2 (two) times daily.  Dispense: 20  tablet; Refill: 0  2. Mild persistent asthma with exacerbation - predniSONE (DELTASONE) 20 MG tablet; Take 2 tablets (40 mg total) by mouth daily with breakfast.  Dispense: 10 tablet; Refill: 0  Rx Doxycycline.  Increase fluids.  Rest.  Saline nasal spray.  Probiotic.  Mucinex as directed.  Humidifier in bedroom. Tessalon and prednisone per orders.  Call or return to clinic if symptoms are not improving.   Follow Up Instructions: I discussed the assessment and treatment plan with the patient. The patient was provided an opportunity to ask questions and all were answered. The patient agreed with the plan and demonstrated an understanding of the instructions.  A copy of instructions were sent to the patient via MyChart unless otherwise noted below.   The patient was advised to call back or seek an in-person evaluation if the symptoms worsen or if the condition fails to improve as anticipated.    Piedad Climes, PA-C

## 2023-04-30 ENCOUNTER — Telehealth: Payer: Medicare PPO | Admitting: Physician Assistant

## 2023-04-30 DIAGNOSIS — J208 Acute bronchitis due to other specified organisms: Secondary | ICD-10-CM | POA: Diagnosis not present

## 2023-04-30 DIAGNOSIS — B9689 Other specified bacterial agents as the cause of diseases classified elsewhere: Secondary | ICD-10-CM

## 2023-04-30 MED ORDER — PREDNISONE 20 MG PO TABS: 40.0000 mg | ORAL_TABLET | Freq: Every day | ORAL | 0 refills | Status: DC

## 2023-04-30 MED ORDER — PROMETHAZINE-DM 6.25-15 MG/5ML PO SYRP: 5.0000 mL | ORAL_SOLUTION | Freq: Four times a day (QID) | ORAL | 0 refills | Status: DC | PRN

## 2023-04-30 MED ORDER — AZITHROMYCIN 250 MG PO TABS: ORAL_TABLET | ORAL | 0 refills | Status: AC

## 2023-04-30 NOTE — Patient Instructions (Signed)
 Vanessa Walton, thank you for joining Vanessa Loveless, PA-C for today's virtual visit.  While this provider is not your primary care provider (PCP), if your PCP is located in our provider database this encounter information will be shared with them immediately following your visit.   A [facility] MyChart account gives you access to today's visit and all your visits, tests, and labs performed at [facility] " click here if you don't have a [facility] MyChart account or go to [email or web address]  Consent: (Patient) Vanessa Walton provided verbal consent for this virtual visit at the beginning of the encounter.  Current Medications:  Current Outpatient Medications:    azithromycin (ZITHROMAX) 250 MG tablet, Take 2 tablets on day 1, then 1 tablet daily on days 2 through 5, Disp: 6 tablet, Rfl: 0   predniSONE (DELTASONE) 20 MG tablet, Take 2 tablets (40 mg total) by mouth daily with breakfast., Disp: 10 tablet, Rfl: 0   promethazine-dextromethorphan (PROMETHAZINE-DM) 6.25-15 MG/5ML syrup, Take 5 mLs by mouth 4 (four) times daily as needed., Disp: 118 mL, Rfl: 0   B Complex Vitamins (B COMPLEX PO), Take by mouth daily., Disp: , Rfl:    benzonatate (TESSALON) 100 MG capsule, Take 1 capsule (100 mg total) by mouth 3 (three) times daily as needed for cough., Disp: 30 capsule, Rfl: 0   Biotin 16109 MCG TABS, Take by mouth., Disp: , Rfl:    cetirizine (ZYRTEC) 10 MG tablet, TAKE 1 TABLET BY MOUTH EVERY DAY, Disp: 90 tablet, Rfl: 3   Cholecalciferol (VITAMIN D3) 50 MCG (2000 UT) TABS, Take by mouth., Disp: , Rfl:    doxycycline (VIBRA-TABS) 100 MG tablet, Take 1 tablet (100 mg total) by mouth 2 (two) times daily., Disp: 20 tablet, Rfl: 0   fluticasone (FLOVENT HFA) 110 MCG/ACT inhaler, Inhale 1 puff into the lungs in the morning and at bedtime., Disp: 1 each, Rfl: 12   GLUCOSAMINE CHONDROITIN COMPLX PO, Take by mouth daily., Disp: , Rfl:    Omega-3 Fatty Acids (FISH OIL DOUBLE  STRENGTH) 1200 MG CAPS, Take by mouth 2 (two) times daily., Disp: , Rfl:    Probiotic Product (PROBIOTIC DAILY PO), Take by mouth., Disp: , Rfl:    rosuvastatin (CRESTOR) 10 MG tablet, TAKE 1 TABLET BY MOUTH EVERYDAY AT BEDTIME, Disp: 90 tablet, Rfl: 1   valACYclovir (VALTREX) 1000 MG tablet, TAKE ONE BY MOUTH TWICE A DAY FOR FIVE DAYS AS NEEDED FOR OUTBREAK, Disp: 30 tablet, Rfl: 1   vitamin E 400 UNIT capsule, Take 400 Units by mouth daily., Disp: , Rfl:    XIIDRA 5 % SOLN, , Disp: , Rfl:    Medications ordered in this encounter:  Meds ordered this encounter  Medications   azithromycin (ZITHROMAX) 250 MG tablet    Sig: Take 2 tablets on day 1, then 1 tablet daily on days 2 through 5    Dispense:  6 tablet    Refill:  0    Supervising Provider:   Merrilee Jansky [[id number]]   predniSONE (DELTASONE) 20 MG tablet    Sig: Take 2 tablets (40 mg total) by mouth daily with breakfast.    Dispense:  10 tablet    Refill:  0    Supervising Provider:   Merrilee Jansky [[id number]]   promethazine-dextromethorphan (PROMETHAZINE-DM) 6.25-15 MG/5ML syrup    Sig: Take 5 mLs by mouth 4 (four) times daily as needed.    Dispense:  118 mL    Refill:  0  Supervising Provider:   Merrilee Jansky [[id number]]     *If you need refills on other medications prior to your next appointment, please contact your pharmacy*  Follow-Up: Call back or seek an in-person evaluation if the symptoms worsen or if the condition fails to improve as anticipated.  [facility] [phone]  Other Instructions Acute Bronchitis, Adult  Acute bronchitis is sudden inflammation of the main airways (bronchi) that come off the windpipe (trachea) in the lungs. The swelling causes the airways to get smaller and make more mucus than normal. This can make it hard to breathe and can cause coughing or noisy breathing (wheezing). Acute bronchitis may last several weeks. The cough may last longer. Allergies, asthma, and  exposure to smoke may make the condition worse. What are the causes? This condition can be caused by germs and by substances that irritate the lungs, including: Cold and flu viruses. The most common cause of this condition is the virus that causes the common cold. Bacteria. This is less common. Breathing in substances that irritate the lungs, including: Smoke from cigarettes and other forms of tobacco. Dust and pollen. Fumes from household cleaning products, gases, or burned fuel. Indoor or outdoor air pollution. What increases the risk? The following factors may make you more likely to develop this condition: A weak body's defense system, also called the immune system. A condition that affects your lungs and breathing, such as asthma. What are the signs or symptoms? Common symptoms of this condition include: Coughing. This may bring up clear, yellow, or green mucus from your lungs (sputum). Wheezing. Runny or stuffy nose. Having too much mucus in your lungs (chest congestion). Shortness of breath. Aches and pains, including sore throat or chest. How is this diagnosed? This condition is usually diagnosed based on: Your symptoms and medical history. A physical exam. You may also have other tests, including tests to rule out other conditions, such as pneumonia. These tests include: A test of lung function. Test of a mucus sample to look for the presence of bacteria. Tests to check the oxygen level in your blood. Blood tests. Chest X-ray. How is this treated? Most cases of acute bronchitis clear up over time without treatment. Your health care provider may recommend: Drinking more fluids to help thin your mucus so it is easier to cough up. Taking inhaled medicine (inhaler) to improve air flow in and out of your lungs. Using a vaporizer or a humidifier. These are machines that add water to the air to help you breathe better. Taking a medicine that thins mucus and clears congestion  (expectorant). Taking a medicine that prevents or stops coughing (cough suppressant). It is not common to take an antibiotic medicine for this condition. Follow these instructions at home:  Take over-the-counter and prescription medicines only as told by your health care provider. Use an inhaler, vaporizer, or humidifier as told by your health care provider. Take two teaspoons (10 mL) of honey at bedtime to lessen coughing at night. Drink enough fluid to keep your urine pale yellow. Do not use any products that contain nicotine or tobacco. These products include cigarettes, chewing tobacco, and vaping devices, such as e-cigarettes. If you need help quitting, ask your health care provider. Get plenty of rest. Return to your normal activities as told by your health care provider. Ask your health care provider what activities are safe for you. Keep all follow-up visits. This is important. How is this prevented? To lower your risk of  getting this condition again: Wash your hands often with soap and water for at least 20 seconds. If soap and water are not available, use hand sanitizer. Avoid contact with people who have cold symptoms. Try not to touch your mouth, nose, or eyes with your hands. Avoid breathing in smoke or chemical fumes. Breathing smoke or chemical fumes will make your condition worse. Get the flu shot every year. Contact a health care provider if: Your symptoms do not improve after 2 weeks. You have trouble coughing up the mucus. Your cough keeps you awake at night. You have a fever. Get help right away if you: Cough up blood. Feel pain in your chest. Have severe shortness of breath. Faint or keep feeling like you are going to faint. Have a severe headache. Have a fever or chills that get worse. These symptoms may represent a serious problem that is an emergency. Do not wait to see if the symptoms will go away. Get medical help right away. Call your local emergency  services (911 in the [place] Do not drive yourself to the hospital. Summary Acute bronchitis is inflammation of the main airways (bronchi) that come off the windpipe (trachea) in the lungs. The swelling causes the airways to get smaller and make more mucus than normal. Drinking more fluids can help thin your mucus so it is easier to cough up. Take over-the-counter and prescription medicines only as told by your health care provider. Do not use any products that contain nicotine or tobacco. These products include cigarettes, chewing tobacco, and vaping devices, such as e-cigarettes. If you need help quitting, ask your health care provider. Contact a health care provider if your symptoms do not improve after 2 weeks. This information is not intended to replace advice given to you by your health care provider. Make sure you discuss any questions you have with your health care provider. Document Revised: 06/02/2021 Document Reviewed: 06/23/2020 Elsevier Patient Education  2024 [employer]   If you have been instructed to have an in-person evaluation today at a local Urgent Care facility, please use the link below. It will take you to a list of all of our available [facility], including address, phone number and hours of operation. Please do not delay care.  [facility]  If you or a family member do not have a primary care provider, use the link below to schedule a visit and establish care. When you choose a [facility] primary care physician or advanced practice provider, you gain a long-term partner in health. Find a Primary Care Provider  Learn more about [facility]'s in-office and virtual care options: [facility] - Get Care Now

## 2023-04-30 NOTE — Progress Notes (Signed)
 Virtual Visit Consent   Vanessa Walton, you are scheduled for a virtual visit with a [facility] provider today. Just as with appointments in the office, your consent must be obtained to participate. Your consent will be active for this visit and any virtual visit you may have with one of our providers in the next 365 days. If you have a MyChart account, a copy of this consent can be sent to you electronically.  As this is a virtual visit, video technology does not allow for your provider to perform a traditional examination. This may limit your provider's ability to fully assess your condition. If your provider identifies any concerns that need to be evaluated in person or the need to arrange testing (such as labs, EKG, etc.), we will make arrangements to do so. Although advances in technology are sophisticated, we cannot ensure that it will always work on either your end or our end. If the connection with a video visit is poor, the visit may have to be switched to a telephone visit. With either a video or telephone visit, we are not always able to ensure that we have a secure connection.  By engaging in this virtual visit, you consent to the provision of healthcare and authorize for your insurance to be billed (if applicable) for the services provided during this visit. Depending on your insurance coverage, you may receive a charge related to this service.  I need to obtain your verbal consent now. Are you willing to proceed with your visit today? Vanessa Walton has provided verbal consent on 04/30/2023 for a virtual visit (video or telephone). Vanessa Loveless, PA-C  Date: 04/30/2023 4:47 PM   Virtual Visit via Video Note   I, Vanessa Walton, connected with  Vanessa Walton  ([id number], 1950-07-05) on 04/30/23 at  4:45 PM EST by a video-enabled telemedicine application and verified that I am speaking with the correct person using two identifiers.  Location: Patient: Virtual Visit Location  Patient: Home Provider: Virtual Visit Location Provider: Home Office   I discussed the limitations of evaluation and management by telemedicine and the availability of in person appointments. The patient expressed understanding and agreed to proceed.    History of Present Illness: Vanessa Walton is a 73 y.o. who identifies as a female who was assigned female at birth, and is being seen today for continued cough and congestion.  HPI: Sinusitis This is a new problem. The current episode started 1 to 4 weeks ago (2-3 weeks; Seen virtually on 04/24/23 and given Doxycycline and Prednisone 40mg  burst). The problem has been gradually improving (sinus symptoms improving; cough and chest congestion have not) since onset. There has been no fever. The pain is mild. Associated symptoms include congestion, coughing, headaches, sinus pressure and a sore throat. Pertinent negatives include no chills, diaphoresis, ear pain, hoarse voice or shortness of breath. (diarrhea) Past treatments include antibiotics, oral decongestants and acetaminophen (On doxycycline currently, finished 5 day Prednisone 40mg , zyrtec-D, delsym). The treatment provided no relief.     Problems:  Patient Active Problem List   Diagnosis Date Noted   Polyp of descending colon 02/10/2022   History of colonic polyps 12/15/2021   Kidney lesion, native, left 12/13/2021   Cyst of right kidney 12/13/2021   Seasonal allergic rhinitis 12/09/2021   Bronchospasm 12/09/2021   Positive hepatitis C antibody test 10/21/2021   Postmenopausal 10/21/2021   Elevated liver function tests 10/21/2021   Chronically dry eyes, 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   Rosacea, unspecified 05/24/2021  Chronic Eustachian tube dysfunction, right 05/24/2021   Benign paroxysmal positional vertigo of right ear 05/30/2020   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   HLD (hyperlipidemia) 08/01/2013    Allergies:  Allergies   Allergen Reactions   Astelin [Azelastine] Other (See Comments)    Irritation to nose   Flonase [Fluticasone] Other (See Comments)    Due to chronic eustacian tube dysfunction   Medications:  Current Outpatient Medications:    azithromycin (ZITHROMAX) 250 MG tablet, Take 2 tablets on day 1, then 1 tablet daily on days 2 through 5, Disp: 6 tablet, Rfl: 0   predniSONE (DELTASONE) 20 MG tablet, Take 2 tablets (40 mg total) by mouth daily with breakfast., Disp: 10 tablet, Rfl: 0   promethazine-dextromethorphan (PROMETHAZINE-DM) 6.25-15 MG/5ML syrup, Take 5 mLs by mouth 4 (four) times daily as needed., Disp: 118 mL, Rfl: 0   B Complex Vitamins (B COMPLEX PO), Take by mouth daily., Disp: , Rfl:    benzonatate (TESSALON) 100 MG capsule, Take 1 capsule (100 mg total) by mouth 3 (three) times daily as needed for cough., Disp: 30 capsule, Rfl: 0   Biotin 25366 MCG TABS, Take by mouth., Disp: , Rfl:    cetirizine (ZYRTEC) 10 MG tablet, TAKE 1 TABLET BY MOUTH EVERY DAY, Disp: 90 tablet, Rfl: 3   Cholecalciferol (VITAMIN D3) 50 MCG (2000 UT) TABS, Take by mouth., Disp: , Rfl:    doxycycline (VIBRA-TABS) 100 MG tablet, Take 1 tablet (100 mg total) by mouth 2 (two) times daily., Disp: 20 tablet, Rfl: 0   fluticasone (FLOVENT HFA) 110 MCG/ACT inhaler, Inhale 1 puff into the lungs in the morning and at bedtime., Disp: 1 each, Rfl: 12   GLUCOSAMINE CHONDROITIN COMPLX PO, Take by mouth daily., Disp: , Rfl:    Omega-3 Fatty Acids (FISH OIL DOUBLE STRENGTH) 1200 MG CAPS, Take by mouth 2 (two) times daily., Disp: , Rfl:    Probiotic Product (PROBIOTIC DAILY PO), Take by mouth., Disp: , Rfl:    rosuvastatin (CRESTOR) 10 MG tablet, TAKE 1 TABLET BY MOUTH EVERYDAY AT BEDTIME, Disp: 90 tablet, Rfl: 1   valACYclovir (VALTREX) 1000 MG tablet, TAKE ONE BY MOUTH TWICE A DAY FOR FIVE DAYS AS NEEDED FOR OUTBREAK, Disp: 30 tablet, Rfl: 1   vitamin E 400 UNIT capsule, Take 400 Units by mouth daily., Disp: , Rfl:    XIIDRA 5  % SOLN, , Disp: , Rfl:   Observations/Objective: Patient is well-developed, well-nourished in no acute distress.  Resting comfortably at home.  Head is normocephalic, atraumatic.  No labored breathing.  Speech is clear and coherent with logical content.  Patient is alert and oriented at baseline.    Assessment and Plan: 1. Acute bacterial bronchitis (Primary) - azithromycin (ZITHROMAX) 250 MG tablet; Take 2 tablets on day 1, then 1 tablet daily on days 2 through 5  Dispense: 6 tablet; Refill: 0 - predniSONE (DELTASONE) 20 MG tablet; Take 2 tablets (40 mg total) by mouth daily with breakfast.  Dispense: 10 tablet; Refill: 0 - promethazine-dextromethorphan (PROMETHAZINE-DM) 6.25-15 MG/5ML syrup; Take 5 mLs by mouth 4 (four) times daily as needed.  Dispense: 118 mL; Refill: 0  - STOP Doxycycline - Start Azithromycin - Refill Prednisone - Continue Tessalon perles - Add Promethazine DM (drowsiness precautions discussed) - Push fluids - Steam and Humidifier can help - Seek in person evaluation if not improving or if symptoms worsen  Follow Up Instructions: I discussed the assessment  and treatment plan with the patient. The patient was provided an opportunity to ask questions and all were answered. The patient agreed with the plan and demonstrated an understanding of the instructions.  A copy of instructions were sent to the patient via MyChart unless otherwise noted below.    The patient was advised to call back or seek an in-person evaluation if the symptoms worsen or if the condition fails to improve as anticipated.    Vanessa Loveless, PA-C

## 2023-05-07 DIAGNOSIS — M9901 Segmental and somatic dysfunction of cervical region: Secondary | ICD-10-CM | POA: Diagnosis not present

## 2023-05-07 DIAGNOSIS — M9905 Segmental and somatic dysfunction of pelvic region: Secondary | ICD-10-CM | POA: Diagnosis not present

## 2023-05-07 DIAGNOSIS — M9902 Segmental and somatic dysfunction of thoracic region: Secondary | ICD-10-CM | POA: Diagnosis not present

## 2023-05-07 DIAGNOSIS — M9903 Segmental and somatic dysfunction of lumbar region: Secondary | ICD-10-CM | POA: Diagnosis not present

## 2023-06-07 DIAGNOSIS — M9902 Segmental and somatic dysfunction of thoracic region: Secondary | ICD-10-CM | POA: Diagnosis not present

## 2023-06-07 DIAGNOSIS — M9905 Segmental and somatic dysfunction of pelvic region: Secondary | ICD-10-CM | POA: Diagnosis not present

## 2023-06-07 DIAGNOSIS — M9901 Segmental and somatic dysfunction of cervical region: Secondary | ICD-10-CM | POA: Diagnosis not present

## 2023-06-07 DIAGNOSIS — M9903 Segmental and somatic dysfunction of lumbar region: Secondary | ICD-10-CM | POA: Diagnosis not present

## 2023-07-02 ENCOUNTER — Encounter: Payer: Self-pay | Admitting: Family

## 2023-07-03 ENCOUNTER — Other Ambulatory Visit: Payer: Self-pay | Admitting: Family

## 2023-07-03 DIAGNOSIS — Z1231 Encounter for screening mammogram for malignant neoplasm of breast: Secondary | ICD-10-CM

## 2023-07-05 DIAGNOSIS — M9903 Segmental and somatic dysfunction of lumbar region: Secondary | ICD-10-CM | POA: Diagnosis not present

## 2023-07-05 DIAGNOSIS — M9901 Segmental and somatic dysfunction of cervical region: Secondary | ICD-10-CM | POA: Diagnosis not present

## 2023-07-05 DIAGNOSIS — M9902 Segmental and somatic dysfunction of thoracic region: Secondary | ICD-10-CM | POA: Diagnosis not present

## 2023-07-05 DIAGNOSIS — M9905 Segmental and somatic dysfunction of pelvic region: Secondary | ICD-10-CM | POA: Diagnosis not present

## 2023-07-17 ENCOUNTER — Other Ambulatory Visit: Payer: Self-pay | Admitting: Family

## 2023-07-24 ENCOUNTER — Ambulatory Visit: Payer: Medicare PPO | Admitting: Dermatology

## 2023-07-24 ENCOUNTER — Encounter: Payer: Self-pay | Admitting: Dermatology

## 2023-07-24 DIAGNOSIS — L821 Other seborrheic keratosis: Secondary | ICD-10-CM

## 2023-07-24 DIAGNOSIS — L578 Other skin changes due to chronic exposure to nonionizing radiation: Secondary | ICD-10-CM | POA: Diagnosis not present

## 2023-07-24 DIAGNOSIS — L719 Rosacea, unspecified: Secondary | ICD-10-CM

## 2023-07-24 DIAGNOSIS — W908XXA Exposure to other nonionizing radiation, initial encounter: Secondary | ICD-10-CM | POA: Diagnosis not present

## 2023-07-24 DIAGNOSIS — D225 Melanocytic nevi of trunk: Secondary | ICD-10-CM

## 2023-07-24 DIAGNOSIS — L820 Inflamed seborrheic keratosis: Secondary | ICD-10-CM

## 2023-07-24 DIAGNOSIS — L814 Other melanin hyperpigmentation: Secondary | ICD-10-CM

## 2023-07-24 DIAGNOSIS — D229 Melanocytic nevi, unspecified: Secondary | ICD-10-CM

## 2023-07-24 DIAGNOSIS — Z1283 Encounter for screening for malignant neoplasm of skin: Secondary | ICD-10-CM

## 2023-07-24 DIAGNOSIS — D2239 Melanocytic nevi of other parts of face: Secondary | ICD-10-CM | POA: Diagnosis not present

## 2023-07-24 DIAGNOSIS — Z85828 Personal history of other malignant neoplasm of skin: Secondary | ICD-10-CM

## 2023-07-24 DIAGNOSIS — D1801 Hemangioma of skin and subcutaneous tissue: Secondary | ICD-10-CM

## 2023-07-24 DIAGNOSIS — Z86018 Personal history of other benign neoplasm: Secondary | ICD-10-CM

## 2023-07-24 NOTE — Patient Instructions (Addendum)
 Cryotherapy Aftercare  Wash gently with soap and water  everyday.   Apply Vaseline Jelly daily until healed.    Rosacea:  Continue Skin Medicinals metronidazole/ivermectin /azelaic acid 1-2x daily as needed to affected areas on the face.   Recommend daily broad spectrum sunscreen SPF 30+ to sun-exposed areas, reapply every 2 hours as needed. Call for new or changing lesions.  Staying in the shade or wearing long sleeves, sun glasses (UVA+UVB protection) and wide brim hats (4-inch brim around the entire circumference of the hat) are also recommended for sun protection.     Melanoma ABCDEs  Melanoma is the most dangerous type of skin cancer, and is the leading cause of death from skin disease.  You are more likely to develop melanoma if you: Have light-colored skin, light-colored eyes, or red or blond hair Spend a lot of time in the sun Tan regularly, either outdoors or in a tanning bed Have had blistering sunburns, especially during childhood Have a close family member who has had a melanoma Have atypical moles or large birthmarks  Early detection of melanoma is key since treatment is typically straightforward and cure rates are extremely high if we catch it early.   The first sign of melanoma is often a change in a mole or a new dark spot.  The ABCDE system is a way of remembering the signs of melanoma.  A for asymmetry:  The two halves do not match. B for border:  The edges of the growth are irregular. C for color:  A mixture of colors are present instead of an even brown color. D for diameter:  Melanomas are usually (but not always) greater than 6mm - the size of a pencil eraser. E for evolution:  The spot keeps changing in size, shape, and color.  Please check your skin once per month between visits. You can use a small mirror in front and a large mirror behind you to keep an eye on the back side or your body.   If you see any new or changing lesions before your next follow-up,  please call to schedule a visit.  Please continue daily skin protection including broad spectrum sunscreen SPF 30+ to sun-exposed areas, reapplying every 2 hours as needed when you're outdoors.   Staying in the shade or wearing long sleeves, sun glasses (UVA+UVB protection) and wide brim hats (4-inch brim around the entire circumference of the hat) are also recommended for sun protection.      Due to recent changes in healthcare laws, you may see results of your pathology and/or laboratory studies on MyChart before the doctors have had a chance to review them. We understand that in some cases there may be results that are confusing or concerning to you. Please understand that not all results are received at the same time and often the doctors may need to interpret multiple results in order to provide you with the best plan of care or course of treatment. Therefore, we ask that you please give us  2 business days to thoroughly review all your results before contacting the office for clarification. Should we see a critical lab result, you will be contacted sooner.   If You Need Anything After Your Visit  If you have any questions or concerns for your doctor, please call our main line at [phone] and press option 4 to reach your doctor's medical assistant. If no one answers, please leave a voicemail as directed and we will return your call as soon as possible. Messages  left after 4 pm will be answered the following business day.   You may also send us  a message via MyChart. We typically respond to MyChart messages within 1-2 business days.  For prescription refills, please ask your pharmacy to contact our office. Our fax number is [phone].  If you have an urgent issue when the clinic is closed that cannot wait until the next business day, you can page your doctor at the number below.    Please note that while we do our best to be available for urgent issues outside of office hours, we are  not available 24/7.   If you have an urgent issue and are unable to reach us , you may choose to seek medical care at your doctor's office, retail clinic, urgent care center, or emergency room.  If you have a medical emergency, please immediately call 911 or go to the emergency department.  Pager Numbers  - Dr. Bary Likes: [phone]  - Dr. Annette Barters: [phone]  - Dr. Felipe Horton: [phone]   In the event of inclement weather, please call our main line at [phone] for an update on the status of any delays or closures.  Dermatology Medication Tips: Please keep the boxes that topical medications come in in order to help keep track of the instructions about where and how to use these. Pharmacies typically print the medication instructions only on the boxes and not directly on the medication tubes.   If your medication is too expensive, please contact our office at [phone] option 4 or send us  a message through MyChart.   We are unable to tell what your co-pay for medications will be in advance as this is different depending on your insurance coverage. However, we may be able to find a substitute medication at lower cost or fill out paperwork to get insurance to cover a needed medication.   If a prior authorization is required to get your medication covered by your insurance company, please allow us  1-2 business days to complete this process.  Drug prices often vary depending on where the prescription is filled and some pharmacies may offer cheaper prices.  The website www.goodrx.com contains coupons for medications through different pharmacies. The prices here do not account for what the cost may be with help from insurance (it may be cheaper with your insurance), but the website can give you the price if you did not use any insurance.  - You can print the associated coupon and take it with your prescription to the pharmacy.  - You may also stop by our office during regular business  hours and pick up a GoodRx coupon card.  - If you need your prescription sent electronically to a different pharmacy, notify our office through [facility] or by phone at [phone] option 4.     Si Usted Necesita Algo Despus de Su Visita  Tambin puede enviarnos un mensaje a travs de Clinical cytogeneticist. Por lo general respondemos a los mensajes de MyChart en el transcurso de 1 a 2 das hbiles.  Para renovar recetas, por favor pida a su farmacia que se ponga en contacto con nuestra oficina. Franz Jacks de fax es [place] [phone].  Si tiene un asunto urgente cuando la clnica est cerrada y que no puede esperar hasta el siguiente da hbil, puede llamar/localizar a su doctor(a) al nmero que aparece a continuacin.   Por favor, tenga en cuenta que aunque hacemos todo lo posible para estar disponibles para asuntos urgentes fuera del horario de [place], no  estamos disponibles las 24 horas del da, los 7 [address]  [address] de la [place].   Si tiene un problema urgente y no puede comunicarse con nosotros, puede optar por buscar atencin mdica  en el consultorio de su doctor(a), en una clnica privada, en un centro de atencin urgente o en una sala de emergencias.  Si tiene Engineer, drilling, por favor llame inmediatamente al 911 o vaya a la sala de emergencias.  Nmeros de bper  - Dr. Bary Likes: [phone]  - Dra. Annette Barters: [phone]  - Dr. Felipe Horton: [phone]   En caso de inclemencias del tiempo, por favor llame a Lajuan Pila principal al [phone] para una actualizacin sobre el [place] de cualquier retraso o cierre.  Consejos para la medicacin en dermatologa: Por favor, guarde las cajas en las que vienen los medicamentos de uso tpico para ayudarle a seguir las instrucciones sobre dnde y cmo usarlos. Las farmacias generalmente imprimen las instrucciones del medicamento slo en las cajas y no directamente en los tubos del [place].   Si su medicamento es muy caro, por favor,  pngase en contacto con Bettyjane Brunet llamando al [phone] y presione la opcin 4 o envenos un mensaje a travs de Clinical cytogeneticist.   No podemos decirle cul ser su copago por los medicamentos por adelantado ya que esto es diferente dependiendo de la cobertura de su seguro. Sin embargo, es posible que podamos encontrar un medicamento sustituto a Audiological scientist un formulario para que el seguro cubra el medicamento que se considera necesario.   Si se requiere una autorizacin previa para que su compaa de seguros [place] su medicamento, por favor permtanos de 1 a 2 das hbiles para completar este proceso.  Los precios de los medicamentos varan con frecuencia dependiendo del Environmental consultant de dnde se surte la receta y alguna farmacias pueden ofrecer precios ms baratos.  El sitio web www.goodrx.com tiene cupones para medicamentos de Health and safety inspector. Los precios aqu no tienen en cuenta lo que podra costar con la ayuda del seguro (puede ser ms barato con su seguro), pero el sitio web puede darle el precio si no utiliz Tourist information centre manager.  - Puede imprimir el cupn correspondiente y llevarlo con su receta a la farmacia.  - Tambin puede pasar por nuestra oficina durante el horario de atencin regular y Education officer, museum una tarjeta de cupones de GoodRx.  - Si necesita que su receta se enve electrnicamente a una farmacia diferente, informe a nuestra oficina a travs de MyChart de [place] o por telfono llamando al [phone] y presione la opcin 4.

## 2023-07-24 NOTE — Progress Notes (Signed)
 Follow-Up Visit   Subjective  Vanessa Walton is a 73 y.o. female who presents for the following: Skin Cancer Screening and Full Body Skin Exam. Hx BCC. Hx of dysplastic nevi.   Moles at left neck. Itches at times. Rubbed, irritated by clothing.   The patient presents for Total-Body Skin Exam (TBSE) for skin cancer screening and mole check. The patient has spots, moles and lesions to be evaluated, some may be new or changing and the patient may have concern these could be cancer.    The following portions of the chart were reviewed this encounter and updated as appropriate: medications, allergies, medical history  Review of Systems:  No other skin or systemic complaints except as noted in HPI or Assessment and Plan.  Objective  Well appearing patient in no apparent distress; mood and affect are within normal limits.  A full examination was performed including scalp, head, eyes, ears, nose, lips, neck, chest, axillae, abdomen, back, buttocks, bilateral upper extremities, bilateral lower extremities, hands, feet, fingers, toes, fingernails, and toenails. All findings within normal limits unless otherwise noted below.   Relevant physical exam findings are noted in the Assessment and Plan.  Left Anterior Lower Neck x4 (4) Erythematous keratotic or waxy stuck-on papule or plaque. Left Dorsal Hand x1 Stuck-on, waxy, tan-brown papule-- Discussed benign etiology and prognosis.    Assessment & Plan   SKIN CANCER SCREENING PERFORMED TODAY.  HISTORY OF BASAL CELL CARCINOMA OF THE SKIN. Left nasal dorsum , Mohs 04/15/20  - No evidence of recurrence today - Recommend regular full body skin exams - Recommend daily broad spectrum sunscreen SPF 30+ to sun-exposed areas, reapply every 2 hours as needed.  - Call if any new or changing lesions are noted between office visits  HISTORY OF DYSPLASTIC NEVUS. 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 Left Lower Back, severe atypia, EXC  03/21/2021  No evidence of recurrence today Recommend regular full body skin exams Recommend daily broad spectrum sunscreen SPF 30+ to sun-exposed areas, reapply every 2 hours as needed.  Call if any new or changing lesions are noted between office visits   ACTINIC DAMAGE - Chronic condition, secondary to cumulative UV/sun exposure - diffuse scaly erythematous macules with underlying dyspigmentation - Recommend daily broad spectrum sunscreen SPF 30+ to sun-exposed areas, reapply every 2 hours as needed.  - Staying in the shade or wearing long sleeves, sun glasses (UVA+UVB protection) and wide brim hats (4-inch brim around the entire circumference of the hat) are also recommended for sun protection.  - Call for new or changing lesions.  LENTIGINES, SEBORRHEIC KERATOSES, HEMANGIOMAS - Benign normal skin lesions - Benign-appearing - Call for any changes  MELANOCYTIC NEVI - Tan-brown and/or pink-flesh-colored symmetric macules and papules - 3 mm med dark brown macule at inferior umbilicus - 3.5 mm firm flesh smooth papule at left paranasal. Benign findings on dermoscopy. Stable compared to previous visit.  - Benign appearing on exam today - Observation - Call clinic for new or changing moles - Recommend daily use of broad spectrum spf 30+ sunscreen to sun-exposed areas.    ROSACEA Exam Face clear today   Chronic condition with duration or expected duration over one year. Currently well-controlled.     Rosacea is a chronic progressive skin condition usually affecting the face of adults, causing redness and/or acne bumps. It is treatable but not curable. It sometimes affects the eyes (ocular rosacea) as well. It may respond to topical and/or systemic medication and can flare with  stress, sun exposure, alcohol, exercise, topical steroids (including hydrocortisone /cortisone 10) and some foods.  Daily application of broad spectrum spf 30+ sunscreen to face is recommended to reduce flares.    Denies eye symptoms today   Treatment Plan Continue Skin Medicinals metronidazole/ivermectin /azelaic acid 1-2x daily as needed to affected areas on the face. The patient was advised this is not covered by insurance since it is made by a compounding pharmacy. They will receive an email to check out and the medication will be mailed to their home.       INFLAMED SEBORRHEIC KERATOSIS (4) Left Anterior Lower Neck x4 (4) Symptomatic, irritating, patient would like treated. Destruction of lesion - Left Anterior Lower Neck x4 (4)  Destruction method: cryotherapy   Informed consent: discussed and consent obtained   Lesion destroyed using liquid nitrogen: Yes   Region frozen until ice ball extended beyond lesion: Yes   Outcome: patient tolerated procedure well with no complications   Post-procedure details: wound care instructions given   Additional details:  Prior to procedure, discussed risks of blister formation, small wound, skin dyspigmentation, or rare scar following cryotherapy. Recommend Vaseline ointment to treated areas while healing.  SEBORRHEIC KERATOSIS Left Dorsal Hand x1 Reassured benign age-related growth.  Recommend observation.  Discussed cryotherapy if spot(s) become irritated or inflamed.   Destruction of lesion - Left Dorsal Hand x1  Destruction method: cryotherapy   Informed consent: discussed and consent obtained   Lesion destroyed using liquid nitrogen: Yes   Region frozen until ice ball extended beyond lesion: Yes   Outcome: patient tolerated procedure well with no complications   Post-procedure details: wound care instructions given   Additional details:  Prior to procedure, discussed risks of blister formation, small wound, skin dyspigmentation, or rare scar following cryotherapy. Recommend Vaseline ointment to treated areas while healing.  Return in about 1 year (around 07/23/2024) for TBSE, HxBCC. HxDN.  I, Jill Parcell, CMA, am acting as scribe for Artemio Larry, MD.   Documentation: I have reviewed the above documentation for accuracy and completeness, and I agree with the above.  Artemio Larry, MD

## 2023-08-02 DIAGNOSIS — M9902 Segmental and somatic dysfunction of thoracic region: Secondary | ICD-10-CM | POA: Diagnosis not present

## 2023-08-02 DIAGNOSIS — M9903 Segmental and somatic dysfunction of lumbar region: Secondary | ICD-10-CM | POA: Diagnosis not present

## 2023-08-02 DIAGNOSIS — M9905 Segmental and somatic dysfunction of pelvic region: Secondary | ICD-10-CM | POA: Diagnosis not present

## 2023-08-02 DIAGNOSIS — M9901 Segmental and somatic dysfunction of cervical region: Secondary | ICD-10-CM | POA: Diagnosis not present

## 2023-08-15 ENCOUNTER — Encounter

## 2023-08-20 ENCOUNTER — Ambulatory Visit
Admission: RE | Admit: 2023-08-20 | Discharge: 2023-08-20 | Disposition: A | Discharge status: Home / Self Care | Source: Ambulatory Visit | Attending: Family | Admitting: Family

## 2023-08-20 DIAGNOSIS — Z1231 Encounter for screening mammogram for malignant neoplasm of breast: Secondary | ICD-10-CM | POA: Insufficient documentation

## 2023-08-22 ENCOUNTER — Ambulatory Visit: Payer: Self-pay | Admitting: Family

## 2023-08-27 DIAGNOSIS — M9902 Segmental and somatic dysfunction of thoracic region: Secondary | ICD-10-CM | POA: Diagnosis not present

## 2023-08-27 DIAGNOSIS — M9905 Segmental and somatic dysfunction of pelvic region: Secondary | ICD-10-CM | POA: Diagnosis not present

## 2023-08-27 DIAGNOSIS — M9901 Segmental and somatic dysfunction of cervical region: Secondary | ICD-10-CM | POA: Diagnosis not present

## 2023-08-27 DIAGNOSIS — M9903 Segmental and somatic dysfunction of lumbar region: Secondary | ICD-10-CM | POA: Diagnosis not present

## 2023-09-19 ENCOUNTER — Ambulatory Visit
Admission: EM | Admit: 2023-09-19 | Discharge: 2023-09-19 | Disposition: A | Discharge status: Home / Self Care | Attending: Emergency Medicine | Admitting: Emergency Medicine

## 2023-09-19 ENCOUNTER — Encounter: Payer: Self-pay | Admitting: Emergency Medicine

## 2023-09-19 DIAGNOSIS — W57XXXA Bitten or stung by nonvenomous insect and other nonvenomous arthropods, initial encounter: Secondary | ICD-10-CM | POA: Diagnosis not present

## 2023-09-19 DIAGNOSIS — L989 Disorder of the skin and subcutaneous tissue, unspecified: Secondary | ICD-10-CM

## 2023-09-19 DIAGNOSIS — S30860A Insect bite (nonvenomous) of lower back and pelvis, initial encounter: Secondary | ICD-10-CM | POA: Diagnosis not present

## 2023-09-19 DIAGNOSIS — H35363 Drusen (degenerative) of macula, bilateral: Secondary | ICD-10-CM | POA: Diagnosis not present

## 2023-09-19 MED ORDER — DOXYCYCLINE HYCLATE 100 MG PO CAPS
100.0000 mg | ORAL_CAPSULE | Freq: Two times a day (BID) | ORAL | 0 refills | Status: DC
Start: 2023-09-19 — End: 2023-12-03

## 2023-09-19 NOTE — Discharge Instructions (Addendum)
 Your tick related lab work is pending.  Take the doxycycline  as directed.  Follow-up with your primary care provider or your dermatologist.

## 2023-09-19 NOTE — ED Triage Notes (Signed)
 Patient in office today complaint of tick bite more than six weeks ago located in center of back states it does itch no bull eye noticeable. Per patient husband removed tick .   OTC: neosporin and peroxide  Denies: fever, diarrhea,nausea,vomiting or dizziness

## 2023-09-19 NOTE — ED Provider Notes (Signed)
 CAY RALPH PELT    CSN: [id number] Arrival date & time: 09/19/23  1306      History   Chief Complaint Chief Complaint  Patient presents with   Tick Removal    HPI Vanessa Walton is a 73 y.o. female.  Patient presents with a small sore on her right upper back x 6 weeks which started with a tick bite.  Her husband removed the tick at that time.  The sore is extremely pruritic.  No fever, rash, wound drainage, malaise, headache, nausea, vomiting, arthralgias.  Patient has been treating the sore with Neosporin.  Her medical history includes basal cell carcinoma; she is followed by dermatology; last seen on 07/24/2023.  The history is provided by the patient and medical records.    Past Medical History:  Diagnosis Date   Arthritis    right index finger   Basal cell carcinoma 02/24/2020   L nasal dorsum, MOHs 04/15/20   Dysplastic nevus 12/27/2020   Spinal mid upper back, Severe atypia, excised 03/14/2021   Dysplastic nevus 12/27/2020   Left Lower Back, severe atypia, EXC 03/21/2021   GERD (gastroesophageal reflux disease)    Herpes simplex virus (HSV) infection    Hyperlipidemia    Meniere disease 02/13/2019   Meniere's disease     Patient Active Problem List   Diagnosis Date Noted   Polyp of descending colon 02/10/2022   History of colonic polyps 12/15/2021   Kidney lesion, native, left 12/13/2021   Cyst of right kidney 12/13/2021   Seasonal allergic rhinitis 12/09/2021   Bronchospasm 12/09/2021   Positive hepatitis C antibody test 10/21/2021   Postmenopausal 10/21/2021   Elevated liver function tests 10/21/2021   Chronically dry eyes, 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   Rosacea, unspecified 05/24/2021   Chronic Eustachian tube dysfunction, right 05/24/2021   Benign paroxysmal positional vertigo of right ear 05/30/2020   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   HLD (hyperlipidemia) 08/01/2013    Past Surgical History:   Procedure Laterality Date   CESAREAN SECTION     COLONOSCOPY WITH PROPOFOL  N/A 08/22/2017   Procedure: COLONOSCOPY WITH PROPOFOL ;  Surgeon: Unk Corinn Skiff, MD;  Location: [facility];  Service: Endoscopy;  Laterality: N/A;   COLONOSCOPY WITH PROPOFOL  N/A 02/10/2022   Procedure: COLONOSCOPY WITH PROPOFOL ;  Surgeon: Unk Corinn Skiff, MD;  Location: [facility] END[facility];  Service: Gastroenterology;  Laterality: N/A;   POLYPECTOMY  08/22/2017   Procedure: POLYPECTOMY;  Surgeon: Unk Corinn Skiff, MD;  Location: [facility];  Service: Endoscopy;;   TONSILLECTOMY      OB History     Gravida  2   Para  2   Term  2   Preterm      AB      Living  2      SAB      IAB      Ectopic      Multiple      Live Births  2            Home Medications    Prior to Admission medications   Medication Sig Start Date End Date Taking? Authorizing Provider  doxycycline  (VIBRAMYCIN ) 100 MG capsule Take 1 capsule (100 mg total) by mouth 2 (two) times daily. 09/19/23  Yes Corlis Burnard DEL, NP  B Complex Vitamins (B COMPLEX PO) Take by mouth daily.    [provider]  benzonatate  (TESSALON ) 100 MG capsule Take 1 capsule (100 mg total)  by mouth 3 (three) times daily as needed for cough. 04/24/23   Gladis Elsie BROCKS, PA-C  Biotin 89999 MCG TABS Take by mouth.    [provider]  cetirizine  (ZYRTEC ) 10 MG tablet TAKE 1 TABLET BY MOUTH EVERY DAY 11/28/22   Dugal, Tabitha, FNP  Cholecalciferol (VITAMIN D3) 50 MCG (2000 UT) TABS Take by mouth.    [provider]  fluticasone  (FLOVENT  HFA) 110 MCG/ACT inhaler Inhale 1 puff into the lungs in the morning and at bedtime. 06/21/22   Corwin Antu, FNP  GLUCOSAMINE CHONDROITIN COMPLX PO Take by mouth daily.    [provider]  Omega-3 Fatty Acids (FISH OIL DOUBLE STRENGTH) 1200 MG CAPS Take by mouth 2 (two) times daily.    [provider]  predniSONE  (DELTASONE ) 20 MG tablet Take 2 tablets (40 mg  total) by mouth daily with breakfast. 04/30/23   Vivienne Delon HERO, PA-C  Probiotic Product (PROBIOTIC DAILY PO) Take by mouth.    [provider]  promethazine -dextromethorphan (PROMETHAZINE -DM) 6.25-15 MG/5ML syrup Take 5 mLs by mouth 4 (four) times daily as needed. 04/30/23   Vivienne Delon HERO, PA-C  rosuvastatin  (CRESTOR ) 10 MG tablet TAKE 1 TABLET BY MOUTH EVERYDAY AT BEDTIME 07/17/23   Dugal, Antu, FNP  valACYclovir  (VALTREX ) 1000 MG tablet TAKE ONE BY MOUTH TWICE A DAY FOR FIVE DAYS AS NEEDED FOR OUTBREAK 08/17/22   Corwin Antu, FNP  vitamin E 400 UNIT capsule Take 400 Units by mouth daily.    [provider]  SANFORD 5 % SOLN  10/27/19   [provider]    Family History Family History  Problem Relation Age of Onset   Arthritis Mother    Hyperlipidemia Mother    Hyperlipidemia Father    Breast cancer Neg Hx     Social History Social History   Tobacco Use   Smoking status: Never   Smokeless tobacco: Never   Tobacco comments:    smoked socially as teenager  Vaping Use   Vaping status: Never Used  Substance Use Topics   Alcohol use: Yes    Alcohol/week: 6.0 standard drinks of alcohol    Types: 6 Glasses of wine per week    Comment: moderate   Drug use: No     Allergies   Astelin [azelastine] and Flonase  [fluticasone ]   Review of Systems Review of Systems  Constitutional:  Negative for chills and fever.  Gastrointestinal:  Negative for nausea and vomiting.  Musculoskeletal:  Negative for arthralgias and joint swelling.  Skin:  Positive for color change and wound. Negative for rash.  Neurological:  Negative for dizziness and weakness.     Physical Exam Triage Vital Signs ED Triage Vitals  Encounter Vitals Group     BP 09/19/23 1324 112/76     Girls Systolic BP Percentile --      Girls Diastolic BP Percentile --      Boys Systolic BP Percentile --      Boys Diastolic BP Percentile --      Pulse Rate 09/19/23 1324 79      Resp 09/19/23 1324 16     Temp 09/19/23 1324 98.3 F (36.8 C)     Temp Source 09/19/23 1324 Oral     SpO2 09/19/23 1324 98 %     Weight 09/19/23 1322 140 lb (63.5 kg)     Height 09/19/23 1322 4' 11 (1.499 m)     Head Circumference --      Peak Flow --  Pain Score 09/19/23 1322 0     Pain Loc --      Pain Education --      Exclude from Growth Chart --    No data found.  Updated Vital Signs BP 112/76   Pulse 79   Temp 98.3 F (36.8 C) (Oral)   Resp 16   Ht 4' 11 (1.499 m)   Wt 140 lb (63.5 kg)   SpO2 98%   BMI 28.28 kg/m   Visual Acuity Right Eye Distance:   Left Eye Distance:   Bilateral Distance:    Right Eye Near:   Left Eye Near:    Bilateral Near:     Physical Exam Constitutional:      General: She is not in acute distress. HENT:     Mouth/Throat:     Mouth: Mucous membranes are moist.  Cardiovascular:     Rate and Rhythm: Normal rate and regular rhythm.  Pulmonary:     Effort: Pulmonary effort is normal. No respiratory distress.  Skin:    General: Skin is warm and dry.     Findings: Lesion present. No rash.     Comments: Small open wound on right upper back. No foreign body noted.  No drainage or bleeding. The wound is pink but no surrounding rash.   Neurological:     Mental Status: She is alert.      UC Treatments / Results  Labs (all labs ordered are listed, but only abnormal results are displayed) Labs Reviewed  LYME DISEASE SEROLOGY W/REFLEX  SPOTTED FEVER GROUP ANTIBODIES    EKG   Radiology No results found.  Procedures Procedures (including critical care time)  Medications Ordered in UC Medications - No data to display  Initial Impression / Assessment and Plan / UC Course  I have reviewed the triage vital signs and the nursing notes.  Pertinent labs & imaging results that were available during my care of the patient were reviewed by me and considered in my medical decision making (see chart for details).    Skin lesion  at the site of a tick bite 6 weeks ago.  Afebrile and vital signs are stable.  Patient has a wound at the site of a tick bite that occurred approximately 6 weeks ago.  Treating today with doxycycline .  Lyme and RMSF pending.  Education provided on tick bites.  Instructed patient to follow-up with her PCP or dermatologist.  She agrees to plan of care.  Final Clinical Impressions(s) / UC Diagnoses   Final diagnoses:  Skin lesion  Tick bite of back, initial encounter     Discharge Instructions      Your tick related lab work is pending.  Take the doxycycline  as directed.  Follow-up with your primary care provider or your dermatologist.     ED Prescriptions     Medication Sig Dispense Auth. Provider   doxycycline  (VIBRAMYCIN ) 100 MG capsule Take 1 capsule (100 mg total) by mouth 2 (two) times daily. 20 capsule Corlis Burnard DEL, NP      PDMP not reviewed this encounter.   Corlis Burnard DEL, NP 09/19/23 1414

## 2023-09-22 LAB — SPOTTED FEVER GROUP ANTIBODIES
Spotted Fever Group IgG: <1:64 {titer}
Spotted Fever Group IgM: <1:64 {titer}

## 2023-09-22 LAB — LYME DISEASE SEROLOGY W/REFLEX: Lyme Total Antibody EIA: NEGATIVE

## 2023-09-24 DIAGNOSIS — M9901 Segmental and somatic dysfunction of cervical region: Secondary | ICD-10-CM | POA: Diagnosis not present

## 2023-09-24 DIAGNOSIS — M9902 Segmental and somatic dysfunction of thoracic region: Secondary | ICD-10-CM | POA: Diagnosis not present

## 2023-09-24 DIAGNOSIS — M9903 Segmental and somatic dysfunction of lumbar region: Secondary | ICD-10-CM | POA: Diagnosis not present

## 2023-09-24 DIAGNOSIS — M9905 Segmental and somatic dysfunction of pelvic region: Secondary | ICD-10-CM | POA: Diagnosis not present

## 2023-10-16 DIAGNOSIS — M9902 Segmental and somatic dysfunction of thoracic region: Secondary | ICD-10-CM | POA: Diagnosis not present

## 2023-10-16 DIAGNOSIS — M9905 Segmental and somatic dysfunction of pelvic region: Secondary | ICD-10-CM | POA: Diagnosis not present

## 2023-10-16 DIAGNOSIS — M9901 Segmental and somatic dysfunction of cervical region: Secondary | ICD-10-CM | POA: Diagnosis not present

## 2023-10-16 DIAGNOSIS — M9903 Segmental and somatic dysfunction of lumbar region: Secondary | ICD-10-CM | POA: Diagnosis not present

## 2023-10-25 IMAGING — MG MM DIGITAL SCREENING BILAT W/ TOMO AND CAD
6 of 10 series · 6 of 30 positions shown · non-contrast
Comparison: Previous exam(s).

CLINICAL DATA: Screening.

EXAM:
DIGITAL SCREENING BILATERAL MAMMOGRAM WITH TOMOSYNTHESIS AND CAD
TECHNIQUE: Bilateral screening digital craniocaudal and mediolateral oblique
mammograms were obtained. Bilateral screening digital breast
tomosynthesis was performed.

[R CC synth-2D]
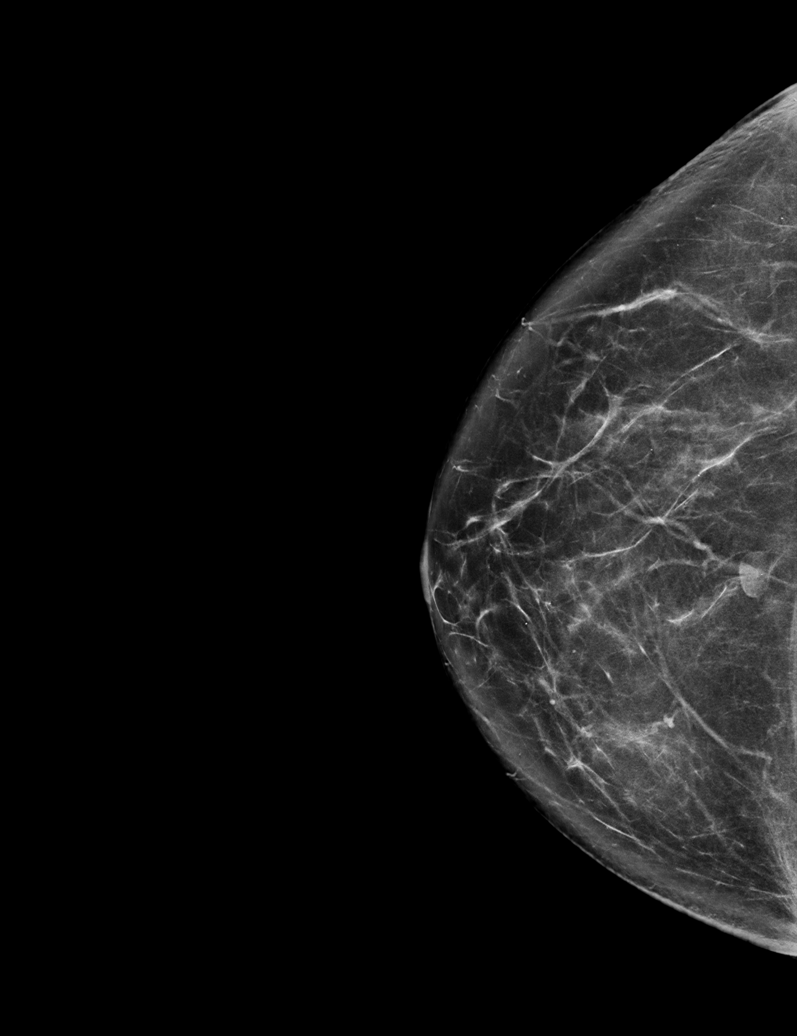

[R MLO synth-2D]
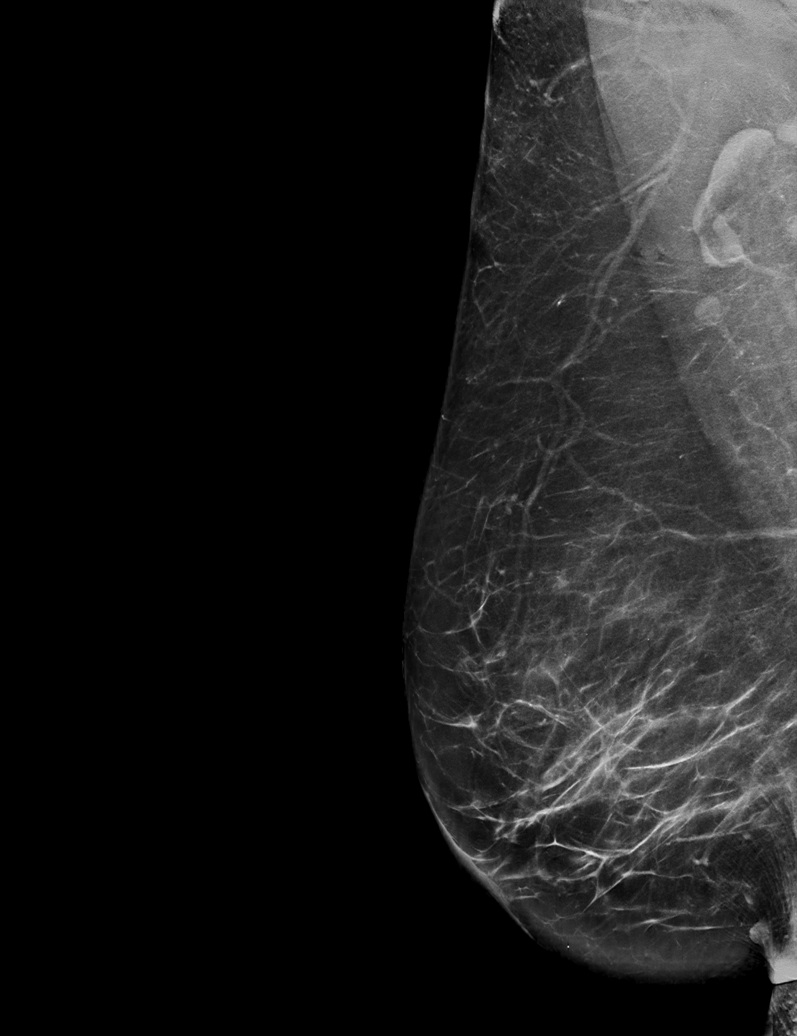

[L MLO synth-2D (1 of 2)]
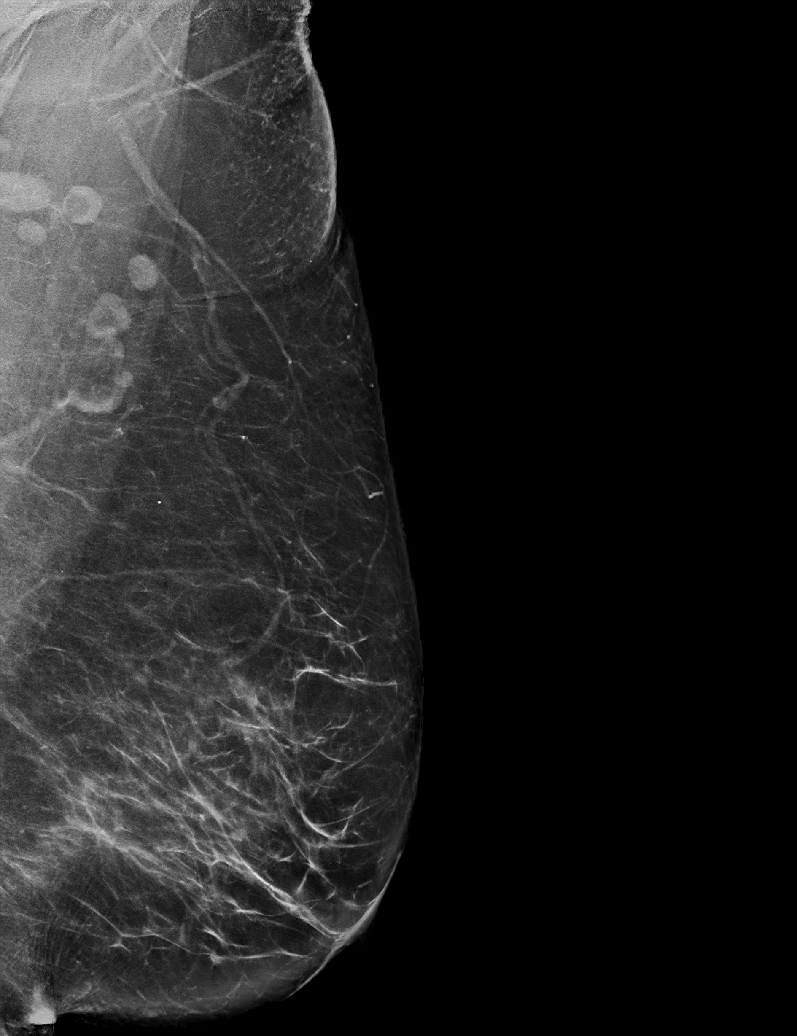

[L MLO synth-2D (2 of 2)]
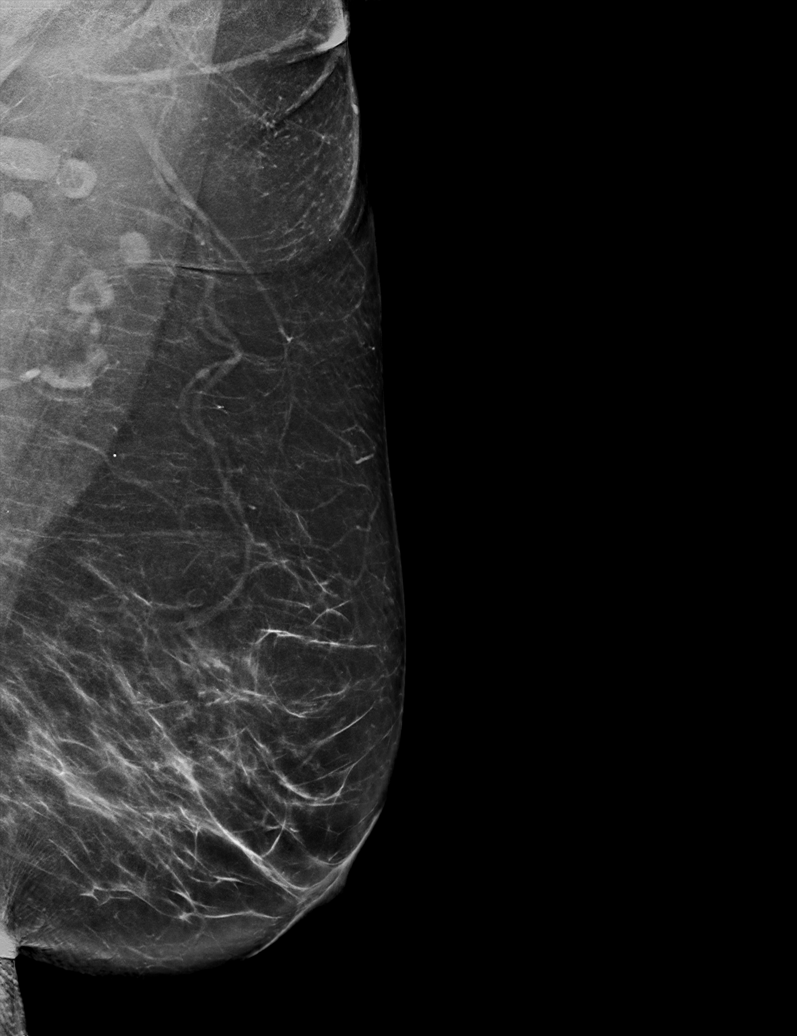

[L CC synth-2D]
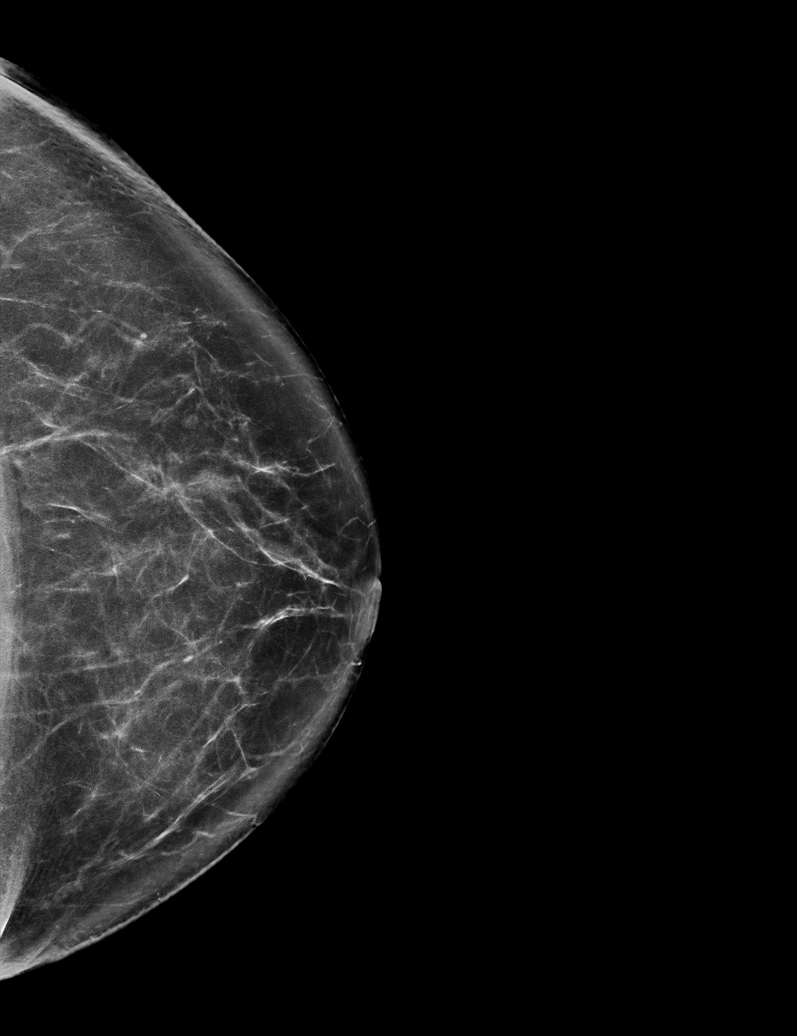

[R CC tomo · tomo slice 37/74.0]
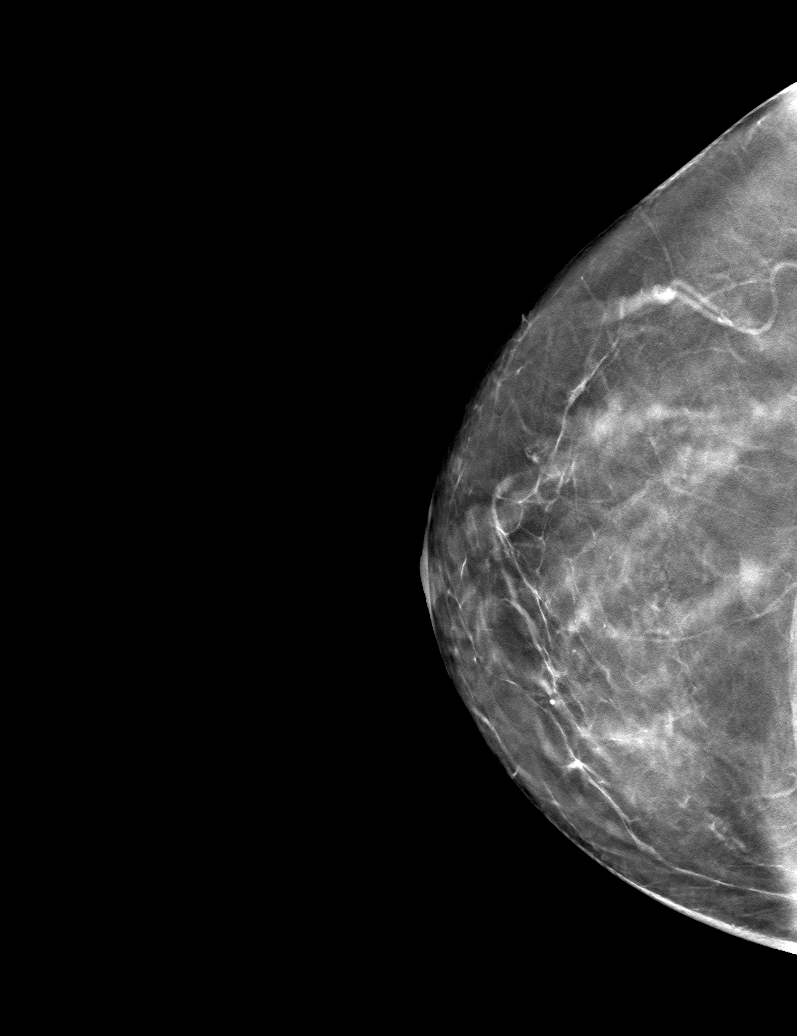

[6 of 30 positions shown; findings below may reference images not displayed]

ACR Breast Density Category b: There are scattered areas of
fibroglandular density.
FINDINGS: There are no findings suspicious for malignancy.
IMPRESSION: No mammographic evidence of malignancy. A result letter of this
screening mammogram will be mailed directly to the patient.

RECOMMENDATION:
Screening mammogram in one year. (Code:[id number]-[id number])

BI-RADS CATEGORY  1: Negative.

## 2023-10-29 ENCOUNTER — Other Ambulatory Visit: Payer: Self-pay | Admitting: Family

## 2023-10-29 DIAGNOSIS — J302 Other seasonal allergic rhinitis: Secondary | ICD-10-CM

## 2023-11-06 ENCOUNTER — Encounter: Payer: Self-pay | Admitting: Family

## 2023-11-06 DIAGNOSIS — M9903 Segmental and somatic dysfunction of lumbar region: Secondary | ICD-10-CM | POA: Diagnosis not present

## 2023-11-06 DIAGNOSIS — M9901 Segmental and somatic dysfunction of cervical region: Secondary | ICD-10-CM | POA: Diagnosis not present

## 2023-11-06 DIAGNOSIS — M9902 Segmental and somatic dysfunction of thoracic region: Secondary | ICD-10-CM | POA: Diagnosis not present

## 2023-11-06 DIAGNOSIS — M9905 Segmental and somatic dysfunction of pelvic region: Secondary | ICD-10-CM | POA: Diagnosis not present

## 2023-11-09 ENCOUNTER — Encounter: Payer: Self-pay | Admitting: Family

## 2023-11-09 DIAGNOSIS — Z2989 Encounter for other specified prophylactic measures: Secondary | ICD-10-CM | POA: Diagnosis not present

## 2023-11-09 DIAGNOSIS — Z789 Other specified health status: Secondary | ICD-10-CM | POA: Diagnosis not present

## 2023-11-09 DIAGNOSIS — Z23 Encounter for immunization: Secondary | ICD-10-CM | POA: Diagnosis not present

## 2023-12-03 ENCOUNTER — Ambulatory Visit
Admission: EM | Admit: 2023-12-03 | Discharge: 2023-12-03 | Disposition: A | Discharge status: Home / Self Care | Attending: Emergency Medicine | Admitting: Emergency Medicine

## 2023-12-03 DIAGNOSIS — J0110 Acute frontal sinusitis, unspecified: Secondary | ICD-10-CM | POA: Diagnosis not present

## 2023-12-03 DIAGNOSIS — R051 Acute cough: Secondary | ICD-10-CM | POA: Diagnosis not present

## 2023-12-03 MED ORDER — BENZONATATE 100 MG PO CAPS: 100.0000 mg | ORAL_CAPSULE | Freq: Three times a day (TID) | ORAL | 0 refills | Status: DC | PRN

## 2023-12-03 MED ORDER — AZITHROMYCIN 250 MG PO TABS: 250.0000 mg | ORAL_TABLET | Freq: Every day | ORAL | 0 refills | Status: DC

## 2023-12-03 NOTE — Discharge Instructions (Addendum)
 Take the Zithromax and Tessalon Perles as directed.    Follow up with your primary care provider if your symptoms are not improving.

## 2023-12-03 NOTE — ED Provider Notes (Signed)
 CAY RALPH PELT    CSN: [id number] Arrival date & time: 12/03/23  1253      History   Chief Complaint Chief Complaint  Patient presents with   Headache   Cough   Nasal Congestion    HPI Vanessa Walton is a 73 y.o. female.  Patient presents with >1 week history of congestion, sinus pressure, and cough.  No fever or shortness of breath.  Treatment attempted with Tessalon  Perles and Flovent  inhaler.  The history is provided by the patient and medical records.    Past Medical History:  Diagnosis Date   Arthritis    right index finger   Basal cell carcinoma 02/24/2020   L nasal dorsum, MOHs 04/15/20   Dysplastic nevus 12/27/2020   Spinal mid upper back, Severe atypia, excised 03/14/2021   Dysplastic nevus 12/27/2020   Left Lower Back, severe atypia, EXC 03/21/2021   GERD (gastroesophageal reflux disease)    Herpes simplex virus (HSV) infection    Hyperlipidemia    Meniere disease 02/13/2019   Meniere's disease     Patient Active Problem List   Diagnosis Date Noted   Polyp of descending colon 02/10/2022   History of colonic polyps 12/15/2021   Kidney lesion, native, left 12/13/2021   Cyst of right kidney 12/13/2021   Seasonal allergic rhinitis 12/09/2021   Bronchospasm 12/09/2021   Positive hepatitis C antibody test 10/21/2021   Postmenopausal 10/21/2021   Elevated liver function tests 10/21/2021   Chronically dry eyes, 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   Rosacea, unspecified 05/24/2021   Chronic Eustachian tube dysfunction, right 05/24/2021   Benign paroxysmal positional vertigo of right ear 05/30/2020   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   HLD (hyperlipidemia) 08/01/2013    Past Surgical History:  Procedure Laterality Date   CESAREAN SECTION     COLONOSCOPY WITH PROPOFOL  N/A 08/22/2017   Procedure: COLONOSCOPY WITH PROPOFOL ;  Surgeon: Unk Corinn Skiff, MD;  Location: [facility];  Service:  Endoscopy;  Laterality: N/A;   COLONOSCOPY WITH PROPOFOL  N/A 02/10/2022   Procedure: COLONOSCOPY WITH PROPOFOL ;  Surgeon: Unk Corinn Skiff, MD;  Location: [facility] END[facility]COPY;  Service: Gastroenterology;  Laterality: N/A;   POLYPECTOMY  08/22/2017   Procedure: POLYPECTOMY;  Surgeon: Unk Corinn Skiff, MD;  Location: [facility];  Service: Endoscopy;;   TONSILLECTOMY      OB History     Gravida  2   Para  2   Term  2   Preterm      AB      Living  2      SAB      IAB      Ectopic      Multiple      Live Births  2            Home Medications    Prior to Admission medications   Medication Sig Start Date End Date Taking? Authorizing Provider  azithromycin  (ZITHROMAX ) 250 MG tablet Take 1 tablet (250 mg total) by mouth daily. Take first 2 tablets together, then 1 every day until finished. 12/03/23  Yes Corlis Burnard DEL, NP  benzonatate  (TESSALON ) 100 MG capsule Take 1 capsule (100 mg total) by mouth 3 (three) times daily as needed for cough. 12/03/23  Yes Corlis Burnard DEL, NP  fluticasone  (FLOVENT  HFA) 110 MCG/ACT inhaler INHALE 1 PUFF INTO THE LUNGS IN THE MORNING AND AT BEDTIME. 10/29/23  Yes Corwin Antu, FNP  B Complex Vitamins (B  COMPLEX PO) Take by mouth daily.    [provider]  Biotin 89999 MCG TABS Take by mouth.    [provider]  cetirizine  (ZYRTEC ) 10 MG tablet TAKE 1 TABLET BY MOUTH EVERY DAY 11/28/22   Dugal, Tabitha, FNP  Cholecalciferol (VITAMIN D3) 50 MCG (2000 UT) TABS Take by mouth.    [provider]  GLUCOSAMINE CHONDROITIN COMPLX PO Take by mouth daily.    [provider]  Omega-3 Fatty Acids (FISH OIL DOUBLE STRENGTH) 1200 MG CAPS Take by mouth 2 (two) times daily.    [provider]  predniSONE  (DELTASONE ) 20 MG tablet Take 2 tablets (40 mg total) by mouth daily with breakfast. 04/30/23   Vivienne Delon HERO, PA-C  Probiotic Product (PROBIOTIC DAILY PO) Take by mouth.    [provider]   promethazine -dextromethorphan (PROMETHAZINE -DM) 6.25-15 MG/5ML syrup Take 5 mLs by mouth 4 (four) times daily as needed. 04/30/23   Vivienne Delon HERO, PA-C  rosuvastatin  (CRESTOR ) 10 MG tablet TAKE 1 TABLET BY MOUTH EVERYDAY AT BEDTIME 07/17/23   Dugal, Ginger, FNP  valACYclovir  (VALTREX ) 1000 MG tablet TAKE ONE BY MOUTH TWICE A DAY FOR FIVE DAYS AS NEEDED FOR OUTBREAK 08/17/22   Corwin Ginger, FNP  vitamin E 400 UNIT capsule Take 400 Units by mouth daily.    [provider]  SANFORD 5 % SOLN  10/27/19   [provider]    Family History Family History  Problem Relation Age of Onset   Arthritis Mother    Hyperlipidemia Mother    Hyperlipidemia Father    Breast cancer Neg Hx     Social History Social History   Tobacco Use   Smoking status: Never   Smokeless tobacco: Never   Tobacco comments:    smoked socially as teenager  Vaping Use   Vaping status: Never Used  Substance Use Topics   Alcohol use: Yes    Alcohol/week: 6.0 standard drinks of alcohol    Types: 6 Glasses of wine per week    Comment: moderate   Drug use: No     Allergies   Astelin [azelastine] and Flonase  [fluticasone ]   Review of Systems Review of Systems  Constitutional:  Negative for chills and fever.  HENT:  Positive for congestion and sinus pressure. Negative for ear pain and sore throat.   Respiratory:  Positive for cough. Negative for shortness of breath.      Physical Exam Triage Vital Signs ED Triage Vitals  Encounter Vitals Group     BP 12/03/23 1316 122/80     Girls Systolic BP Percentile --      Girls Diastolic BP Percentile --      Boys Systolic BP Percentile --      Boys Diastolic BP Percentile --      Pulse Rate 12/03/23 1316 76     Resp 12/03/23 1316 18     Temp 12/03/23 1316 98.7 F (37.1 C)     Temp src --      SpO2 12/03/23 1316 97 %     Weight --      Height --      Head Circumference --      Peak Flow --      Pain Score 12/03/23 1319 7     Pain Loc  --      Pain Education --      Exclude from Growth Chart --    No data found.  Updated Vital Signs BP 122/80  Pulse 76   Temp 98.7 F (37.1 C)   Resp 18   SpO2 97%   Visual Acuity Right Eye Distance:   Left Eye Distance:   Bilateral Distance:    Right Eye Near:   Left Eye Near:    Bilateral Near:     Physical Exam Constitutional:      General: She is not in acute distress. HENT:     Right Ear: Tympanic membrane normal.     Left Ear: Tympanic membrane normal.     Nose: Congestion present.     Mouth/Throat:     Mouth: Mucous membranes are moist.     Pharynx: Oropharynx is clear.  Cardiovascular:     Rate and Rhythm: Normal rate and regular rhythm.     Heart sounds: Normal heart sounds.  Pulmonary:     Effort: Pulmonary effort is normal. No respiratory distress.     Breath sounds: Normal breath sounds.  Neurological:     Mental Status: She is alert.      UC Treatments / Results  Labs (all labs ordered are listed, but only abnormal results are displayed) Labs Reviewed - No data to display  EKG   Radiology No results found.  Procedures Procedures (including critical care time)  Medications Ordered in UC Medications - No data to display  Initial Impression / Assessment and Plan / UC Course  I have reviewed the triage vital signs and the nursing notes.  Pertinent labs & imaging results that were available during my care of the patient were reviewed by me and considered in my medical decision making (see chart for details).   Sinusitis, cough.  Afebrile and vital signs are stable.  Lungs are clear and O2 sat is 97% on room air.  Treating today with Zithromax  as patient reports this works best for her.  Also treating with Tessalon  Perles for cough.  Education provided on sinus infection.  Instructed patient to follow-up with her PCP if she is not improving.  She agrees to plan of care.    Final Clinical Impressions(s) / UC Diagnoses   Final diagnoses:   Acute non-recurrent frontal sinusitis  Acute cough     Discharge Instructions      Take the Zithromax  and Tessalon  Perles as directed.  Follow-up with your primary care provider if your symptoms are not improving.      ED Prescriptions     Medication Sig Dispense Auth. Provider   azithromycin  (ZITHROMAX ) 250 MG tablet Take 1 tablet (250 mg total) by mouth daily. Take first 2 tablets together, then 1 every day until finished. 6 tablet Corlis Burnard DEL, NP   benzonatate  (TESSALON ) 100 MG capsule Take 1 capsule (100 mg total) by mouth 3 (three) times daily as needed for cough. 21 capsule Corlis Burnard DEL, NP      PDMP not reviewed this encounter.   Corlis Burnard DEL, NP 12/03/23 1349

## 2023-12-03 NOTE — ED Triage Notes (Signed)
 Patient to Urgent Care with complaints of headaches/ chest congestion/ cough/ sinus pain.  Symptoms for at least 8 days. Negative home covid test.   Using her flovent  inhaler BID/ benzonatate  100mg .

## 2024-01-03 DIAGNOSIS — M9901 Segmental and somatic dysfunction of cervical region: Secondary | ICD-10-CM | POA: Diagnosis not present

## 2024-01-03 DIAGNOSIS — M9903 Segmental and somatic dysfunction of lumbar region: Secondary | ICD-10-CM | POA: Diagnosis not present

## 2024-01-03 DIAGNOSIS — M9902 Segmental and somatic dysfunction of thoracic region: Secondary | ICD-10-CM | POA: Diagnosis not present

## 2024-01-03 DIAGNOSIS — M9905 Segmental and somatic dysfunction of pelvic region: Secondary | ICD-10-CM | POA: Diagnosis not present

## 2024-01-12 ENCOUNTER — Other Ambulatory Visit: Payer: Self-pay | Admitting: Family

## 2024-01-12 DIAGNOSIS — J302 Other seasonal allergic rhinitis: Secondary | ICD-10-CM

## 2024-01-24 DIAGNOSIS — M9903 Segmental and somatic dysfunction of lumbar region: Secondary | ICD-10-CM | POA: Diagnosis not present

## 2024-01-24 DIAGNOSIS — M9905 Segmental and somatic dysfunction of pelvic region: Secondary | ICD-10-CM | POA: Diagnosis not present

## 2024-01-24 DIAGNOSIS — M9902 Segmental and somatic dysfunction of thoracic region: Secondary | ICD-10-CM | POA: Diagnosis not present

## 2024-01-24 DIAGNOSIS — M9901 Segmental and somatic dysfunction of cervical region: Secondary | ICD-10-CM | POA: Diagnosis not present

## 2024-02-06 ENCOUNTER — Other Ambulatory Visit: Payer: Self-pay | Admitting: Family

## 2024-02-06 DIAGNOSIS — J302 Other seasonal allergic rhinitis: Secondary | ICD-10-CM

## 2024-02-13 ENCOUNTER — Telehealth: Payer: Self-pay

## 2024-02-13 ENCOUNTER — Ambulatory Visit: Admitting: Family

## 2024-02-13 ENCOUNTER — Encounter: Payer: Self-pay | Admitting: Family

## 2024-02-13 ENCOUNTER — Ambulatory Visit: Payer: Medicare PPO

## 2024-02-13 VITALS — BP 134/82 | HR 65 | Temp 98.0°F | Ht 58.5 in | Wt 142.6 lb

## 2024-02-13 VITALS — Ht 58.5 in | Wt 140.0 lb

## 2024-02-13 DIAGNOSIS — E7800 Pure hypercholesterolemia, unspecified: Secondary | ICD-10-CM

## 2024-02-13 DIAGNOSIS — Z0000 Encounter for general adult medical examination without abnormal findings: Secondary | ICD-10-CM | POA: Diagnosis not present

## 2024-02-13 DIAGNOSIS — Z860100 Personal history of colon polyps, unspecified: Secondary | ICD-10-CM | POA: Insufficient documentation

## 2024-02-13 DIAGNOSIS — K7581 Nonalcoholic steatohepatitis (NASH): Secondary | ICD-10-CM

## 2024-02-13 DIAGNOSIS — Z1231 Encounter for screening mammogram for malignant neoplasm of breast: Secondary | ICD-10-CM

## 2024-02-13 DIAGNOSIS — R7689 Other specified abnormal immunological findings in serum: Secondary | ICD-10-CM

## 2024-02-13 DIAGNOSIS — M2550 Pain in unspecified joint: Secondary | ICD-10-CM

## 2024-02-13 LAB — SEDIMENTATION RATE: Sed Rate: 12 mm/h (ref 0–30)

## 2024-02-13 LAB — COMPREHENSIVE METABOLIC PANEL WITH GFR
ALT: 43 U/L — ABNORMAL HIGH (ref 0–35)
AST: 33 U/L (ref 0–37)
Albumin: 5 g/dL (ref 3.5–5.2)
Alkaline Phosphatase: 41 U/L (ref 39–117)
BUN: 16 mg/dL (ref 6–23)
CO2: 26 meq/L (ref 19–32)
Calcium: 9.6 mg/dL (ref 8.4–10.5)
Chloride: 106 meq/L (ref 96–112)
Creatinine, Ser: 0.76 mg/dL (ref 0.40–1.20)
GFR: 77.87 mL/min (ref >60.00)
Glucose, Bld: 95 mg/dL (ref 70–99)
Potassium: 4 meq/L (ref 3.5–5.1)
Sodium: 138 meq/L (ref 135–145)
Total Bilirubin: 0.4 mg/dL (ref 0.2–1.2)
Total Protein: 7.6 g/dL (ref 6.0–8.3)

## 2024-02-13 LAB — C-REACTIVE PROTEIN: CRP: <0.5 mg/dL (ref 0.5–20.0)

## 2024-02-13 NOTE — Progress Notes (Signed)
 Chief Complaint  Patient presents with   Medicare Wellness     Subjective:   Vanessa Walton is a 73 y.o. female who presents for a Medicare Annual Wellness Visit.  Visit info / Clinical Intake: Medicare Wellness Visit Type:: Subsequent Annual Wellness Visit Persons participating in visit and providing information:: patient Medicare Wellness Visit Mode:: Telephone If telephone:: video declined Since this visit was completed virtually, some vitals may be partially provided or unavailable. Missing vitals are due to the limitations of the virtual format.: Unable to obtain vitals - no equipment If Telephone or Video please confirm:: I connected with patient using audio/video enable telemedicine. I verified patient identity with two identifiers, discussed telehealth limitations, and patient agreed to proceed. Patient Location:: home Provider Location:: home office Interpreter Needed?: No Pre-visit prep was completed: yes AWV questionnaire completed by patient prior to visit?: yes Date:: 02/11/24 Living arrangements:: (Patient-Rptd) lives with spouse/significant other Patient's Overall Health Status Rating: (Patient-Rptd) excellent Typical amount of pain: (Patient-Rptd) none Does pain affect daily life?: (Patient-Rptd) no Are you currently prescribed opioids?: no  Dietary Habits and Nutritional Risks How many meals a day?: (Patient-Rptd) 3 Eats fruit and vegetables daily?: (Patient-Rptd) yes Most meals are obtained by: (Patient-Rptd) preparing own meals In the last 2 weeks, have you had any of the following?: none Diabetic:: no  Functional Status Activities of Daily Living (to include ambulation/medication): (Patient-Rptd) Independent Ambulation: (Patient-Rptd) Independent Medication Administration: (Patient-Rptd) Independent Home Management (perform basic housework or laundry): (Patient-Rptd) Independent Manage your own finances?: (Patient-Rptd) yes Primary transportation is:  (Patient-Rptd) driving Concerns about vision?: no *vision screening is required for WTM* Concerns about hearing?: no  Fall Screening Falls in the past year?: (Patient-Rptd) 0 Number of falls in past year: 0 Was there an injury with Fall?: 0 Fall Risk Category Calculator: 0 Patient Fall Risk Level: Low Fall Risk  Fall Risk Patient at Risk for Falls Due to: No Fall Risks Fall risk Follow up: Education provided; Falls evaluation completed; Falls prevention discussed  Home and Transportation Safety: All rugs have non-skid backing?: (Patient-Rptd) yes All stairs or steps have railings?: (Patient-Rptd) yes Grab bars in the bathtub or shower?: (Patient-Rptd) yes Have non-skid surface in bathtub or shower?: (Patient-Rptd) yes Good home lighting?: (Patient-Rptd) yes Regular seat belt use?: (Patient-Rptd) yes Hospital stays in the last year:: (Patient-Rptd) no  Cognitive Assessment Difficulty concentrating, remembering, or making decisions? : (Patient-Rptd) no Will 6CIT or Mini Cog be Completed: yes What year is it?: 0 points What month is it?: 0 points Give patient an address phrase to remember (5 components): [address] [place]  About what time is it?: 0 points Count backwards from 20 to 1: 0 points Say the months of the year in reverse: 0 points Repeat the address phrase from earlier: 0 points 6 CIT Score: 0 points  Advance Directives (For Healthcare) Does Patient Have a Medical Advance Directive?: Yes Does patient want to make changes to medical advance directive?: No - Patient declined Type of Advance Directive: Living will; Healthcare Power of Attorney Copy of Healthcare Power of Attorney in Chart?: No - copy requested Copy of Living Will in Chart?: No - copy requested  Reviewed/Updated  Reviewed/Updated: Reviewed All (Medical, Surgical, Family, Medications, Allergies, Care Teams, Patient Goals)   Allergies (verified) Astelin [azelastine] and Flonase   [fluticasone ]   Current Medications (verified) Outpatient Encounter Medications as of 02/13/2024  Medication Sig   Biotin 89999 MCG TABS Take by mouth.   cetirizine  (ZYRTEC ) 10 MG tablet TAKE 1  TABLET BY MOUTH EVERY DAY   Cholecalciferol (VITAMIN D3) 50 MCG (2000 UT) TABS Take by mouth.   fluticasone  (FLOVENT  HFA) 110 MCG/ACT inhaler INHALE 1 PUFF INTO THE LUNGS IN THE MORNING AND AT BEDTIME.   GLUCOSAMINE CHONDROITIN COMPLX PO Take by mouth daily.   Probiotic Product (PROBIOTIC DAILY PO) Take by mouth.   rosuvastatin  (CRESTOR ) 10 MG tablet TAKE 1 TABLET BY MOUTH EVERYDAY AT BEDTIME   valACYclovir  (VALTREX ) 1000 MG tablet TAKE ONE BY MOUTH TWICE A DAY FOR FIVE DAYS AS NEEDED FOR OUTBREAK   XIIDRA 5 % SOLN    azithromycin  (ZITHROMAX ) 250 MG tablet Take 1 tablet (250 mg total) by mouth daily. Take first 2 tablets together, then 1 every day until finished.   B Complex Vitamins (B COMPLEX PO) Take by mouth daily.   benzonatate  (TESSALON ) 100 MG capsule Take 1 capsule (100 mg total) by mouth 3 (three) times daily as needed for cough.   Omega-3 Fatty Acids (FISH OIL DOUBLE STRENGTH) 1200 MG CAPS Take by mouth 2 (two) times daily.   predniSONE  (DELTASONE ) 20 MG tablet Take 2 tablets (40 mg total) by mouth daily with breakfast.   promethazine -dextromethorphan (PROMETHAZINE -DM) 6.25-15 MG/5ML syrup Take 5 mLs by mouth 4 (four) times daily as needed.   vitamin E 400 UNIT capsule Take 400 Units by mouth daily.   No facility-administered encounter medications on file as of 02/13/2024.    History: Past Medical History:  Diagnosis Date   Arthritis    right index finger   Basal cell carcinoma 02/24/2020   L nasal dorsum, MOHs 04/15/20   Dysplastic nevus 12/27/2020   Spinal mid upper back, Severe atypia, excised 03/14/2021   Dysplastic nevus 12/27/2020   Left Lower Back, severe atypia, EXC 03/21/2021   GERD (gastroesophageal reflux disease)    Herpes simplex virus (HSV) infection     Hyperlipidemia    Meniere disease 02/13/2019   Meniere's disease    Past Surgical History:  Procedure Laterality Date   CESAREAN SECTION     COLONOSCOPY WITH PROPOFOL  N/A 08/22/2017   Procedure: COLONOSCOPY WITH PROPOFOL ;  Surgeon: Unk Corinn Skiff, MD;  Location: [facility];  Service: Endoscopy;  Laterality: N/A;   COLONOSCOPY WITH PROPOFOL  N/A 02/10/2022   Procedure: COLONOSCOPY WITH PROPOFOL ;  Surgeon: Unk Corinn Skiff, MD;  Location: [facility];  Service: Gastroenterology;  Laterality: N/A;   POLYPECTOMY  08/22/2017   Procedure: POLYPECTOMY;  Surgeon: Unk Corinn Skiff, MD;  Location: [facility];  Service: Endoscopy;;   TONSILLECTOMY     Family History  Problem Relation Age of Onset   Arthritis Mother    Hyperlipidemia Mother    Hyperlipidemia Father    Breast cancer Neg Hx    Social History   Occupational History   Occupation: retired    Associate Professor: [employer]  Tobacco Use   Smoking status: Never   Smokeless tobacco: Never   Tobacco comments:    smoked socially as teenager  Vaping Use   Vaping status: Never Used  Substance and Sexual Activity   Alcohol use: Yes    Alcohol/week: 6.0 standard drinks of alcohol    Types: 6 Glasses of wine per week    Comment: moderate   Drug use: No   Sexual activity: Yes    Partners: Male    Birth control/protection: None   Tobacco Counseling Counseling given: Not Answered Tobacco comments: smoked socially as teenager  SDOH Screenings   Food Insecurity: No Food Insecurity (02/11/2024)  Housing: Low  Risk  (02/11/2024)  Transportation Needs: No Transportation Needs (02/11/2024)  Utilities: Not At Risk (02/13/2024)  Alcohol Screen: Low Risk  (02/11/2024)  Depression (PHQ2-9): Low Risk  (02/13/2024)  Financial Resource Strain: Low Risk  (02/11/2024)  Physical Activity: Sufficiently Active (02/11/2024)  Social Connections: Socially Integrated (02/11/2024)  Stress: No Stress Concern Present (02/11/2024)  Tobacco  Use: Low Risk  (02/13/2024)  Health Literacy: Adequate Health Literacy (02/13/2024)   See flowsheets for full screening details  Depression Screen PHQ 2 & 9 Depression Scale- Over the past 2 weeks, how often have you been bothered by any of the following problems? Little interest or pleasure in doing things: 0 Feeling down, depressed, or hopeless (PHQ Adolescent also includes...irritable): 0 PHQ-2 Total Score: 0     Goals Addressed             This Visit's Progress    Patient Stated       Stay in excellent health            Objective:    Today's Vitals   02/13/24 0931  Weight: 140 lb (63.5 kg)  Height: 4' 10.5 (1.486 m)   Body mass index is 28.76 kg/m.  Hearing/Vision screen Vision Screening - Comments:: UTD w/visits to Dr Dingeldein (Jan 2026 scheduled) Immunizations and Health Maintenance Health Maintenance  Topic Date Due   COVID-19 Vaccine (8 - Pfizer risk 2025-26 season) 05/08/2024   Medicare Annual Wellness (AWV)  02/12/2025   Mammogram  08/19/2025   Colonoscopy  02/11/2027   DTaP/Tdap/Td (2 - Td or Tdap) 05/25/2027   Pneumococcal Vaccine: 50+ Years  Completed   Influenza Vaccine  Completed   Bone Density Scan  Completed   Hepatitis C Screening  Completed   Zoster Vaccines- Shingrix  Completed   Meningococcal B Vaccine  Aged Out        Assessment/Plan:  This is a routine wellness examination for Franceen.  Patient Care Team: Corwin Antu, FNP as PCP - General (Family Medicine) Dingeldein, Elspeth, MD (Ophthalmology)  I have personally reviewed and noted the following in the patients chart:   Medical and social history Use of alcohol, tobacco or illicit drugs  Current medications and supplements including opioid prescriptions. Functional ability and status Nutritional status Physical activity Advanced directives List of other physicians Hospitalizations, surgeries, and ER visits in previous 12 months Vitals Screenings to include  cognitive, depression, and falls Referrals and appointments  No orders of the defined types were placed in this encounter.  In addition, I have reviewed and discussed with patient certain preventive protocols, quality metrics, and best practice recommendations. A written personalized care plan for preventive services as well as general preventive health recommendations were provided to patient.   Erminio LITTIE Saris, LPN   [id number]    After Visit Summary: (MyChart) Due to this being a telephonic visit, the after visit summary with patients personalized plan was offered to patient via MyChart   Nurse Notes: No voiced or noted concerns at this time Patient advised to keep follow-up appointment with PCP (today) Appointment(s) made: (AWV/CPE 2026)

## 2024-02-13 NOTE — Patient Instructions (Addendum)
 Ms. Speckman,  Thank you for taking the time for your Medicare Wellness Visit. I appreciate your continued commitment to your health goals. Please review the care plan we discussed, and feel free to reach out if I can assist you further.  Please note that Annual Wellness Visits do not include a physical exam. Some assessments may be limited, especially if the visit was conducted virtually. If needed, we may recommend an in-person follow-up with your provider.  Ongoing Care Seeing your primary care provider every 3 to 6 months helps us  monitor your health and provide consistent, personalized care.   Referrals If a referral was made during today's visit and you haven't received any updates within two weeks, please contact the referred provider directly to check on the status.  Recommended Screenings:  Health Maintenance  Topic Date Due   COVID-19 Vaccine (8 - Pfizer risk 2025-26 season) 05/08/2024   Medicare Annual Wellness Visit  02/12/2025   Breast Cancer Screening  08/19/2025   Colon Cancer Screening  02/11/2027   DTaP/Tdap/Td vaccine (2 - Td or Tdap) 05/25/2027   Pneumococcal Vaccine for age over 29  Completed   Flu Shot  Completed   Osteoporosis screening with Bone Density Scan  Completed   Hepatitis C Screening  Completed   Zoster (Shingles) Vaccine  Completed   Meningitis B Vaccine  Aged Out       02/11/2024    7:48 PM  Advanced Directives  Does Patient Have a Medical Advance Directive? Yes  Type of Advance Directive Living will;Healthcare Power of Attorney  Does patient want to make changes to medical advance directive? No - Patient declined  Copy of Healthcare Power of Attorney in Chart? No - copy requested    Vision: Annual vision screenings are recommended for early detection of glaucoma, cataracts, and diabetic retinopathy. These exams can also reveal signs of chronic conditions such as diabetes and high blood pressure.  Dental: Annual dental screenings help detect  early signs of oral cancer, gum disease, and other conditions linked to overall health, including heart disease and diabetes.

## 2024-02-13 NOTE — Progress Notes (Unsigned)
 Subjective:  Patient ID: Vanessa Walton, female    DOB: October 05, 1950  Age: 73 y.o. MRN: [id number]  Patient Care Team: Corwin Antu, FNP as PCP - General (Family Medicine) Dingeldein, Elspeth, MD (Ophthalmology)   CC:  Chief Complaint  Patient presents with   Annual Exam    HPI Vanessa Walton is a 73 y.o. female who presents today for an annual physical exam. She reports consuming a general diet. Exercises regularly walks on a trail daily across the street from her place 4-5 times a week for 30 min or more at a time She generally feels well. She reports sleeping well. She does not have additional problems to discuss today.   Vision:Within last year Dental:Receives regular dental care  Mammogram: 08/20/23 [place]  Last pap: > 76 y/o  Colonoscopy:02/10/22 every five years  Bone density scan: 2023   Pt is without acute concerns.   Discussed the use of AI scribe software for clinical note transcription with the patient, who gave verbal consent to proceed.  History of Present Illness Vanessa Walton is a 73 year old female who presents for an annual physical exam.  She experiences stiffness in her fingers, particularly in the left middle finger, which she attributes to aging. This stiffness has not significantly worsened or affected her fine motor skills. There is a family history of osteoarthritis, with her mother and sister affected. Her sister has had significant issues requiring stents in her fingers.  She has a history of nonalcoholic steatohepatitis (NASH). She previously underwent a FibroSure test for NASH.  Her rosacea is managed with prescription medication from a dermatologist. She was initially unaware of the condition until diagnosed by a dermatologist after experiencing eye issues. She also has a history of blepharitis.  Her vertigo is currently well-managed with specific maneuvers learned from an audiologist.  She exercises regularly, walking four to five days a week on a  community trail, covering about four miles each session. She notes a decrease in activity since September due to travel and her mother's passing in October.  She sleeps well, tracking her sleep with a Fitbit, and aims to maintain a sleep score above 80. She uses a nasal spray to manage allergies, which has been helpful in reducing sneezing and snoring.  Last metabolic panel Lab Results  Component Value Date   GLUCOSE 90 10/21/2021   NA 137 10/21/2021   K 4.1 10/21/2021   CL 103 10/21/2021   CO2 23 10/21/2021   BUN 21 10/21/2021   CREATININE 0.80 12/21/2021   GFR 77.92 10/21/2021   CALCIUM  9.7 10/21/2021   PROT 6.8 05/10/2022   ALBUMIN 4.7 05/10/2022   BILITOT 0.2 05/10/2022   ALKPHOS 43 (L) 05/10/2022   AST 25 05/10/2022   ALT 23 05/10/2022     Advanced Directives Patient does have advanced directives. She does not have a copy in the electronic medical record.   DEPRESSION SCREENING    02/13/2024    9:27 AM 02/12/2023    9:31 AM 02/07/2022    2:03 PM 05/30/2020   11:17 AM 02/13/2019    9:41 AM 05/24/2017   11:51 AM 04/11/2016    3:25 PM  PHQ 2/9 Scores  PHQ - 2 Score 0 0 0 0 0 0 0     ROS: Negative unless specifically indicated above in HPI.    Current Outpatient Medications:    Biotin 10000 MCG TABS, Take by mouth., Disp: , Rfl:    cetirizine  (ZYRTEC ) 10 MG tablet, TAKE 1  TABLET BY MOUTH EVERY DAY, Disp: 90 tablet, Rfl: 0   Cholecalciferol (VITAMIN D3) 50 MCG (2000 UT) TABS, Take by mouth., Disp: , Rfl:    fluticasone  (FLOVENT  HFA) 110 MCG/ACT inhaler, INHALE 1 PUFF INTO THE LUNGS IN THE MORNING AND AT BEDTIME., Disp: 12 each, Rfl: 2   GLUCOSAMINE CHONDROITIN COMPLX PO, Take by mouth daily., Disp: , Rfl:    Probiotic Product (PROBIOTIC DAILY PO), Take by mouth., Disp: , Rfl:    rosuvastatin  (CRESTOR ) 10 MG tablet, TAKE 1 TABLET BY MOUTH EVERYDAY AT BEDTIME, Disp: 90 tablet, Rfl: 0   valACYclovir  (VALTREX ) 1000 MG tablet, TAKE ONE BY MOUTH TWICE A DAY FOR FIVE DAYS AS  NEEDED FOR OUTBREAK, Disp: 30 tablet, Rfl: 1   XIIDRA 5 % SOLN, , Disp: , Rfl:     Objective:    BP 134/82 (BP Location: Left Arm, Patient Position: Sitting, Cuff Size: Normal)   Pulse 65   Temp 98 F (36.7 C) (Temporal)   Ht 4' 10.5 (1.486 m)   Wt 142 lb 9.6 oz (64.7 kg)   SpO2 98%   BMI 29.30 kg/m   BP Readings from Last 3 Encounters:  02/13/24 134/82  12/03/23 122/80  09/19/23 112/76      Physical Exam Vitals reviewed.  Constitutional:      General: She is not in acute distress.    Appearance: Normal appearance. She is normal weight. She is not ill-appearing.  HENT:     Head: Normocephalic.     Right Ear: Tympanic membrane normal.     Left Ear: Tympanic membrane normal.     Nose: Nose normal.     Mouth/Throat:     Mouth: Mucous membranes are moist.  Eyes:     Extraocular Movements: Extraocular movements intact.     Pupils: Pupils are equal, round, and reactive to light.  Cardiovascular:     Rate and Rhythm: Normal rate and regular rhythm.  Pulmonary:     Effort: Pulmonary effort is normal.     Breath sounds: Normal breath sounds.  Abdominal:     General: Abdomen is flat. Bowel sounds are normal.     Palpations: Abdomen is soft.     Tenderness: There is no guarding or rebound.  Musculoskeletal:        General: Normal range of motion.     Cervical back: Normal range of motion.  Skin:    General: Skin is warm.     Capillary Refill: Capillary refill takes less than 2 seconds.  Neurological:     General: No focal deficit present.     Mental Status: She is alert.  Psychiatric:        Mood and Affect: Mood normal.        Behavior: Behavior normal.        Thought Content: Thought content normal.        Judgment: Judgment normal.       Results LABS ANA: Positive (05/2023) Hepatitis C: Negative (2024) FibroSure: NASH score 0.6, moderate NASH, moderate to severe steatosis (05/2023)  RADIOLOGY Mammogram: Normal (08/2023) Bone density: Normal  (2023)  DIAGNOSTIC Colonoscopy: Presence of colon polyps (02/2022)      Assessment & Plan:   Assessment and Plan Assessment & Plan Nonalcoholic steatohepatitis (NASH) Moderate NASH with a FibroSure score of 0.6, indicating moderate steatosis. Previous liver enzyme scores were good. Monitoring for progression to cirrhosis is necessary. - Ordered FibroSure test - Checked liver function tests - Follow up with GI for  NASH management  Osteoarthritis of the hands Stiffness and constriction in the index and middle fingers of the left hand, likely due to osteoarthritis. No significant impact on motor skills or daily activities. Family history of osteoarthritis noted. - Continue to monitor for worsening symptoms or additional joint issues  Positive antinuclear antibody (ANA) Positive ANA noted in March. No known family history of autoimmune diseases. Differential diagnosis includes rheumatoid arthritis or osteoarthritis due to stiffness complaints. - Repeated ANA test - Monitor for symptoms of autoimmune diseases  Personal history of colon polyps Colonoscopy in December 2023. Next colonoscopy could potentially be the last one due to age and polyp status.  Rosacea with blepharitis Rosacea managed with prescription medication. Blepharitis previously noted, but currently well-managed. - Continue current management for rosacea and blepharitis  General Health Maintenance Mammogram due next year. Bone density test due this year, to be done with next mammogram. Eye and dental exams up to date. Advanced directive in place. - Ordered mammogram for next year - Ordered bone density test to be done with next mammogram - Ensure eye and dental exams remain up to date -Patient Counseling(The following topics were reviewed):  Preventative care handout given to pt  Health maintenance and immunizations reviewed. Please refer to Health maintenance section. Pt advised on safe sex, wearing seatbelts in  car, and proper nutrition labwork ordered today for annual Dental health: Discussed importance of regular tooth brushing, flossing, and dental visits.   Recording duration: 19 minutes Assessment and Plan Assessment & Plan          Follow-up: Return in about 1 year (around 02/12/2025) for f/u CPE.   Ginger Patrick, FNP

## 2024-02-13 NOTE — Telephone Encounter (Signed)
 The patient called to schedule an appointment. Her PCP completed several tests and advised her to follow up with her GI provider. I scheduled her with Mrs. Edwards on March 14, 2023, at 8:30 a.m.

## 2024-02-15 LAB — ENA+DNA/DS+ANTICH+CENTRO+FA...
Anti JO-1: <0.2 AI (ref 0.0–0.9)
Antiribosomal P Antibodies: <0.2 AI (ref 0.0–0.9)
Centromere Ab Screen: <0.2 AI (ref 0.0–0.9)
Chromatin Ab SerPl-aCnc: <0.2 AI (ref 0.0–0.9)
ENA RNP Ab: 3.6 AI — ABNORMAL HIGH (ref 0.0–0.9)
ENA SM Ab Ser-aCnc: <0.2 AI (ref 0.0–0.9)
ENA SSA (RO) Ab: 0.3 AI (ref 0.0–0.9)
ENA SSB (LA) Ab: <0.2 AI (ref 0.0–0.9)
Scleroderma (Scl-70) (ENA) Antibody, IgG: 2.3 AI — ABNORMAL HIGH (ref 0.0–0.9)
Smith/RNP Antibodies: <0.2 AI (ref 0.0–0.9)
Speckled Pattern: 1:80 {titer}
dsDNA Ab: <1 [IU]/mL (ref 0–9)

## 2024-02-15 LAB — NASH FIBROSURE(R) PLUS
ALPHA 2-MACROGLOBULINS, QN: 209 mg/dL (ref 110–276)
ALT (SGPT) P5P: 57 IU/L — ABNORMAL HIGH (ref 0–40)
AST (SGOT) P5P: 40 IU/L (ref 0–40)
Apolipoprotein A-1: 228 mg/dL — ABNORMAL HIGH (ref 116–209)
Bilirubin, Total: 0.3 mg/dL (ref 0.0–1.2)
Cholesterol, Total: 204 mg/dL — ABNORMAL HIGH (ref 100–199)
Fibrosis Score: 0.1 (ref 0.00–0.21)
GGT: 27 IU/L (ref 0–60)
Glucose: 98 mg/dL (ref 70–99)
Haptoglobin: 137 mg/dL (ref 42–346)
NASH Score: 0.73 — ABNORMAL HIGH (ref 0.00–0.25)
Steatosis Score: 0.44 — ABNORMAL HIGH (ref 0.00–0.40)
Triglycerides: 117 mg/dL (ref 0–149)

## 2024-02-15 LAB — ANA W/REFLEX: ANA Titer 1: POSITIVE — AB

## 2024-02-18 ENCOUNTER — Ambulatory Visit: Payer: Self-pay | Admitting: Family

## 2024-02-18 DIAGNOSIS — R7689 Other specified abnormal immunological findings in serum: Secondary | ICD-10-CM

## 2024-02-18 DIAGNOSIS — M2550 Pain in unspecified joint: Secondary | ICD-10-CM

## 2024-02-21 ENCOUNTER — Telehealth: Payer: Self-pay

## 2024-02-21 NOTE — Telephone Encounter (Signed)
 Patient called and left message requesting refills of skin medicinals cream she is using for Rosacea. She was last seen in May 2025 and has follow up in June 2026. Renewed rx for patient and sent a years worth of refills.  Patient notified of this information.

## 2024-03-04 ENCOUNTER — Telehealth

## 2024-03-04 DIAGNOSIS — J101 Influenza due to other identified influenza virus with other respiratory manifestations: Secondary | ICD-10-CM | POA: Diagnosis not present

## 2024-03-04 DIAGNOSIS — J4531 Mild persistent asthma with (acute) exacerbation: Secondary | ICD-10-CM

## 2024-03-04 MED ORDER — ALBUTEROL SULFATE HFA 108 (90 BASE) MCG/ACT IN AERS: 2.0000 | INHALATION_SPRAY | Freq: Four times a day (QID) | RESPIRATORY_TRACT | 0 refills | Status: AC | PRN

## 2024-03-04 MED ORDER — OSELTAMIVIR PHOSPHATE 75 MG PO CAPS: 75.0000 mg | ORAL_CAPSULE | Freq: Two times a day (BID) | ORAL | 0 refills | Status: DC

## 2024-03-04 MED ORDER — PREDNISONE 20 MG PO TABS: 40.0000 mg | ORAL_TABLET | Freq: Every day | ORAL | 0 refills | Status: AC

## 2024-03-04 NOTE — Patient Instructions (Signed)
 " Mercer Agent, thank you for joining Jon CHRISTELLA Belt, NP for today's virtual visit.  While this provider is not your primary care provider (PCP), if your PCP is located in our provider database this encounter information will be shared with them immediately following your visit.   A [facility] MyChart account gives you access to today's visit and all your visits, tests, and labs performed at [facility]  click here if you don't have a [facility] MyChart account or go to [email or web address]  Consent: (Patient) Vanessa Walton provided verbal consent for this virtual visit at the beginning of the encounter.  Current Medications:  Current Outpatient Medications:    albuterol (VENTOLIN HFA) 108 (90 Base) MCG/ACT inhaler, Inhale 2 puffs into the lungs every 6 (six) hours as needed for wheezing or shortness of breath., Disp: 17 each, Rfl: 0   oseltamivir (TAMIFLU) 75 MG capsule, Take 1 capsule (75 mg total) by mouth 2 (two) times daily., Disp: 10 capsule, Rfl: 0   predniSONE  (DELTASONE ) 20 MG tablet, Take 2 tablets (40 mg total) by mouth daily with breakfast for 5 days., Disp: 10 tablet, Rfl: 0   Biotin 89999 MCG TABS, Take by mouth., Disp: , Rfl:    cetirizine  (ZYRTEC ) 10 MG tablet, TAKE 1 TABLET BY MOUTH EVERY DAY, Disp: 90 tablet, Rfl: 0   Cholecalciferol (VITAMIN D3) 50 MCG (2000 UT) TABS, Take by mouth., Disp: , Rfl:    fluticasone  (FLOVENT  HFA) 110 MCG/ACT inhaler, INHALE 1 PUFF INTO THE LUNGS IN THE MORNING AND AT BEDTIME., Disp: 12 each, Rfl: 2   GLUCOSAMINE CHONDROITIN COMPLX PO, Take by mouth daily., Disp: , Rfl:    Probiotic Product (PROBIOTIC DAILY PO), Take by mouth., Disp: , Rfl:    rosuvastatin  (CRESTOR ) 10 MG tablet, TAKE 1 TABLET BY MOUTH EVERYDAY AT BEDTIME, Disp: 90 tablet, Rfl: 0   valACYclovir  (VALTREX ) 1000 MG tablet, TAKE ONE BY MOUTH TWICE A DAY FOR FIVE DAYS AS NEEDED FOR OUTBREAK, Disp: 30 tablet, Rfl: 1   XIIDRA 5 % SOLN, , Disp: , Rfl:    Medications  ordered in this encounter:  Meds ordered this encounter  Medications   oseltamivir (TAMIFLU) 75 MG capsule    Sig: Take 1 capsule (75 mg total) by mouth 2 (two) times daily.    Dispense:  10 capsule    Refill:  0   albuterol (VENTOLIN HFA) 108 (90 Base) MCG/ACT inhaler    Sig: Inhale 2 puffs into the lungs every 6 (six) hours as needed for wheezing or shortness of breath.    Dispense:  17 each    Refill:  0   predniSONE  (DELTASONE ) 20 MG tablet    Sig: Take 2 tablets (40 mg total) by mouth daily with breakfast for 5 days.    Dispense:  10 tablet    Refill:  0     *If you need refills on other medications prior to your next appointment, please contact your pharmacy*  Follow-Up: Call back or seek an in-person evaluation if the symptoms worsen or if the condition fails to improve as anticipated.  [facility] [phone]   If you have been instructed to have an in-person evaluation today at a local Urgent Care facility, please use the link below. It will take you to a list of all of our available [facility], including address, phone number and hours of operation. Please do not delay care.  [facility]  If you or  a family member do not have a primary care provider, use the link below to schedule a visit and establish care. When you choose a [facility] primary care physician or advanced practice provider, you gain a long-term partner in health. Find a Primary Care Provider  Learn more about [facility]'s in-office and virtual care options: [facility] - Get Care Now  "

## 2024-03-04 NOTE — Progress Notes (Signed)
 " Virtual Visit Consent   Vanessa Walton, you are scheduled for a virtual visit with a [facility] provider today. Just as with appointments in the office, your consent must be obtained to participate. Your consent will be active for this visit and any virtual visit you may have with one of our providers in the next 365 days. If you have a MyChart account, a copy of this consent can be sent to you electronically.  As this is a virtual visit, video technology does not allow for your provider to perform a traditional examination. This may limit your provider's ability to fully assess your condition. If your provider identifies any concerns that need to be evaluated in person or the need to arrange testing (such as labs, EKG, etc.), we will make arrangements to do so. Although advances in technology are sophisticated, we cannot ensure that it will always work on either your end or our end. If the connection with a video visit is poor, the visit may have to be switched to a telephone visit. With either a video or telephone visit, we are not always able to ensure that we have a secure connection.  By engaging in this virtual visit, you consent to the provision of healthcare and authorize for your insurance to be billed (if applicable) for the services provided during this visit. Depending on your insurance coverage, you may receive a charge related to this service.  I need to obtain your verbal consent now. Are you willing to proceed with your visit today? Vanessa Walton has provided verbal consent on 03/04/2024 for a virtual visit (video or telephone). Jon CHRISTELLA Belt, NP  Date: 03/04/2024 9:04 AM   Virtual Visit via Video Note   I, Jon CHRISTELLA Belt, connected with  Vanessa Walton  ([id number], 1950-07-02) on 03/04/2024 at  9:00 AM EST by a video-enabled telemedicine application and verified that I am speaking with the correct person using two identifiers.  Location: Patient: Virtual Visit Location Patient:  Home Provider: Virtual Visit Location Provider: Home Office   I discussed the limitations of evaluation and management by telemedicine and the availability of in person appointments. The patient expressed understanding and agreed to proceed.    History of Present Illness: Vanessa Walton is a 73 y.o. who identifies as a female who was assigned female at birth, and is being seen today for   Covid negative test yesterday.  Flu A positive today.   Coughing, body aches. Sx started 03/02/24.   No fever, bad headaches, nasal congestion, sinus pressure.   Delsym at night, zicam, vit C  Is feeling chest congestion and tightness. Does have flovent  but is out of rescue inhaler. 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  Resting comfortably  at home.  Head is normocephalic, atraumatic.  No labored breathing.  Speech is clear and coherent with  logical content.  Patient is alert and oriented at baseline.    Assessment and Plan: 1. Influenza A (Primary) - oseltamivir (TAMIFLU) 75 MG capsule; Take 1 capsule (75 mg total) by mouth 2 (two) times daily.  Dispense: 10 capsule; Refill: 0  2. Mild persistent asthma with exacerbation - albuterol (VENTOLIN HFA) 108 (90 Base) MCG/ACT inhaler;  Inhale 2 puffs into the lungs every 6 (six) hours as needed for wheezing or shortness of breath.  Dispense: 17 each; Refill: 0 - predniSONE  (DELTASONE ) 20 MG tablet; Take 2 tablets (40 mg total) by mouth daily with breakfast for 5 days.  Dispense: 10 tablet; Refill: 0  Asthma exacerbation likely due to flu sx. Is not severe exacerbation. Will add albuterol and prednisone    Follow Up Instructions: I discussed the assessment and treatment plan with the patient. The patient was provided an opportunity to ask questions and all were answered. The patient agreed with the plan and demonstrated an understanding of the instructions.  A copy of instructions were sent to the patient via MyChart unless otherwise noted below.   The patient was advised to call back or seek an in-person evaluation if the symptoms worsen or if the condition fails to improve as anticipated.    Jon CHRISTELLA Belt, NP    [1] No Known Allergies [2]  Current Outpatient Medications:    albuterol (VENTOLIN HFA) 108 (90 Base) MCG/ACT inhaler, Inhale 2 puffs into the lungs every 6 (six) hours as needed for wheezing or shortness of breath., Disp: 17 each, Rfl: 0   oseltamivir (TAMIFLU) 75 MG capsule, Take 1 capsule (75 mg total) by mouth 2 (two) times daily., Disp: 10 capsule, Rfl: 0   predniSONE  (DELTASONE ) 20 MG tablet, Take 2 tablets (40 mg total) by mouth daily with breakfast for 5 days., Disp: 10 tablet, Rfl: 0   Biotin 89999 MCG TABS, Take by mouth., Disp: , Rfl:    cetirizine  (ZYRTEC ) 10 MG tablet, TAKE 1 TABLET BY MOUTH EVERY DAY, Disp: 90 tablet, Rfl: 0   Cholecalciferol (VITAMIN D3) 50 MCG (2000 UT) TABS, Take by mouth., Disp: , Rfl:    fluticasone  (FLOVENT  HFA) 110 MCG/ACT inhaler, INHALE 1 PUFF INTO THE LUNGS IN THE MORNING AND AT BEDTIME., Disp: 12 each, Rfl: 2   GLUCOSAMINE CHONDROITIN COMPLX PO, Take by mouth daily., Disp: , Rfl:    Probiotic Product (PROBIOTIC DAILY PO), Take by mouth., Disp: , Rfl:    rosuvastatin   (CRESTOR ) 10 MG tablet, TAKE 1 TABLET BY MOUTH EVERYDAY AT BEDTIME, Disp: 90 tablet, Rfl: 0   valACYclovir  (VALTREX ) 1000 MG tablet, TAKE ONE BY MOUTH TWICE A DAY FOR FIVE DAYS AS NEEDED FOR OUTBREAK, Disp: 30 tablet, Rfl: 1   XIIDRA 5 % SOLN, , Disp: , Rfl:   "

## 2024-03-12 NOTE — Progress Notes (Unsigned)
 "   03/12/2024 Vanessa Walton [id number] 1950-03-31  Gastroenterology Office Note     Primary Care Physician:  Corwin Antu, FNP  Primary GI Provider: Jinny Carmine, MD    Chief Complaint   No chief complaint on file.    History of Present Illness   Vanessa Walton is a 74 y.o. female with PMHX of *** , presenting today   Patient last seen by Dr. Unk on 05/10/2022 for eleated LFTs. Recent imaging revealed steatosis with hepatomegaly. Patient has significantly cut down on drinking wine from 2 glasses/day to 2 to 3 glasses/week.  Colonoscopy 02/2022 1 subcentimeter polyp was removed DIAGNOSIS:  A. DESCENDING COLON, POLYP; COLD SNARE:  - FRAGMENTS OF HYPERPLASTIC POLYP.  - NEGATIVE FOR DYSPLASIA AND MALIGNANCY.       Past Medical History:  Diagnosis Date   Arthritis    right index finger   Basal cell carcinoma 02/24/2020   L nasal dorsum, MOHs 04/15/20   Dysplastic nevus 12/27/2020   Spinal mid upper back, Severe atypia, excised 03/14/2021   Dysplastic nevus 12/27/2020   Left Lower Back, severe atypia, EXC 03/21/2021   GERD (gastroesophageal reflux disease)    Herpes simplex virus (HSV) infection    Hyperlipidemia    Meniere disease 02/13/2019   Meniere's disease     Past Surgical History:  Procedure Laterality Date   CESAREAN SECTION     COLONOSCOPY WITH PROPOFOL  N/A 08/22/2017   Procedure: COLONOSCOPY WITH PROPOFOL ;  Surgeon: Unk Corinn Skiff, MD;  Location: [facility];  Service: Endoscopy;  Laterality: N/A;   COLONOSCOPY WITH PROPOFOL  N/A 02/10/2022   Procedure: COLONOSCOPY WITH PROPOFOL ;  Surgeon: Unk Corinn Skiff, MD;  Location: [facility] END[facility];  Service: Gastroenterology;  Laterality: N/A;   POLYPECTOMY  08/22/2017   Procedure: POLYPECTOMY;  Surgeon: Unk Corinn Skiff, MD;  Location: [facility];  Service: Endoscopy;;   TONSILLECTOMY      Current Outpatient Medications  Medication Sig Dispense Refill   albuterol  (VENTOLIN  HFA)  108 (90 Base) MCG/ACT inhaler Inhale 2 puffs into the lungs every 6 (six) hours as needed for wheezing or shortness of breath. 17 each 0   Biotin 89999 MCG TABS Take by mouth.     cetirizine  (ZYRTEC ) 10 MG tablet TAKE 1 TABLET BY MOUTH EVERY DAY 90 tablet 0   Cholecalciferol (VITAMIN D3) 50 MCG (2000 UT) TABS Take by mouth.     fluticasone  (FLOVENT  HFA) 110 MCG/ACT inhaler INHALE 1 PUFF INTO THE LUNGS IN THE MORNING AND AT BEDTIME. 12 each 2   GLUCOSAMINE CHONDROITIN COMPLX PO Take by mouth daily.     oseltamivir  (TAMIFLU ) 75 MG capsule Take 1 capsule (75 mg total) by mouth 2 (two) times daily. 10 capsule 0   Probiotic Product (PROBIOTIC DAILY PO) Take by mouth.     rosuvastatin  (CRESTOR ) 10 MG tablet TAKE 1 TABLET BY MOUTH EVERYDAY AT BEDTIME 90 tablet 0   valACYclovir  (VALTREX ) 1000 MG tablet TAKE ONE BY MOUTH TWICE A DAY FOR FIVE DAYS AS NEEDED FOR OUTBREAK 30 tablet 1   XIIDRA 5 % SOLN      No current facility-administered medications for this visit.    Allergies as of 03/13/2024   (No Known Allergies)    Family History  Problem Relation Age of Onset   Arthritis Mother    Hyperlipidemia Mother    Hyperlipidemia Father    Breast cancer Neg Hx     Social History   Socioeconomic History   Marital status: Married  Spouse name: Not on file   Number of children: 2   Years of education: Not on file   Highest education level: Doctorate  Occupational History   Occupation: retired    Associate Professor: OTHER  Tobacco Use   Smoking status: Never   Smokeless tobacco: Never   Tobacco comments:    smoked socially as teenager  Vaping Use   Vaping status: Never Used  Substance and Sexual Activity   Alcohol use: Yes    Alcohol/week: 6.0 standard drinks of alcohol    Types: 6 Glasses of wine per week    Comment: moderate   Drug use: No   Sexual activity: Yes    Partners: Male    Birth control/protection: None  Other Topics Concern   Not on file  Social History Narrative   Age 26  daughter np school   Age 46 female   Social Drivers of Health   Tobacco Use: Low Risk (02/13/2024)   Patient History    Smoking Tobacco Use: Never    Smokeless Tobacco Use: Never    Passive Exposure: Not on file  Financial Resource Strain: Low Risk (02/11/2024)   Overall Financial Resource Strain (CARDIA)    Difficulty of Paying Living Expenses: Not hard at all  Food Insecurity: No Food Insecurity (02/11/2024)   Epic    Worried About Radiation Protection Practitioner of Food in the Last Year: Never true    Ran Out of Food in the Last Year: Never true  Transportation Needs: No Transportation Needs (02/11/2024)   Epic    Lack of Transportation (Medical): No    Lack of Transportation (Non-Medical): No  Physical Activity: Sufficiently Active (02/11/2024)   Exercise Vital Sign    Days of Exercise per Week: 5 days    Minutes of Exercise per Session: 80 min  Stress: No Stress Concern Present (02/11/2024)   [employer] of Occupational Health - Occupational Stress Questionnaire    Feeling of Stress: Only a little  Social Connections: Socially Integrated (02/11/2024)   Social Connection and Isolation Panel    Frequency of Communication with Friends and Family: More than three times a week    Frequency of Social Gatherings with Friends and Family: Twice a week    Attends Religious Services: More than 4 times per year    Active Member of Clubs or Organizations: Yes    Attends Banker Meetings: More than 4 times per year    Marital Status: Married  Catering Manager Violence: Not At Risk (02/13/2024)   Epic    Fear of Current or Ex-Partner: No    Emotionally Abused: No    Physically Abused: No    Sexually Abused: No  Depression (PHQ2-9): Low Risk (02/13/2024)   Depression (PHQ2-9)    PHQ-2 Score: 0  Alcohol Screen: Low Risk (02/11/2024)   Alcohol Screen    Last Alcohol Screening Score (AUDIT): 2  Housing: Low Risk (02/11/2024)   Epic    Unable to Pay for Housing in the Last Year: No    Number  of Times Moved in the Last Year: 0    Homeless in the Last Year: No  Utilities: Not At Risk (02/13/2024)   Epic    Threatened with loss of utilities: No  Health Literacy: Adequate Health Literacy (02/13/2024)   B1300 Health Literacy    Frequency of need for help with medical instructions: Never     RELEVANT GI HISTORY, IMAGING AND LABS: CBC    Component Value Date/Time  WBC 5.0 10/21/2021 1039   RBC 4.03 10/21/2021 1039   HGB 12.9 10/21/2021 1039   HCT 38.8 10/21/2021 1039   PLT 169.0 10/21/2021 1039   MCV 96.2 10/21/2021 1039   MCH 32.1 05/28/2020 1426   MCHC 33.4 10/21/2021 1039   RDW 13.2 10/21/2021 1039   LYMPHSABS 1.4 10/21/2021 1039   MONOABS 0.5 10/21/2021 1039   EOSABS 0.1 10/21/2021 1039   BASOSABS 0.1 10/21/2021 1039   No results for input(s): HGB in the last 8760 hours.  CMP     Component Value Date/Time   NA 138 02/13/2024 1144   K 4.0 02/13/2024 1144   CL 106 02/13/2024 1144   CO2 26 02/13/2024 1144   GLUCOSE 95 02/13/2024 1144   BUN 16 02/13/2024 1144   CREATININE 0.76 02/13/2024 1144   CREATININE 0.75 05/28/2020 1426   CALCIUM  9.6 02/13/2024 1144   PROT 7.6 02/13/2024 1144   PROT 6.8 05/10/2022 1445   ALBUMIN 5.0 02/13/2024 1144   ALBUMIN 4.7 05/10/2022 1445   AST 33 02/13/2024 1144   ALT 43 (H) 02/13/2024 1144   ALKPHOS 41 02/13/2024 1144   BILITOT 0.4 02/13/2024 1144   BILITOT 0.2 05/10/2022 1445      Latest Ref Rng & Units 02/13/2024   11:44 AM 05/10/2022    2:45 PM 10/21/2021   10:39 AM  Hepatic Function  Total Protein 6.0 - 8.3 g/dL 7.6  6.8  7.4   Albumin 3.5 - 5.2 g/dL 5.0  4.7  4.6   AST 0 - 37 U/L 33  25  33   ALT 0 - 35 U/L 43  23  36   Alk Phosphatase 39 - 117 U/L 41  43  33   Total Bilirubin 0.2 - 1.2 mg/dL 0.4  0.2  0.5   Bilirubin, Direct 0.00 - 0.40 mg/dL  <9.89        Latest Ref Rng & Units 04/11/2016    4:08 PM 10/21/2021   10:39 AM  Hepatitis C  HCV Quanitative NOT DETECTED IU/mL <15 NOT DETECTED    HCV Quanitative  Log log IU/mL <1.18 NOT DETECTED  <1.18     Review of Systems   All systems reviewed and negative except where noted in HPI.    Physical Exam  There were no vitals taken for this visit. No LMP recorded. Patient is postmenopausal. General:   Alert and oriented. Pleasant and cooperative. Well-nourished and well-developed.  Head:  Normocephalic and atraumatic. Eyes:  Without icterus Ears:  Normal auditory acuity. Neck:  Supple; no masses or thyromegaly. Lungs:  Respirations even and unlabored.  Clear throughout to auscultation.   No wheezes, crackles, or rhonchi. No acute distress. Heart:  Regular rate and rhythm; no murmurs, clicks, rubs, or gallops. Abdomen:  Normal bowel sounds.  No bruits.  Soft, non-tender and non-distended without masses, hepatosplenomegaly or hernias noted.  No guarding or rebound tenderness.  ***Negative Carnett sign.   Rectal:  Deferred.***  Msk:  Symmetrical without gross deformities. Normal posture. Extremities:  Without edema. Neurologic:  Alert and  oriented x4;  grossly normal neurologically. Skin:  Intact without significant lesions or rashes. Psych:  Alert and cooperative. Normal mood and affect.   Assessment & Plan   Hanan Mcwilliams is a 74 y.o. female presenting today with     Grayce Bohr, DNP, AGNP-C [facility]   "

## 2024-03-13 ENCOUNTER — Ambulatory Visit: Admitting: Family Medicine

## 2024-03-17 NOTE — Progress Notes (Unsigned)
 "   03/12/2024 Vanessa Walton [id number] [birth year]  Gastroenterology Office Note     Primary Care Physician:  Corwin Antu, FNP  Primary GI Provider: Jinny Carmine, MD    Chief Complaint   No chief complaint on file. Fatty liver   History of Present Illness   Vanessa Walton is a 74 y.o. female with PMHX of *** , presenting today    Discussed the use of AI scribe software for clinical note transcription with the patient, who gave verbal consent to proceed.  Last evaluated in March 2024 for fatty liver and elevated liver enzymes. Fibrosure testing at that time showed no fibrosis and a fat score of 0.57. Repeat fibrosure last month demonstrated continued absence of fibrosis and a decreased fat score of 0.44. Liver enzymes have been monitored; ALT is currently very mildly elevated, while AST and alkaline phosphatase are within normal limits.  Autoimmune testing has included ANA, which was elevated both two years ago and on recent testing. She is scheduled to follow up with rheumatology in April for further evaluation. Currently taking curcumin 345 mg for joint soreness. Curcumin was discontinued for several months prior to her previous liver panel due to concerns about potential liver effects, but has since been resumed.  Dietary modifications include intentional reduction of meat intake and adherence to aspects of the Mediterranean diet. Alcohol consumption is episodic, limited to social occasions, and typically consists of two to three glasses of wine. She has made a conscious effort to reduce alcohol intake.  Immunity to hepatitis B is documented. Hepatitis A immunity status is unclear, as prior labs from two years ago showed no immunity and she is unsure of her vaccination history.      Has fu with rheumatology in April   Not a daiy drinker, episodic social wine drinker , 2-3 glasses of wine.  Has reduced meat intake.    Patient last seen by Dr. Unk on 05/10/2022 for elevated  LFTs. Recent imaging revealed steatosis with hepatomegaly. Patient has significantly cut down on drinking wine from 2 glasses/day to 2 to 3 glasses/week.  Colonoscopy 02/2022 1 subcentimeter polyp was removed DIAGNOSIS:  A. DESCENDING COLON, POLYP; COLD SNARE:  - FRAGMENTS OF HYPERPLASTIC POLYP.  - NEGATIVE FOR DYSPLASIA AND MALIGNANCY.  - REPEAT in 5 years     Past Medical History:  Diagnosis Date   Arthritis    right index finger   Basal cell carcinoma 02/24/2020   L nasal dorsum, MOHs 04/15/20   Dysplastic nevus 12/27/2020   Spinal mid upper back, Severe atypia, excised 03/14/2021   Dysplastic nevus 12/27/2020   Left Lower Back, severe atypia, EXC 03/21/2021   GERD (gastroesophageal reflux disease)    Herpes simplex virus (HSV) infection    Hyperlipidemia    Meniere disease 02/13/2019   Meniere's disease     Past Surgical History:  Procedure Laterality Date   CESAREAN SECTION     COLONOSCOPY WITH PROPOFOL  N/A 08/22/2017   Procedure: COLONOSCOPY WITH PROPOFOL ;  Surgeon: Unk Corinn Skiff, MD;  Location: [facility];  Service: Endoscopy;  Laterality: N/A;   COLONOSCOPY WITH PROPOFOL  N/A 02/10/2022   Procedure: COLONOSCOPY WITH PROPOFOL ;  Surgeon: Unk Corinn Skiff, MD;  Location: [facility];  Service: Gastroenterology;  Laterality: N/A;   POLYPECTOMY  08/22/2017   Procedure: POLYPECTOMY;  Surgeon: Unk Corinn Skiff, MD;  Location: [facility];  Service: Endoscopy;;   TONSILLECTOMY      Current Outpatient Medications  Medication Sig Dispense Refill   albuterol  (  VENTOLIN  HFA) 108 (90 Base) MCG/ACT inhaler Inhale 2 puffs into the lungs every 6 (six) hours as needed for wheezing or shortness of breath. 17 each 0   Biotin 89999 MCG TABS Take by mouth.     cetirizine  (ZYRTEC ) 10 MG tablet TAKE 1 TABLET BY MOUTH EVERY DAY 90 tablet 0   Cholecalciferol (VITAMIN D3) 50 MCG (2000 UT) TABS Take by mouth.     fluticasone  (FLOVENT  HFA) 110 MCG/ACT inhaler  INHALE 1 PUFF INTO THE LUNGS IN THE MORNING AND AT BEDTIME. 12 each 2   GLUCOSAMINE CHONDROITIN COMPLX PO Take by mouth daily.     oseltamivir  (TAMIFLU ) 75 MG capsule Take 1 capsule (75 mg total) by mouth 2 (two) times daily. 10 capsule 0   Probiotic Product (PROBIOTIC DAILY PO) Take by mouth.     rosuvastatin  (CRESTOR ) 10 MG tablet TAKE 1 TABLET BY MOUTH EVERYDAY AT BEDTIME 90 tablet 0   valACYclovir  (VALTREX ) 1000 MG tablet TAKE ONE BY MOUTH TWICE A DAY FOR FIVE DAYS AS NEEDED FOR OUTBREAK 30 tablet 1   XIIDRA 5 % SOLN      No current facility-administered medications for this visit.    Allergies as of 03/13/2024   (No Known Allergies)    Family History  Problem Relation Age of Onset   Arthritis Mother    Hyperlipidemia Mother    Hyperlipidemia Father    Breast cancer Neg Hx     Social History   Socioeconomic History   Marital status: Married    Spouse name: Not on file   Number of children: 2   Years of education: Not on file   Highest education level: Doctorate  Occupational History   Occupation: retired    Associate Professor: OTHER  Tobacco Use   Smoking status: Never   Smokeless tobacco: Never   Tobacco comments:    smoked socially as teenager  Vaping Use   Vaping status: Never Used  Substance and Sexual Activity   Alcohol use: Yes    Alcohol/week: 6.0 standard drinks of alcohol    Types: 6 Glasses of wine per week    Comment: moderate   Drug use: No   Sexual activity: Yes    Partners: Male    Birth control/protection: None  Other Topics Concern   Not on file  Social History Narrative   Age 46 daughter np school   Age 23 female   Social Drivers of Health   Tobacco Use: Low Risk (02/13/2024)   Patient History    Smoking Tobacco Use: Never    Smokeless Tobacco Use: Never    Passive Exposure: Not on file  Financial Resource Strain: Low Risk (02/11/2024)   Overall Financial Resource Strain (CARDIA)    Difficulty of Paying Living Expenses: Not hard at all  Food  Insecurity: No Food Insecurity (02/11/2024)   Epic    Worried About Radiation Protection Practitioner of Food in the Last Year: Never true    Ran Out of Food in the Last Year: Never true  Transportation Needs: No Transportation Needs (02/11/2024)   Epic    Lack of Transportation (Medical): No    Lack of Transportation (Non-Medical): No  Physical Activity: Sufficiently Active (02/11/2024)   Exercise Vital Sign    Days of Exercise per Week: 5 days    Minutes of Exercise per Session: 80 min  Stress: No Stress Concern Present (02/11/2024)   [employer] of Occupational Health - Occupational Stress Questionnaire    Feeling of Stress: Only a  little  Social Connections: Socially Integrated (02/11/2024)   Social Connection and Isolation Panel    Frequency of Communication with Friends and Family: More than three times a week    Frequency of Social Gatherings with Friends and Family: Twice a week    Attends Religious Services: More than 4 times per year    Active Member of Clubs or Organizations: Yes    Attends Banker Meetings: More than 4 times per year    Marital Status: Married  Catering Manager Violence: Not At Risk (02/13/2024)   Epic    Fear of Current or Ex-Partner: No    Emotionally Abused: No    Physically Abused: No    Sexually Abused: No  Depression (PHQ2-9): Low Risk (02/13/2024)   Depression (PHQ2-9)    PHQ-2 Score: 0  Alcohol Screen: Low Risk (02/11/2024)   Alcohol Screen    Last Alcohol Screening Score (AUDIT): 2  Housing: Low Risk (02/11/2024)   Epic    Unable to Pay for Housing in the Last Year: No    Number of Times Moved in the Last Year: 0    Homeless in the Last Year: No  Utilities: Not At Risk (02/13/2024)   Epic    Threatened with loss of utilities: No  Health Literacy: Adequate Health Literacy (02/13/2024)   B1300 Health Literacy    Frequency of need for help with medical instructions: Never     RELEVANT GI HISTORY, IMAGING AND LABS: CBC    Component Value  Date/Time   WBC 5.0 10/21/2021 1039   RBC 4.03 10/21/2021 1039   HGB 12.9 10/21/2021 1039   HCT 38.8 10/21/2021 1039   PLT 169.0 10/21/2021 1039   MCV 96.2 10/21/2021 1039   MCH 32.1 05/28/2020 1426   MCHC 33.4 10/21/2021 1039   RDW 13.2 10/21/2021 1039   LYMPHSABS 1.4 10/21/2021 1039   MONOABS 0.5 10/21/2021 1039   EOSABS 0.1 10/21/2021 1039   BASOSABS 0.1 10/21/2021 1039   No results for input(s): HGB in the last 8760 hours.  CMP     Component Value Date/Time   NA 138 02/13/2024 1144   K 4.0 02/13/2024 1144   CL 106 02/13/2024 1144   CO2 26 02/13/2024 1144   GLUCOSE 95 02/13/2024 1144   BUN 16 02/13/2024 1144   CREATININE 0.76 02/13/2024 1144   CREATININE 0.75 05/28/2020 1426   CALCIUM  9.6 02/13/2024 1144   PROT 7.6 02/13/2024 1144   PROT 6.8 05/10/2022 1445   ALBUMIN 5.0 02/13/2024 1144   ALBUMIN 4.7 05/10/2022 1445   AST 33 02/13/2024 1144   ALT 43 (H) 02/13/2024 1144   ALKPHOS 41 02/13/2024 1144   BILITOT 0.4 02/13/2024 1144   BILITOT 0.2 05/10/2022 1445      Latest Ref Rng & Units 02/13/2024   11:44 AM 05/10/2022    2:45 PM 10/21/2021   10:39 AM  Hepatic Function  Total Protein 6.0 - 8.3 g/dL 7.6  6.8  7.4   Albumin 3.5 - 5.2 g/dL 5.0  4.7  4.6   AST 0 - 37 U/L 33  25  33   ALT 0 - 35 U/L 43  23  36   Alk Phosphatase 39 - 117 U/L 41  43  33   Total Bilirubin 0.2 - 1.2 mg/dL 0.4  0.2  0.5   Bilirubin, Direct 0.00 - 0.40 mg/dL  <9.89        Latest Ref Rng & Units 04/11/2016    4:08 PM  10/21/2021   10:39 AM  Hepatitis C  HCV Quanitative NOT DETECTED IU/mL <15 NOT DETECTED    HCV Quanitative Log log IU/mL <1.18 NOT DETECTED  <1.18     Review of Systems   All systems reviewed and negative except where noted in HPI.    Physical Exam  There were no vitals taken for this visit. No LMP recorded. Patient is postmenopausal. General:   Alert and oriented. Pleasant and cooperative. Well-nourished and well-developed.  Head:  Normocephalic and  atraumatic. Eyes:  Without icterus Ears:  Normal auditory acuity. Neck:  Supple; no masses or thyromegaly. Lungs:  Respirations even and unlabored.  Clear throughout to auscultation.   No wheezes, crackles, or rhonchi. No acute distress. Heart:  Regular rate and rhythm; no murmurs, clicks, rubs, or gallops. Abdomen:  Normal bowel sounds.  No bruits.  Soft, non-tender and non-distended without masses, hepatosplenomegaly or hernias noted.  No guarding or rebound tenderness.  Rectal:  Deferred. Msk:  Symmetrical without gross deformities. Normal posture. Extremities:  Without edema. Neurologic:  Alert and  oriented x4;  grossly normal neurologically. Skin:  Intact without significant lesions or rashes. Psych:  Alert and cooperative. Normal mood and affect.   Assessment & Plan   Vanessa Walton is a 74 y.o. female presenting today with     Vanessa Bohr, DNP, AGNP-C [facility]  "

## 2024-03-18 ENCOUNTER — Ambulatory Visit: Admitting: Family Medicine

## 2024-03-18 ENCOUNTER — Encounter: Payer: Self-pay | Admitting: Family Medicine

## 2024-03-18 VITALS — BP 110/74 | HR 88 | Temp 97.7°F | Ht 58.5 in | Wt 140.6 lb

## 2024-03-18 DIAGNOSIS — R7401 Elevation of levels of liver transaminase levels: Secondary | ICD-10-CM | POA: Diagnosis not present

## 2024-03-18 DIAGNOSIS — K760 Fatty (change of) liver, not elsewhere classified: Secondary | ICD-10-CM

## 2024-03-18 DIAGNOSIS — R7989 Other specified abnormal findings of blood chemistry: Secondary | ICD-10-CM

## 2024-03-19 ENCOUNTER — Ambulatory Visit: Payer: Self-pay | Admitting: Family Medicine

## 2024-03-19 LAB — HEPATITIS A ANTIBODY, TOTAL: hep A Total Ab: NEGATIVE

## 2024-03-20 NOTE — Telephone Encounter (Signed)
 Left detailed message for the below- let me know if she wants to have the Vaccine and we can schedule a nurse visit.  No immunity to Hep A   Dorothe, please call and let her know that she can come in for a nurse visit to get vaccine. Thanks.    RE

## 2024-03-20 NOTE — Telephone Encounter (Signed)
 Patient scheduled to have Hep A vaccine 03/27/24 @ 10 am-nurse visit.

## 2024-03-26 ENCOUNTER — Encounter: Payer: Self-pay | Admitting: Family

## 2024-03-26 DIAGNOSIS — R7689 Other specified abnormal immunological findings in serum: Secondary | ICD-10-CM | POA: Insufficient documentation

## 2024-03-26 DIAGNOSIS — K760 Fatty (change of) liver, not elsewhere classified: Secondary | ICD-10-CM | POA: Insufficient documentation

## 2024-03-27 ENCOUNTER — Ambulatory Visit: Admitting: Family Medicine

## 2024-03-27 DIAGNOSIS — Z23 Encounter for immunization: Secondary | ICD-10-CM | POA: Diagnosis not present

## 2024-03-27 NOTE — Progress Notes (Signed)
 Patient was given Hep A vaccine (Havrix) LOT: [id number] EXP: 09/13/2024 on Right arm IM and she tolerated well. Patient was instructed to come back in 6 months for her second Hep A vaccine.

## 2024-06-03 ENCOUNTER — Ambulatory Visit

## 2024-08-05 ENCOUNTER — Encounter: Admitting: Dermatology

## 2024-09-24 ENCOUNTER — Ambulatory Visit

## 2025-02-18 ENCOUNTER — Encounter: Admitting: Family

## 2025-02-18 ENCOUNTER — Ambulatory Visit

## 2025-03-18 ENCOUNTER — Ambulatory Visit: Admitting: Family Medicine
